# Patient Record
Sex: Female | Born: 1935 | Race: Black or African American | Hispanic: No | Smoking: Never smoker
Health system: Southern US, Community
[De-identification: ages and names within clinical notes are randomized; demographics above are authoritative.]

## PROBLEM LIST (undated history)

## (undated) DIAGNOSIS — C9590 Leukemia, unspecified not having achieved remission: Secondary | ICD-10-CM

## (undated) DIAGNOSIS — I10 Essential (primary) hypertension: Secondary | ICD-10-CM

## (undated) DIAGNOSIS — Z923 Personal history of irradiation: Secondary | ICD-10-CM

## (undated) DIAGNOSIS — Z9221 Personal history of antineoplastic chemotherapy: Secondary | ICD-10-CM

## (undated) DIAGNOSIS — IMO0002 Reserved for concepts with insufficient information to code with codable children: Secondary | ICD-10-CM

## (undated) DIAGNOSIS — F0390 Unspecified dementia without behavioral disturbance: Secondary | ICD-10-CM

## (undated) DIAGNOSIS — K219 Gastro-esophageal reflux disease without esophagitis: Secondary | ICD-10-CM

## (undated) DIAGNOSIS — F419 Anxiety disorder, unspecified: Secondary | ICD-10-CM

## (undated) DIAGNOSIS — M810 Age-related osteoporosis without current pathological fracture: Secondary | ICD-10-CM

## (undated) DIAGNOSIS — J309 Allergic rhinitis, unspecified: Secondary | ICD-10-CM

## (undated) DIAGNOSIS — C50919 Malignant neoplasm of unspecified site of unspecified female breast: Secondary | ICD-10-CM

## (undated) DIAGNOSIS — E785 Hyperlipidemia, unspecified: Secondary | ICD-10-CM

## (undated) DIAGNOSIS — IMO0001 Reserved for inherently not codable concepts without codable children: Secondary | ICD-10-CM

## (undated) DIAGNOSIS — E559 Vitamin D deficiency, unspecified: Secondary | ICD-10-CM

## (undated) HISTORY — DX: Essential (primary) hypertension: I10

## (undated) HISTORY — DX: Anxiety disorder, unspecified: F41.9

## (undated) HISTORY — DX: Gastro-esophageal reflux disease without esophagitis: K21.9

## (undated) HISTORY — DX: Unspecified dementia, unspecified severity, without behavioral disturbance, psychotic disturbance, mood disturbance, and anxiety: F03.90

## (undated) HISTORY — DX: Reserved for inherently not codable concepts without codable children: IMO0001

## (undated) HISTORY — DX: Hyperlipidemia, unspecified: E78.5

## (undated) HISTORY — DX: Leukemia, unspecified not having achieved remission: C95.90

## (undated) HISTORY — DX: Vitamin D deficiency, unspecified: E55.9

## (undated) HISTORY — DX: Reserved for concepts with insufficient information to code with codable children: IMO0002

## (undated) HISTORY — DX: Age-related osteoporosis without current pathological fracture: M81.0

## (undated) HISTORY — DX: Allergic rhinitis, unspecified: J30.9

---

## 1982-09-13 HISTORY — PX: BACK SURGERY: SHX140

## 1997-12-16 ENCOUNTER — Other Ambulatory Visit: Admission: RE | Admit: 1997-12-16 | Discharge: 1997-12-16 | Payer: Self-pay | Admitting: *Deleted

## 1998-02-27 ENCOUNTER — Emergency Department (HOSPITAL_COMMUNITY): Admission: EM | Admit: 1998-02-27 | Discharge: 1998-02-27 | Payer: Self-pay | Admitting: Emergency Medicine

## 1998-08-21 ENCOUNTER — Inpatient Hospital Stay: Admission: EM | Admit: 1998-08-21 | Discharge: 1998-08-25 | Payer: Self-pay | Admitting: Emergency Medicine

## 2001-02-02 ENCOUNTER — Encounter: Admission: RE | Admit: 2001-02-02 | Discharge: 2001-02-02 | Payer: Self-pay | Admitting: Internal Medicine

## 2001-02-02 ENCOUNTER — Encounter: Payer: Self-pay | Admitting: Internal Medicine

## 2001-02-08 ENCOUNTER — Other Ambulatory Visit: Admission: RE | Admit: 2001-02-08 | Discharge: 2001-02-08 | Payer: Self-pay | Admitting: Internal Medicine

## 2001-02-25 ENCOUNTER — Emergency Department (HOSPITAL_COMMUNITY): Admission: EM | Admit: 2001-02-25 | Discharge: 2001-02-26 | Payer: Self-pay | Admitting: Emergency Medicine

## 2001-03-23 ENCOUNTER — Encounter: Payer: Self-pay | Admitting: Internal Medicine

## 2001-03-23 ENCOUNTER — Encounter: Admission: RE | Admit: 2001-03-23 | Discharge: 2001-03-23 | Payer: Self-pay | Admitting: Internal Medicine

## 2001-07-12 ENCOUNTER — Other Ambulatory Visit: Admission: RE | Admit: 2001-07-12 | Discharge: 2001-07-12 | Payer: Self-pay

## 2001-11-28 ENCOUNTER — Ambulatory Visit (HOSPITAL_COMMUNITY): Admission: RE | Admit: 2001-11-28 | Discharge: 2001-11-28 | Payer: Self-pay | Admitting: Gastroenterology

## 2002-09-28 ENCOUNTER — Encounter: Payer: Self-pay | Admitting: Internal Medicine

## 2002-09-28 ENCOUNTER — Encounter: Admission: RE | Admit: 2002-09-28 | Discharge: 2002-09-28 | Payer: Self-pay | Admitting: Internal Medicine

## 2003-05-28 ENCOUNTER — Other Ambulatory Visit: Admission: RE | Admit: 2003-05-28 | Discharge: 2003-05-28 | Payer: Self-pay | Admitting: Internal Medicine

## 2004-02-28 ENCOUNTER — Encounter: Admission: RE | Admit: 2004-02-28 | Discharge: 2004-02-28 | Payer: Self-pay | Admitting: Internal Medicine

## 2005-03-24 ENCOUNTER — Encounter: Admission: RE | Admit: 2005-03-24 | Discharge: 2005-03-24 | Payer: Self-pay | Admitting: Internal Medicine

## 2005-04-09 ENCOUNTER — Encounter: Admission: RE | Admit: 2005-04-09 | Discharge: 2005-04-09 | Payer: Self-pay | Admitting: Internal Medicine

## 2006-04-11 ENCOUNTER — Encounter: Admission: RE | Admit: 2006-04-11 | Discharge: 2006-04-11 | Payer: Self-pay | Admitting: Internal Medicine

## 2007-01-16 ENCOUNTER — Ambulatory Visit (HOSPITAL_COMMUNITY): Admission: RE | Admit: 2007-01-16 | Discharge: 2007-01-16 | Payer: Self-pay | Admitting: Gastroenterology

## 2007-01-16 ENCOUNTER — Encounter (INDEPENDENT_AMBULATORY_CARE_PROVIDER_SITE_OTHER): Payer: Self-pay | Admitting: Specialist

## 2007-05-11 ENCOUNTER — Encounter: Admission: RE | Admit: 2007-05-11 | Discharge: 2007-05-11 | Payer: Self-pay | Admitting: Internal Medicine

## 2008-05-13 ENCOUNTER — Encounter: Admission: RE | Admit: 2008-05-13 | Discharge: 2008-05-13 | Payer: Self-pay | Admitting: Internal Medicine

## 2008-09-13 DIAGNOSIS — C9590 Leukemia, unspecified not having achieved remission: Secondary | ICD-10-CM

## 2008-09-13 HISTORY — DX: Leukemia, unspecified not having achieved remission: C95.90

## 2009-02-12 ENCOUNTER — Ambulatory Visit: Payer: Self-pay | Admitting: Internal Medicine

## 2009-02-17 LAB — MANUAL DIFFERENTIAL
ALC: 5.2 10*3/uL — ABNORMAL HIGH (ref 0.9–3.3)
Band Neutrophils: 15 % — ABNORMAL HIGH (ref 0–10)
LYMPH: 9 % — ABNORMAL LOW (ref 14–49)
MONO: 1 % (ref 0–14)
Myelocytes: 18 % — ABNORMAL HIGH (ref 0–0)
Other Cell: 0 % (ref 0–0)
SEG: 34 % — ABNORMAL LOW (ref 38–77)
Variant Lymph: 0 % (ref 0–0)

## 2009-02-17 LAB — CBC WITH DIFFERENTIAL/PLATELET
HCT: 35 % (ref 34.8–46.6)
HGB: 11.5 g/dL — ABNORMAL LOW (ref 11.6–15.9)
Platelets: 731 10*3/uL — ABNORMAL HIGH (ref 145–400)
WBC: 58.3 10*3/uL (ref 3.9–10.3)

## 2009-02-17 LAB — COMPREHENSIVE METABOLIC PANEL
Albumin: 4.2 g/dL (ref 3.5–5.2)
Alkaline Phosphatase: 110 U/L (ref 39–117)
Glucose, Bld: 91 mg/dL (ref 70–99)
Potassium: 4.4 mEq/L (ref 3.5–5.3)
Sodium: 139 mEq/L (ref 135–145)
Total Protein: 7.1 g/dL (ref 6.0–8.3)

## 2009-02-17 LAB — CHCC SMEAR

## 2009-02-19 LAB — LEUKOCYTE ALKALINE PHOS: Leukocyte Alkaline  Phos Stain: 58 (ref 33–149)

## 2009-02-25 LAB — MANUAL DIFFERENTIAL
ALC: 2.7 10*3/uL (ref 0.9–3.3)
Band Neutrophils: 15 % — ABNORMAL HIGH (ref 0–10)
Blasts: 1 % — ABNORMAL HIGH (ref 0–0)
Myelocytes: 10 % — ABNORMAL HIGH (ref 0–0)
PLT EST: INCREASED
SEG: 45 % (ref 38–77)

## 2009-02-25 LAB — CBC WITH DIFFERENTIAL/PLATELET
HGB: 12.2 g/dL (ref 11.6–15.9)
MCV: 87.4 fL (ref 79.5–101.0)
Platelets: 839 10*3/uL — ABNORMAL HIGH (ref 145–400)
RBC: 4.09 10*6/uL (ref 3.70–5.45)
RDW: 21.5 % — ABNORMAL HIGH (ref 11.2–14.5)
WBC: 53.6 10*3/uL (ref 3.9–10.3)

## 2009-02-25 LAB — COMPREHENSIVE METABOLIC PANEL
AST: 20 U/L (ref 0–37)
Alkaline Phosphatase: 145 U/L — ABNORMAL HIGH (ref 39–117)
BUN: 11 mg/dL (ref 6–23)
Creatinine, Ser: 0.97 mg/dL (ref 0.40–1.20)
Glucose, Bld: 90 mg/dL (ref 70–99)
Total Bilirubin: 0.4 mg/dL (ref 0.3–1.2)

## 2009-02-25 LAB — URIC ACID: Uric Acid, Serum: 6.4 mg/dL (ref 2.4–7.0)

## 2009-02-25 LAB — FERRITIN: Ferritin: 494 ng/mL — ABNORMAL HIGH (ref 10–291)

## 2009-02-26 LAB — BCR/ABL

## 2009-03-06 LAB — MANUAL DIFFERENTIAL
ANC (CHCC manual diff): 41.2 10*3/uL — ABNORMAL HIGH (ref 1.5–6.5)
Band Neutrophils: 12 % — ABNORMAL HIGH (ref 0–10)
Blasts: 4 % — ABNORMAL HIGH (ref 0–0)
EOS: 5 % (ref 0–7)
Other Cell: 0 % (ref 0–0)
PLT EST: INCREASED
PROMYELO: 3 % — ABNORMAL HIGH (ref 0–0)
SEG: 32 % — ABNORMAL LOW (ref 38–77)
nRBC: 4 % — ABNORMAL HIGH (ref 0–0)

## 2009-03-06 LAB — CBC WITH DIFFERENTIAL/PLATELET
HCT: 33.3 % — ABNORMAL LOW (ref 34.8–46.6)
HGB: 11.1 g/dL — ABNORMAL LOW (ref 11.6–15.9)
Platelets: 672 10*3/uL — ABNORMAL HIGH (ref 145–400)
RBC: 3.92 10*6/uL (ref 3.70–5.45)
WBC: 56.5 10*3/uL (ref 3.9–10.3)

## 2009-03-06 LAB — URIC ACID: Uric Acid, Serum: 5.7 mg/dL (ref 2.4–7.0)

## 2009-03-06 LAB — LACTATE DEHYDROGENASE: LDH: 377 U/L — ABNORMAL HIGH (ref 94–250)

## 2009-03-14 ENCOUNTER — Encounter: Payer: Self-pay | Admitting: Internal Medicine

## 2009-03-14 ENCOUNTER — Ambulatory Visit: Payer: Self-pay

## 2009-03-14 ENCOUNTER — Encounter: Payer: Self-pay | Admitting: Cardiology

## 2009-03-18 ENCOUNTER — Ambulatory Visit: Payer: Self-pay | Admitting: Internal Medicine

## 2009-03-20 LAB — MANUAL DIFFERENTIAL
ANC (CHCC manual diff): 63.8 10*3/uL — ABNORMAL HIGH (ref 1.5–6.5)
Band Neutrophils: 5 % (ref 0–10)
Basophil: 9 % — ABNORMAL HIGH (ref 0–2)
Blasts: 1 % — ABNORMAL HIGH (ref 0–0)
EOS: 1 % (ref 0–7)
Metamyelocytes: 14 % — ABNORMAL HIGH (ref 0–0)
Myelocytes: 8 % — ABNORMAL HIGH (ref 0–0)
PLT EST: INCREASED
PROMYELO: 0 % (ref 0–0)
nRBC: 0 % (ref 0–0)

## 2009-03-20 LAB — CBC WITH DIFFERENTIAL/PLATELET
MCH: 29.2 pg (ref 25.1–34.0)
MCHC: 33.2 g/dL (ref 31.5–36.0)
MCV: 88.1 fL (ref 79.5–101.0)
RBC: 4.01 10*6/uL (ref 3.70–5.45)
RDW: 20.2 % — ABNORMAL HIGH (ref 11.2–14.5)

## 2009-03-20 LAB — COMPREHENSIVE METABOLIC PANEL
ALT: 17 U/L (ref 0–35)
AST: 19 U/L (ref 0–37)
Albumin: 4.4 g/dL (ref 3.5–5.2)
Alkaline Phosphatase: 104 U/L (ref 39–117)
BUN: 10 mg/dL (ref 6–23)
Creatinine, Ser: 0.73 mg/dL (ref 0.40–1.20)
Potassium: 2.9 mEq/L — ABNORMAL LOW (ref 3.5–5.3)

## 2009-03-20 LAB — URIC ACID: Uric Acid, Serum: 7.4 mg/dL — ABNORMAL HIGH (ref 2.4–7.0)

## 2009-03-26 LAB — MANUAL DIFFERENTIAL
ANC (CHCC manual diff): 30.9 10*3/uL — ABNORMAL HIGH (ref 1.5–6.5)
Band Neutrophils: 23 % — ABNORMAL HIGH (ref 0–10)
Basophil: 6 % — ABNORMAL HIGH (ref 0–2)
Blasts: 0 % (ref 0–0)
LYMPH: 13 % — ABNORMAL LOW (ref 14–49)
MONO: 2 % (ref 0–14)
Variant Lymph: 0 % (ref 0–0)
nRBC: 4 % — ABNORMAL HIGH (ref 0–0)

## 2009-03-26 LAB — CBC WITH DIFFERENTIAL/PLATELET
HCT: 36.3 % (ref 34.8–46.6)
MCHC: 33.3 g/dL (ref 31.5–36.0)
MCV: 85.4 fL (ref 79.5–101.0)
Platelets: 448 10*3/uL — ABNORMAL HIGH (ref 145–400)

## 2009-03-26 LAB — BASIC METABOLIC PANEL
BUN: 20 mg/dL (ref 6–23)
CO2: 26 mEq/L (ref 19–32)
Chloride: 101 mEq/L (ref 96–112)
Creatinine, Ser: 1.15 mg/dL (ref 0.40–1.20)

## 2009-04-02 LAB — CBC WITH DIFFERENTIAL/PLATELET
BASO%: 6.9 % — ABNORMAL HIGH (ref 0.0–2.0)
EOS%: 3.9 % (ref 0.0–7.0)
HCT: 34.3 % — ABNORMAL LOW (ref 34.8–46.6)
LYMPH%: 19.6 % (ref 14.0–49.7)
MCH: 28.5 pg (ref 25.1–34.0)
MCHC: 33.5 g/dL (ref 31.5–36.0)
NEUT%: 67.5 % (ref 38.4–76.8)
Platelets: 413 10*3/uL — ABNORMAL HIGH (ref 145–400)
RBC: 4.03 10*6/uL (ref 3.70–5.45)

## 2009-04-02 LAB — COMPREHENSIVE METABOLIC PANEL
BUN: 22 mg/dL (ref 6–23)
CO2: 19 mEq/L (ref 19–32)
Creatinine, Ser: 1.24 mg/dL — ABNORMAL HIGH (ref 0.40–1.20)
Glucose, Bld: 100 mg/dL — ABNORMAL HIGH (ref 70–99)
Total Bilirubin: 0.3 mg/dL (ref 0.3–1.2)

## 2009-04-02 LAB — URIC ACID: Uric Acid, Serum: 5.8 mg/dL (ref 2.4–7.0)

## 2009-04-16 LAB — COMPREHENSIVE METABOLIC PANEL
ALT: 13 U/L (ref 0–35)
AST: 18 U/L (ref 0–37)
Albumin: 4.2 g/dL (ref 3.5–5.2)
Alkaline Phosphatase: 106 U/L (ref 39–117)
Chloride: 105 mEq/L (ref 96–112)
Potassium: 3.1 mEq/L — ABNORMAL LOW (ref 3.5–5.3)
Sodium: 139 mEq/L (ref 135–145)
Total Protein: 6.7 g/dL (ref 6.0–8.3)

## 2009-04-16 LAB — MANUAL DIFFERENTIAL
ANC (CHCC manual diff): 2.8 10*3/uL (ref 1.5–6.5)
Basophil: 5 % — ABNORMAL HIGH (ref 0–2)
Blasts: 0 % (ref 0–0)
EOS: 4 % (ref 0–7)
Myelocytes: 0 % (ref 0–0)
PROMYELO: 0 % (ref 0–0)
SEG: 74 % (ref 38–77)

## 2009-04-16 LAB — CBC WITH DIFFERENTIAL/PLATELET
HCT: 32.2 % — ABNORMAL LOW (ref 34.8–46.6)
HGB: 10.8 g/dL — ABNORMAL LOW (ref 11.6–15.9)
MCV: 90.4 fL (ref 79.5–101.0)
Platelets: 128 10*3/uL — ABNORMAL LOW (ref 145–400)
WBC: 3.7 10*3/uL — ABNORMAL LOW (ref 3.9–10.3)

## 2009-04-25 ENCOUNTER — Ambulatory Visit: Payer: Self-pay | Admitting: Internal Medicine

## 2009-04-29 LAB — CBC WITH DIFFERENTIAL/PLATELET
BASO%: 6.1 % — ABNORMAL HIGH (ref 0.0–2.0)
HCT: 33.8 % — ABNORMAL LOW (ref 34.8–46.6)
LYMPH%: 23.5 % (ref 14.0–49.7)
MCH: 29.3 pg (ref 25.1–34.0)
MCHC: 34 g/dL (ref 31.5–36.0)
MCV: 86.2 fL (ref 79.5–101.0)
MONO#: 0.1 10*3/uL (ref 0.1–0.9)
MONO%: 5.1 % (ref 0.0–14.0)
NEUT%: 64.8 % (ref 38.4–76.8)
Platelets: 136 10*3/uL — ABNORMAL LOW (ref 145–400)

## 2009-04-29 LAB — URIC ACID: Uric Acid, Serum: 4.4 mg/dL (ref 2.4–7.0)

## 2009-04-30 LAB — COMPREHENSIVE METABOLIC PANEL
ALT: 14 U/L (ref 0–35)
Alkaline Phosphatase: 127 U/L — ABNORMAL HIGH (ref 39–117)
Creatinine, Ser: 0.95 mg/dL (ref 0.40–1.20)
Glucose, Bld: 101 mg/dL — ABNORMAL HIGH (ref 70–99)
Sodium: 139 mEq/L (ref 135–145)
Total Bilirubin: 0.3 mg/dL (ref 0.3–1.2)
Total Protein: 7 g/dL (ref 6.0–8.3)

## 2009-05-06 LAB — COMPREHENSIVE METABOLIC PANEL
BUN: 6 mg/dL (ref 6–23)
CO2: 22 mEq/L (ref 19–32)
Calcium: 8.4 mg/dL (ref 8.4–10.5)
Creatinine, Ser: 0.88 mg/dL (ref 0.40–1.20)
Glucose, Bld: 88 mg/dL (ref 70–99)
Total Bilirubin: 0.3 mg/dL (ref 0.3–1.2)

## 2009-05-06 LAB — CBC WITH DIFFERENTIAL/PLATELET
Basophils Absolute: 0.1 10*3/uL (ref 0.0–0.1)
Eosinophils Absolute: 0 10*3/uL (ref 0.0–0.5)
HCT: 35.7 % (ref 34.8–46.6)
LYMPH%: 31.5 % (ref 14.0–49.7)
MCV: 87.1 fL (ref 79.5–101.0)
MONO%: 3.8 % (ref 0.0–14.0)
NEUT#: 1 10*3/uL — ABNORMAL LOW (ref 1.5–6.5)
NEUT%: 56.5 % (ref 38.4–76.8)
Platelets: 163 10*3/uL (ref 145–400)
RBC: 4.1 10*6/uL (ref 3.70–5.45)
nRBC: 0 % (ref 0–0)

## 2009-05-13 LAB — COMPREHENSIVE METABOLIC PANEL
ALT: 13 U/L (ref 0–35)
AST: 18 U/L (ref 0–37)
Albumin: 4 g/dL (ref 3.5–5.2)
Alkaline Phosphatase: 113 U/L (ref 39–117)
BUN: 15 mg/dL (ref 6–23)
Calcium: 8.7 mg/dL (ref 8.4–10.5)
Chloride: 105 mEq/L (ref 96–112)
Potassium: 3.7 mEq/L (ref 3.5–5.3)
Sodium: 140 mEq/L (ref 135–145)
Total Protein: 6.7 g/dL (ref 6.0–8.3)

## 2009-05-13 LAB — CBC WITH DIFFERENTIAL/PLATELET
Eosinophils Absolute: 0.1 10*3/uL (ref 0.0–0.5)
HCT: 36.2 % (ref 34.8–46.6)
HGB: 12.2 g/dL (ref 11.6–15.9)
LYMPH%: 23 % (ref 14.0–49.7)
MONO#: 0.1 10*3/uL (ref 0.1–0.9)
NEUT#: 1.6 10*3/uL (ref 1.5–6.5)
NEUT%: 67.8 % (ref 38.4–76.8)
Platelets: 144 10*3/uL — ABNORMAL LOW (ref 145–400)
WBC: 2.3 10*3/uL — ABNORMAL LOW (ref 3.9–10.3)
lymph#: 0.5 10*3/uL — ABNORMAL LOW (ref 0.9–3.3)

## 2009-05-14 ENCOUNTER — Encounter: Admission: RE | Admit: 2009-05-14 | Discharge: 2009-05-14 | Payer: Self-pay | Admitting: Internal Medicine

## 2009-05-22 LAB — CBC WITH DIFFERENTIAL/PLATELET
BASO%: 3.5 % — ABNORMAL HIGH (ref 0.0–2.0)
EOS%: 2.2 % (ref 0.0–7.0)
HCT: 34.3 % — ABNORMAL LOW (ref 34.8–46.6)
LYMPH%: 32 % (ref 14.0–49.7)
MCH: 29.2 pg (ref 25.1–34.0)
MCHC: 33.8 g/dL (ref 31.5–36.0)
MONO%: 6.6 % (ref 0.0–14.0)
NEUT%: 55.7 % (ref 38.4–76.8)
Platelets: 134 10*3/uL — ABNORMAL LOW (ref 145–400)
RBC: 3.97 10*6/uL (ref 3.70–5.45)

## 2009-05-22 LAB — COMPREHENSIVE METABOLIC PANEL
ALT: 14 U/L (ref 0–35)
AST: 19 U/L (ref 0–37)
Alkaline Phosphatase: 101 U/L (ref 39–117)
Creatinine, Ser: 0.96 mg/dL (ref 0.40–1.20)
Sodium: 136 mEq/L (ref 135–145)
Total Bilirubin: 0.3 mg/dL (ref 0.3–1.2)

## 2009-05-22 LAB — LACTATE DEHYDROGENASE: LDH: 200 U/L (ref 94–250)

## 2009-06-03 ENCOUNTER — Ambulatory Visit: Payer: Self-pay | Admitting: Internal Medicine

## 2009-06-05 LAB — CBC WITH DIFFERENTIAL/PLATELET
Basophils Absolute: 0.1 10*3/uL (ref 0.0–0.1)
EOS%: 4.7 % (ref 0.0–7.0)
HGB: 12.5 g/dL (ref 11.6–15.9)
MCH: 29.1 pg (ref 25.1–34.0)
MCHC: 33.9 g/dL (ref 31.5–36.0)
MCV: 86 fL (ref 79.5–101.0)
MONO%: 8.7 % (ref 0.0–14.0)
NEUT%: 31.8 % — ABNORMAL LOW (ref 38.4–76.8)
RDW: 17.4 % — ABNORMAL HIGH (ref 11.2–14.5)

## 2009-06-05 LAB — COMPREHENSIVE METABOLIC PANEL
AST: 18 U/L (ref 0–37)
Alkaline Phosphatase: 91 U/L (ref 39–117)
BUN: 10 mg/dL (ref 6–23)
Creatinine, Ser: 0.94 mg/dL (ref 0.40–1.20)

## 2009-06-23 LAB — CBC WITH DIFFERENTIAL/PLATELET
BASO%: 1.2 % (ref 0.0–2.0)
EOS%: 1.2 % (ref 0.0–7.0)
HCT: 35.9 % (ref 34.8–46.6)
LYMPH%: 34.7 % (ref 14.0–49.7)
MCH: 29.2 pg (ref 25.1–34.0)
MCHC: 33.7 g/dL (ref 31.5–36.0)
MONO#: 0.1 10*3/uL (ref 0.1–0.9)
NEUT%: 58.3 % (ref 38.4–76.8)
RBC: 4.15 10*6/uL (ref 3.70–5.45)
WBC: 2.6 10*3/uL — ABNORMAL LOW (ref 3.9–10.3)
lymph#: 0.9 10*3/uL (ref 0.9–3.3)

## 2009-06-23 LAB — COMPREHENSIVE METABOLIC PANEL
ALT: 13 U/L (ref 0–35)
AST: 19 U/L (ref 0–37)
CO2: 25 mEq/L (ref 19–32)
Chloride: 101 mEq/L (ref 96–112)
Creatinine, Ser: 0.89 mg/dL (ref 0.40–1.20)
Sodium: 139 mEq/L (ref 135–145)
Total Bilirubin: 0.3 mg/dL (ref 0.3–1.2)
Total Protein: 6.9 g/dL (ref 6.0–8.3)

## 2009-07-10 ENCOUNTER — Ambulatory Visit: Payer: Self-pay | Admitting: Internal Medicine

## 2009-07-14 LAB — CBC WITH DIFFERENTIAL/PLATELET
BASO%: 0.4 % (ref 0.0–2.0)
EOS%: 1.1 % (ref 0.0–7.0)
HCT: 35.6 % (ref 34.8–46.6)
LYMPH%: 32.6 % (ref 14.0–49.7)
MCH: 29.3 pg (ref 25.1–34.0)
MCHC: 33.7 g/dL (ref 31.5–36.0)
MONO#: 0.2 10*3/uL (ref 0.1–0.9)
NEUT%: 59.5 % (ref 38.4–76.8)
Platelets: 195 10*3/uL (ref 145–400)
RBC: 4.09 10*6/uL (ref 3.70–5.45)
WBC: 2.6 10*3/uL — ABNORMAL LOW (ref 3.9–10.3)

## 2009-07-14 LAB — COMPREHENSIVE METABOLIC PANEL
ALT: 19 U/L (ref 0–35)
AST: 25 U/L (ref 0–37)
Alkaline Phosphatase: 103 U/L (ref 39–117)
Creatinine, Ser: 1 mg/dL (ref 0.40–1.20)
Sodium: 138 mEq/L (ref 135–145)
Total Bilirubin: 0.6 mg/dL (ref 0.3–1.2)
Total Protein: 7 g/dL (ref 6.0–8.3)

## 2009-08-04 LAB — COMPREHENSIVE METABOLIC PANEL
AST: 19 U/L (ref 0–37)
BUN: 10 mg/dL (ref 6–23)
Calcium: 9.4 mg/dL (ref 8.4–10.5)
Chloride: 104 mEq/L (ref 96–112)
Creatinine, Ser: 0.92 mg/dL (ref 0.40–1.20)
Total Bilirubin: 0.4 mg/dL (ref 0.3–1.2)

## 2009-08-04 LAB — CBC WITH DIFFERENTIAL/PLATELET
BASO%: 0.2 % (ref 0.0–2.0)
Basophils Absolute: 0 10*3/uL (ref 0.0–0.1)
EOS%: 0.2 % (ref 0.0–7.0)
HCT: 36 % (ref 34.8–46.6)
HGB: 12.2 g/dL (ref 11.6–15.9)
LYMPH%: 25.1 % (ref 14.0–49.7)
MCH: 29.3 pg (ref 25.1–34.0)
MCHC: 33.9 g/dL (ref 31.5–36.0)
MCV: 86.3 fL (ref 79.5–101.0)
MONO%: 3.9 % (ref 0.0–14.0)
NEUT%: 70.6 % (ref 38.4–76.8)
Platelets: 217 10*3/uL (ref 145–400)
lymph#: 1 10*3/uL (ref 0.9–3.3)

## 2009-08-29 ENCOUNTER — Ambulatory Visit: Payer: Self-pay | Admitting: Internal Medicine

## 2009-09-02 LAB — CBC WITH DIFFERENTIAL/PLATELET
Basophils Absolute: 0 10*3/uL (ref 0.0–0.1)
Eosinophils Absolute: 0 10*3/uL (ref 0.0–0.5)
HCT: 36.2 % (ref 34.8–46.6)
HGB: 12.1 g/dL (ref 11.6–15.9)
LYMPH%: 43.1 % (ref 14.0–49.7)
MCV: 89.4 fL (ref 79.5–101.0)
MONO#: 0.2 10*3/uL (ref 0.1–0.9)
MONO%: 7.1 % (ref 0.0–14.0)
NEUT#: 1.4 10*3/uL — ABNORMAL LOW (ref 1.5–6.5)
NEUT%: 48.1 % (ref 38.4–76.8)
Platelets: 203 10*3/uL (ref 145–400)
RBC: 4.05 10*6/uL (ref 3.70–5.45)
WBC: 3 10*3/uL — ABNORMAL LOW (ref 3.9–10.3)

## 2009-09-02 LAB — LACTATE DEHYDROGENASE: LDH: 219 U/L (ref 94–250)

## 2009-09-02 LAB — COMPREHENSIVE METABOLIC PANEL
BUN: 14 mg/dL (ref 6–23)
CO2: 26 mEq/L (ref 19–32)
Glucose, Bld: 91 mg/dL (ref 70–99)
Sodium: 140 mEq/L (ref 135–145)
Total Bilirubin: 0.5 mg/dL (ref 0.3–1.2)
Total Protein: 7 g/dL (ref 6.0–8.3)

## 2009-09-26 ENCOUNTER — Ambulatory Visit: Payer: Self-pay | Admitting: Internal Medicine

## 2009-09-30 LAB — COMPREHENSIVE METABOLIC PANEL
ALT: 13 U/L (ref 0–35)
AST: 19 U/L (ref 0–37)
Albumin: 4.7 g/dL (ref 3.5–5.2)
CO2: 23 mEq/L (ref 19–32)
Glucose, Bld: 112 mg/dL — ABNORMAL HIGH (ref 70–99)
Potassium: 3.7 mEq/L (ref 3.5–5.3)

## 2009-09-30 LAB — CBC WITH DIFFERENTIAL/PLATELET
MCHC: 33.9 g/dL (ref 31.5–36.0)
MONO#: 0.2 10*3/uL (ref 0.1–0.9)
MONO%: 7.7 % (ref 0.0–14.0)
NEUT#: 1.6 10*3/uL (ref 1.5–6.5)
NEUT%: 50.2 % (ref 38.4–76.8)
Platelets: 210 10*3/uL (ref 145–400)
RDW: 15.1 % — ABNORMAL HIGH (ref 11.2–14.5)
WBC: 3.1 10*3/uL — ABNORMAL LOW (ref 3.9–10.3)

## 2009-09-30 LAB — LACTATE DEHYDROGENASE: LDH: 214 U/L (ref 94–250)

## 2009-10-27 ENCOUNTER — Other Ambulatory Visit: Admission: RE | Admit: 2009-10-27 | Discharge: 2009-10-27 | Payer: Self-pay | Admitting: Internal Medicine

## 2009-10-30 ENCOUNTER — Ambulatory Visit: Payer: Self-pay | Admitting: Internal Medicine

## 2009-11-03 LAB — CBC WITH DIFFERENTIAL/PLATELET
Basophils Absolute: 0 10*3/uL (ref 0.0–0.1)
LYMPH%: 45.7 % (ref 14.0–49.7)
MCHC: 32.9 g/dL (ref 31.5–36.0)
MCV: 93 fL (ref 79.5–101.0)
MONO%: 5.4 % (ref 0.0–14.0)
NEUT#: 1.3 10*3/uL — ABNORMAL LOW (ref 1.5–6.5)
NEUT%: 47.1 % (ref 38.4–76.8)
Platelets: 217 10*3/uL (ref 145–400)
RBC: 3.86 10*6/uL (ref 3.70–5.45)
WBC: 2.8 10*3/uL — ABNORMAL LOW (ref 3.9–10.3)
lymph#: 1.3 10*3/uL (ref 0.9–3.3)

## 2009-11-03 LAB — COMPREHENSIVE METABOLIC PANEL
AST: 15 U/L (ref 0–37)
Alkaline Phosphatase: 74 U/L (ref 39–117)
BUN: 12 mg/dL (ref 6–23)
Chloride: 104 mEq/L (ref 96–112)
Glucose, Bld: 102 mg/dL — ABNORMAL HIGH (ref 70–99)
Potassium: 3.8 mEq/L (ref 3.5–5.3)
Sodium: 140 mEq/L (ref 135–145)
Total Protein: 6.8 g/dL (ref 6.0–8.3)

## 2009-12-04 ENCOUNTER — Ambulatory Visit: Payer: Self-pay | Admitting: Internal Medicine

## 2009-12-08 LAB — CBC WITH DIFFERENTIAL/PLATELET
BASO%: 0.5 % (ref 0.0–2.0)
EOS%: 1.3 % (ref 0.0–7.0)
Eosinophils Absolute: 0 10*3/uL (ref 0.0–0.5)
HGB: 11.4 g/dL — ABNORMAL LOW (ref 11.6–15.9)
MCH: 32.6 pg (ref 25.1–34.0)
MCV: 96 fL (ref 79.5–101.0)
MONO#: 0.2 10*3/uL (ref 0.1–0.9)
NEUT#: 2.1 10*3/uL (ref 1.5–6.5)
Platelets: 260 10*3/uL (ref 145–400)
RBC: 3.51 10*6/uL — ABNORMAL LOW (ref 3.70–5.45)
RDW: 14.4 % (ref 11.2–14.5)

## 2009-12-08 LAB — COMPREHENSIVE METABOLIC PANEL
Alkaline Phosphatase: 87 U/L (ref 39–117)
CO2: 27 mEq/L (ref 19–32)
Chloride: 107 mEq/L (ref 96–112)
Creatinine, Ser: 0.85 mg/dL (ref 0.40–1.20)
Glucose, Bld: 105 mg/dL — ABNORMAL HIGH (ref 70–99)
Sodium: 143 mEq/L (ref 135–145)
Total Bilirubin: 0.3 mg/dL (ref 0.3–1.2)
Total Protein: 6.3 g/dL (ref 6.0–8.3)

## 2010-01-05 ENCOUNTER — Ambulatory Visit: Payer: Self-pay | Admitting: Internal Medicine

## 2010-01-05 LAB — COMPREHENSIVE METABOLIC PANEL
AST: 21 U/L (ref 0–37)
Albumin: 4.6 g/dL (ref 3.5–5.2)
Alkaline Phosphatase: 85 U/L (ref 39–117)
CO2: 19 mEq/L (ref 19–32)
Chloride: 105 mEq/L (ref 96–112)
Glucose, Bld: 135 mg/dL — ABNORMAL HIGH (ref 70–99)
Total Protein: 7 g/dL (ref 6.0–8.3)

## 2010-01-05 LAB — CBC WITH DIFFERENTIAL/PLATELET
Basophils Absolute: 0 10*3/uL (ref 0.0–0.1)
LYMPH%: 29.1 % (ref 14.0–49.7)
MCH: 30.9 pg (ref 25.1–34.0)
MCHC: 33.6 g/dL (ref 31.5–36.0)
MCV: 91.9 fL (ref 79.5–101.0)
MONO#: 0.2 10*3/uL (ref 0.1–0.9)
MONO%: 4.2 % (ref 0.0–14.0)
lymph#: 1 10*3/uL (ref 0.9–3.3)
nRBC: 0 % (ref 0–0)

## 2010-01-05 LAB — LACTATE DEHYDROGENASE: LDH: 207 U/L (ref 94–250)

## 2010-02-02 LAB — COMPREHENSIVE METABOLIC PANEL
ALT: 11 U/L (ref 0–35)
AST: 17 U/L (ref 0–37)
Alkaline Phosphatase: 71 U/L (ref 39–117)
BUN: 7 mg/dL (ref 6–23)
CO2: 25 mEq/L (ref 19–32)
Chloride: 102 mEq/L (ref 96–112)
Glucose, Bld: 148 mg/dL — ABNORMAL HIGH (ref 70–99)
Potassium: 3.5 mEq/L (ref 3.5–5.3)
Total Bilirubin: 0.4 mg/dL (ref 0.3–1.2)

## 2010-02-02 LAB — CBC WITH DIFFERENTIAL/PLATELET
BASO%: 0.2 % (ref 0.0–2.0)
Basophils Absolute: 0 10*3/uL (ref 0.0–0.1)
EOS%: 1 % (ref 0.0–7.0)
HCT: 34.2 % — ABNORMAL LOW (ref 34.8–46.6)
HGB: 11.6 g/dL (ref 11.6–15.9)
MCHC: 33.8 g/dL (ref 31.5–36.0)
NEUT#: 1.6 10*3/uL (ref 1.5–6.5)
RBC: 3.63 10*6/uL — ABNORMAL LOW (ref 3.70–5.45)
RDW: 14.7 % — ABNORMAL HIGH (ref 11.2–14.5)
WBC: 2.6 10*3/uL — ABNORMAL LOW (ref 3.9–10.3)

## 2010-02-02 LAB — LACTATE DEHYDROGENASE: LDH: 200 U/L (ref 94–250)

## 2010-02-17 ENCOUNTER — Ambulatory Visit: Payer: Self-pay | Admitting: Internal Medicine

## 2010-02-19 LAB — CBC WITH DIFFERENTIAL/PLATELET
Basophils Absolute: 0 10*3/uL (ref 0.0–0.1)
EOS%: 2.8 % (ref 0.0–7.0)
Eosinophils Absolute: 0.1 10*3/uL (ref 0.0–0.5)
HCT: 35 % (ref 34.8–46.6)
HGB: 11.8 g/dL (ref 11.6–15.9)
MCH: 31.7 pg (ref 25.1–34.0)
MCHC: 33.7 g/dL (ref 31.5–36.0)
MONO#: 0.2 10*3/uL (ref 0.1–0.9)
MONO%: 6.2 % (ref 0.0–14.0)
Platelets: 279 10*3/uL (ref 145–400)

## 2010-02-26 LAB — BCR/ABL

## 2010-03-05 LAB — COMPREHENSIVE METABOLIC PANEL
Alkaline Phosphatase: 81 U/L (ref 39–117)
Calcium: 9.2 mg/dL (ref 8.4–10.5)
Chloride: 105 mEq/L (ref 96–112)
Creatinine, Ser: 0.98 mg/dL (ref 0.40–1.20)
Potassium: 4 mEq/L (ref 3.5–5.3)
Sodium: 137 mEq/L (ref 135–145)

## 2010-03-05 LAB — CBC WITH DIFFERENTIAL/PLATELET
BASO%: 0.3 % (ref 0.0–2.0)
EOS%: 1.4 % (ref 0.0–7.0)
HCT: 33.5 % — ABNORMAL LOW (ref 34.8–46.6)
MCH: 32.1 pg (ref 25.1–34.0)
MCHC: 33.9 g/dL (ref 31.5–36.0)
MCV: 94.6 fL (ref 79.5–101.0)
MONO#: 0.2 10*3/uL (ref 0.1–0.9)
NEUT#: 1.1 10*3/uL — ABNORMAL LOW (ref 1.5–6.5)
Platelets: 227 10*3/uL (ref 145–400)
lymph#: 1 10*3/uL (ref 0.9–3.3)

## 2010-03-31 ENCOUNTER — Ambulatory Visit: Payer: Self-pay | Admitting: Internal Medicine

## 2010-04-02 LAB — COMPREHENSIVE METABOLIC PANEL
BUN: 15 mg/dL (ref 6–23)
Chloride: 103 mEq/L (ref 96–112)
Glucose, Bld: 101 mg/dL — ABNORMAL HIGH (ref 70–99)
Potassium: 4.7 mEq/L (ref 3.5–5.3)
Sodium: 139 mEq/L (ref 135–145)
Total Protein: 6.8 g/dL (ref 6.0–8.3)

## 2010-04-02 LAB — CBC WITH DIFFERENTIAL/PLATELET
Basophils Absolute: 0 10*3/uL (ref 0.0–0.1)
EOS%: 3.8 % (ref 0.0–7.0)
MCH: 32.3 pg (ref 25.1–34.0)
MCV: 94.7 fL (ref 79.5–101.0)
MONO%: 4.9 % (ref 0.0–14.0)
NEUT%: 46.5 % (ref 38.4–76.8)
Platelets: 235 10*3/uL (ref 145–400)
RDW: 14 % (ref 11.2–14.5)
WBC: 2.7 10*3/uL — ABNORMAL LOW (ref 3.9–10.3)

## 2010-04-30 ENCOUNTER — Ambulatory Visit: Payer: Self-pay | Admitting: Internal Medicine

## 2010-04-30 LAB — CBC WITH DIFFERENTIAL/PLATELET
Basophils Absolute: 0 10*3/uL (ref 0.0–0.1)
EOS%: 1.9 % (ref 0.0–7.0)
Eosinophils Absolute: 0.1 10*3/uL (ref 0.0–0.5)
HGB: 11.3 g/dL — ABNORMAL LOW (ref 11.6–15.9)
MCHC: 33.1 g/dL (ref 31.5–36.0)
MONO#: 0.2 10*3/uL (ref 0.1–0.9)
NEUT#: 1.1 10*3/uL — ABNORMAL LOW (ref 1.5–6.5)
RBC: 3.7 10*6/uL (ref 3.70–5.45)
RDW: 14 % (ref 11.2–14.5)

## 2010-04-30 LAB — COMPREHENSIVE METABOLIC PANEL
ALT: 14 U/L (ref 0–35)
AST: 20 U/L (ref 0–37)
Albumin: 4.4 g/dL (ref 3.5–5.2)
BUN: 7 mg/dL (ref 6–23)
Calcium: 9.1 mg/dL (ref 8.4–10.5)
Chloride: 106 mEq/L (ref 96–112)
Creatinine, Ser: 1.02 mg/dL (ref 0.40–1.20)
Glucose, Bld: 90 mg/dL (ref 70–99)
Sodium: 143 mEq/L (ref 135–145)

## 2010-04-30 LAB — LACTATE DEHYDROGENASE: LDH: 213 U/L (ref 94–250)

## 2010-05-15 ENCOUNTER — Encounter: Admission: RE | Admit: 2010-05-15 | Discharge: 2010-05-15 | Payer: Self-pay | Admitting: Internal Medicine

## 2010-05-28 LAB — COMPREHENSIVE METABOLIC PANEL
Albumin: 4.4 g/dL (ref 3.5–5.2)
BUN: 12 mg/dL (ref 6–23)
CO2: 26 mEq/L (ref 19–32)
Calcium: 9.9 mg/dL (ref 8.4–10.5)
Chloride: 105 mEq/L (ref 96–112)
Glucose, Bld: 90 mg/dL (ref 70–99)
Potassium: 3.6 mEq/L (ref 3.5–5.3)

## 2010-05-28 LAB — CBC WITH DIFFERENTIAL/PLATELET
Basophils Absolute: 0 10*3/uL (ref 0.0–0.1)
LYMPH%: 42.7 % (ref 14.0–49.7)
MCHC: 33.7 g/dL (ref 31.5–36.0)
NEUT#: 1.1 10*3/uL — ABNORMAL LOW (ref 1.5–6.5)
RBC: 3.63 10*6/uL — ABNORMAL LOW (ref 3.70–5.45)
RDW: 14.4 % (ref 11.2–14.5)

## 2010-07-07 ENCOUNTER — Ambulatory Visit: Payer: Self-pay | Admitting: Internal Medicine

## 2010-07-09 LAB — CBC WITH DIFFERENTIAL/PLATELET
BASO%: 0.9 % (ref 0.0–2.0)
HCT: 33.6 % — ABNORMAL LOW (ref 34.8–46.6)
MCH: 30.5 pg (ref 25.1–34.0)
MCHC: 33 g/dL (ref 31.5–36.0)
NEUT%: 52 % (ref 38.4–76.8)
RBC: 3.64 10*6/uL — ABNORMAL LOW (ref 3.70–5.45)
RDW: 13.7 % (ref 11.2–14.5)
WBC: 3.2 10*3/uL — ABNORMAL LOW (ref 3.9–10.3)

## 2010-07-09 LAB — COMPREHENSIVE METABOLIC PANEL
AST: 17 U/L (ref 0–37)
BUN: 14 mg/dL (ref 6–23)
Calcium: 9 mg/dL (ref 8.4–10.5)
Chloride: 108 mEq/L (ref 96–112)
Glucose, Bld: 103 mg/dL — ABNORMAL HIGH (ref 70–99)
Potassium: 3.7 mEq/L (ref 3.5–5.3)
Total Bilirubin: 0.4 mg/dL (ref 0.3–1.2)
Total Protein: 6.1 g/dL (ref 6.0–8.3)

## 2010-08-05 ENCOUNTER — Ambulatory Visit: Payer: Self-pay | Admitting: Internal Medicine

## 2010-08-10 LAB — CBC WITH DIFFERENTIAL/PLATELET
BASO%: 1.3 % (ref 0.0–2.0)
EOS%: 2.1 % (ref 0.0–7.0)
HCT: 35 % (ref 34.8–46.6)
LYMPH%: 37.4 % (ref 14.0–49.7)
MCH: 32.3 pg (ref 25.1–34.0)
MCHC: 34.4 g/dL (ref 31.5–36.0)
NEUT%: 51.7 % (ref 38.4–76.8)
Platelets: 231 10*3/uL (ref 145–400)

## 2010-08-10 LAB — COMPREHENSIVE METABOLIC PANEL
ALT: 14 U/L (ref 0–35)
AST: 16 U/L (ref 0–37)
CO2: 25 mEq/L (ref 19–32)
Creatinine, Ser: 0.91 mg/dL (ref 0.40–1.20)
Total Bilirubin: 0.4 mg/dL (ref 0.3–1.2)

## 2010-08-10 LAB — LACTATE DEHYDROGENASE: LDH: 194 U/L (ref 94–250)

## 2010-09-04 ENCOUNTER — Ambulatory Visit: Payer: Self-pay | Admitting: Internal Medicine

## 2010-09-09 LAB — CBC WITH DIFFERENTIAL/PLATELET
BASO%: 0.3 % (ref 0.0–2.0)
Basophils Absolute: 0 10*3/uL (ref 0.0–0.1)
Eosinophils Absolute: 0.1 10*3/uL (ref 0.0–0.5)
HCT: 35.5 % (ref 34.8–46.6)
HGB: 11.9 g/dL (ref 11.6–15.9)
LYMPH%: 46.5 % (ref 14.0–49.7)
MONO#: 0.2 10*3/uL (ref 0.1–0.9)
NEUT#: 1.6 10*3/uL (ref 1.5–6.5)
NEUT%: 45.7 % (ref 38.4–76.8)
Platelets: 211 10*3/uL (ref 145–400)
nRBC: 0 % (ref 0–0)

## 2010-09-09 LAB — COMPREHENSIVE METABOLIC PANEL
AST: 20 U/L (ref 0–37)
Alkaline Phosphatase: 84 U/L (ref 39–117)
CO2: 25 mEq/L (ref 19–32)
Calcium: 9.7 mg/dL (ref 8.4–10.5)
Chloride: 103 mEq/L (ref 96–112)
Creatinine, Ser: 1.11 mg/dL (ref 0.40–1.20)
Potassium: 4 mEq/L (ref 3.5–5.3)
Sodium: 138 mEq/L (ref 135–145)
Total Protein: 6.9 g/dL (ref 6.0–8.3)

## 2010-10-04 ENCOUNTER — Encounter: Payer: Self-pay | Admitting: Internal Medicine

## 2010-10-05 ENCOUNTER — Ambulatory Visit: Payer: Self-pay | Admitting: Internal Medicine

## 2010-10-07 LAB — CBC WITH DIFFERENTIAL/PLATELET
BASO%: 0.9 % (ref 0.0–2.0)
EOS%: 5.5 % (ref 0.0–7.0)
Eosinophils Absolute: 0.2 10*3/uL (ref 0.0–0.5)
HGB: 11.6 g/dL (ref 11.6–15.9)
LYMPH%: 42.8 % (ref 14.0–49.7)
MCH: 31.1 pg (ref 25.1–34.0)
MCHC: 33.7 g/dL (ref 31.5–36.0)
MCV: 92.3 fL (ref 79.5–101.0)
MONO#: 0.3 10*3/uL (ref 0.1–0.9)
Platelets: 235 10*3/uL (ref 145–400)
RBC: 3.73 10*6/uL (ref 3.70–5.45)
RDW: 14.8 % — ABNORMAL HIGH (ref 11.2–14.5)
WBC: 3.4 10*3/uL — ABNORMAL LOW (ref 3.9–10.3)
lymph#: 1.5 10*3/uL (ref 0.9–3.3)

## 2010-10-07 LAB — COMPREHENSIVE METABOLIC PANEL
AST: 21 U/L (ref 0–37)
Alkaline Phosphatase: 76 U/L (ref 39–117)
BUN: 9 mg/dL (ref 6–23)
Creatinine, Ser: 0.87 mg/dL (ref 0.40–1.20)
Total Bilirubin: 0.5 mg/dL (ref 0.3–1.2)
Total Protein: 6.3 g/dL (ref 6.0–8.3)

## 2010-10-07 LAB — LACTATE DEHYDROGENASE: LDH: 198 U/L (ref 94–250)

## 2010-11-09 ENCOUNTER — Other Ambulatory Visit: Payer: Self-pay | Admitting: Internal Medicine

## 2010-11-09 ENCOUNTER — Encounter (HOSPITAL_BASED_OUTPATIENT_CLINIC_OR_DEPARTMENT_OTHER): Payer: MEDICARE | Admitting: Internal Medicine

## 2010-11-09 DIAGNOSIS — C9210 Chronic myeloid leukemia, BCR/ABL-positive, not having achieved remission: Secondary | ICD-10-CM

## 2010-11-09 DIAGNOSIS — I10 Essential (primary) hypertension: Secondary | ICD-10-CM

## 2010-11-09 LAB — CBC WITH DIFFERENTIAL/PLATELET
Basophils Absolute: 0 10*3/uL (ref 0.0–0.1)
EOS%: 2.9 % (ref 0.0–7.0)
Eosinophils Absolute: 0.1 10*3/uL (ref 0.0–0.5)
HCT: 34.5 % — ABNORMAL LOW (ref 34.8–46.6)
HGB: 11.5 g/dL — ABNORMAL LOW (ref 11.6–15.9)
MCH: 30.8 pg (ref 25.1–34.0)
NEUT#: 1.9 10*3/uL (ref 1.5–6.5)
NEUT%: 63 % (ref 38.4–76.8)
lymph#: 0.8 10*3/uL — ABNORMAL LOW (ref 0.9–3.3)

## 2010-11-09 LAB — COMPREHENSIVE METABOLIC PANEL
Albumin: 4.2 g/dL (ref 3.5–5.2)
BUN: 12 mg/dL (ref 6–23)
CO2: 27 mEq/L (ref 19–32)
Calcium: 9.7 mg/dL (ref 8.4–10.5)
Chloride: 102 mEq/L (ref 96–112)
Creatinine, Ser: 0.99 mg/dL (ref 0.40–1.20)
Glucose, Bld: 91 mg/dL (ref 70–99)

## 2010-11-09 LAB — LACTATE DEHYDROGENASE: LDH: 180 U/L (ref 94–250)

## 2010-12-08 ENCOUNTER — Other Ambulatory Visit: Payer: Self-pay | Admitting: Internal Medicine

## 2010-12-08 ENCOUNTER — Encounter (HOSPITAL_BASED_OUTPATIENT_CLINIC_OR_DEPARTMENT_OTHER): Payer: MEDICARE | Admitting: Internal Medicine

## 2010-12-08 DIAGNOSIS — I10 Essential (primary) hypertension: Secondary | ICD-10-CM

## 2010-12-08 DIAGNOSIS — C9210 Chronic myeloid leukemia, BCR/ABL-positive, not having achieved remission: Secondary | ICD-10-CM

## 2010-12-08 LAB — CBC WITH DIFFERENTIAL/PLATELET
Basophils Absolute: 0 10*3/uL (ref 0.0–0.1)
Eosinophils Absolute: 0.1 10*3/uL (ref 0.0–0.5)
HGB: 11.1 g/dL — ABNORMAL LOW (ref 11.6–15.9)
NEUT#: 1.5 10*3/uL (ref 1.5–6.5)
RBC: 3.49 10*6/uL — ABNORMAL LOW (ref 3.70–5.45)
RDW: 15.4 % — ABNORMAL HIGH (ref 11.2–14.5)
WBC: 3 10*3/uL — ABNORMAL LOW (ref 3.9–10.3)
lymph#: 1.2 10*3/uL (ref 0.9–3.3)
nRBC: 0 % (ref 0–0)

## 2010-12-08 LAB — COMPREHENSIVE METABOLIC PANEL
ALT: 12 U/L (ref 0–35)
Albumin: 4.4 g/dL (ref 3.5–5.2)
Alkaline Phosphatase: 80 U/L (ref 39–117)
CO2: 24 mEq/L (ref 19–32)
Glucose, Bld: 82 mg/dL (ref 70–99)
Potassium: 3.8 mEq/L (ref 3.5–5.3)
Sodium: 141 mEq/L (ref 135–145)
Total Bilirubin: 0.5 mg/dL (ref 0.3–1.2)
Total Protein: 6.9 g/dL (ref 6.0–8.3)

## 2011-01-11 ENCOUNTER — Encounter (HOSPITAL_BASED_OUTPATIENT_CLINIC_OR_DEPARTMENT_OTHER): Payer: MEDICARE | Admitting: Internal Medicine

## 2011-01-11 ENCOUNTER — Other Ambulatory Visit: Payer: Self-pay | Admitting: Internal Medicine

## 2011-01-11 DIAGNOSIS — I10 Essential (primary) hypertension: Secondary | ICD-10-CM

## 2011-01-11 DIAGNOSIS — C9210 Chronic myeloid leukemia, BCR/ABL-positive, not having achieved remission: Secondary | ICD-10-CM

## 2011-01-11 LAB — CBC WITH DIFFERENTIAL/PLATELET
Eosinophils Absolute: 0 10*3/uL (ref 0.0–0.5)
HCT: 33.4 % — ABNORMAL LOW (ref 34.8–46.6)
LYMPH%: 47.9 % (ref 14.0–49.7)
MCHC: 33.8 g/dL (ref 31.5–36.0)
MCV: 94.2 fL (ref 79.5–101.0)
MONO%: 7 % (ref 0.0–14.0)
NEUT#: 1.1 10*3/uL — ABNORMAL LOW (ref 1.5–6.5)
NEUT%: 43.5 % (ref 38.4–76.8)
Platelets: 223 10*3/uL (ref 145–400)
RBC: 3.55 10*6/uL — ABNORMAL LOW (ref 3.70–5.45)

## 2011-01-11 LAB — COMPREHENSIVE METABOLIC PANEL
Alkaline Phosphatase: 75 U/L (ref 39–117)
Creatinine, Ser: 1.01 mg/dL (ref 0.40–1.20)
Glucose, Bld: 111 mg/dL — ABNORMAL HIGH (ref 70–99)
Sodium: 137 mEq/L (ref 135–145)
Total Bilirubin: 0.4 mg/dL (ref 0.3–1.2)
Total Protein: 6.7 g/dL (ref 6.0–8.3)

## 2011-01-11 LAB — LACTATE DEHYDROGENASE: LDH: 205 U/L (ref 94–250)

## 2011-01-29 NOTE — Op Note (Signed)
Pamela House, [place]               ACCOUNT NO.:  [id number]   MEDICAL RECORD NO.:  [id number]          PATIENT TYPE:  AMB   LOCATION:  ENDO                         FACILITY:  [facility]   PHYSICIAN:  Nelwyn Salisbury, M.D.  DATE OF BIRTH:  Mar 25, 1930   DATE OF PROCEDURE:  01/16/2007  DATE OF DISCHARGE:                               OPERATIVE REPORT   PROCEDURE PERFORMED:  Colonoscopy with snare polypectomy x 1.   ENDOSCOPIST:  Nelwyn Salisbury, M.D.   INSTRUMENT USED:  Pentax video colonoscope.   INDICATIONS FOR PROCEDURE:  77 year old [place] American female with a  history of constipation undergoing screening colonoscopy.  The patient  had a poor prep on January 11, 2007 and she was sent home, as she did not  have liquid stools after consuming a gallon of NuLYTELY and was asked to  stay on a liquid diet for two days prior to this procedure and was given  samples of NuLYTELY from the office as well.  The patient consumed a  bottle of magnesium citrate and a gallon of NuLYTELY the night prior to  the procedure.  The risks and benefits of the procedure, including a 10%  missed rate of cancer and polyps, were discussed with the patient as  well.   PREPROCEDURE PHYSICAL:  VITAL SIGNS:  Stable.  NECK:  Supple.  CHEST:  Clear to auscultation.  S1 and S2 regular.  ABDOMEN:  Soft with normal bowel sounds.   DESCRIPTION OF PROCEDURE:  The patient was placed in the left lateral  decubitus position and sedated with 100 mcg of fentanyl and 10 mg of  Versed given intravenously in slow incremental doses.  Once the patient  was adequately sedated and maintained on low-flow oxygen and continuous  cardiac monitoring, the Pentax video colonoscope was advanced from the  rectum to the cecum with difficulty.  There was a significant amount of  residual stool in the colon.  Multiple washings were done.  A few  scattered diverticula were seen with inspissated stool in some of the  diverticula.  A small  polyp was snared from the rectosigmoid colon. The  terminal ileum was briefly visualized and appeared normal.  The  appendiceal orifice and ileocecal valve were clearly visualized and  photographed.  No other masses or polyps were seen.  Retroflexion in the  rectum revealed no evidence of hemorrhoids.  Mild melanosis coli was  noted throughout the colonic mucosa.   IMPRESSION:  1. Mild melanosis coli.  2. Small sessile polyp snared from the rectosigmoid colon (hot snare      x1).  3. A few scattered diverticula.  4. Significant amount of residual stool in the colon.  Multiple      washings done.  Small lesions could be missed.  5. Normal terminal ileum.   RECOMMENDATIONS:  1. Await pathology results.  2. Avoid nonsteroidals including aspirin for the next 2 weeks.  3. Repeat colonoscopy depending on pathology results.  4. Avoid laxatives like cascara sagrada, senna, etc.  5. Brochures on high fiber diet and diverticulosis have been given to  the patient for education.  6. Outpatient followup as need arises in the future.      Nelwyn Salisbury, M.D.  Electronically Signed     JNM/MEDQ  D:  01/16/2007  T:  01/16/2007  Job:  [id number]   cc:   Wenda Low, MD

## 2011-01-29 NOTE — Procedures (Signed)
[place]  Patient:    Pamela House, Pamela House Visit Number: [id number] MRN: [id number]          Service Type: END Location: ENDO Attending Physician:  [name] Dictated by:   Nelwyn Salisbury, M.D. Proc. Date: 11/28/01 Admit Date:  11/28/2001   CC:         Benita Stabile, M.D.   Procedure Report  DATE OF BIRTH:  December 25, 1928  REFERRING PHYSICIAN:  Benita Stabile, M.D.  PROCEDURE PERFORMED:  Colonoscopy.  ENDOSCOPIST:  Nelwyn Salisbury, M.D.  INSTRUMENT USED:  Olympus video colonoscope.  INDICATIONS FOR PROCEDURE:  Personal history of adenomatous polyps in a 77 year old African-American female.  Rule out recurrent polyps.  PREPROCEDURE PREPARATION:  Informed consent was procured from the patient. The patient was fasted for eight hours prior to the procedure and prepped with a bottle of magnesium citrate and a gallon of NuLytely the night prior to the procedure.  PREPROCEDURE PHYSICAL:  The patient had stable vital signs.  Neck supple. Chest clear to auscultation.  S1, S2 regular.  Abdomen soft with normal bowel sounds.  DESCRIPTION OF PROCEDURE:  The patient was placed in the left lateral decubitus position and sedated with 30 mg of Demerol and 3 mg of Versed intravenously. Once the patient was adequately sedated and maintained on low-flow oxygen and continuous cardiac monitoring, the Olympus video colonoscope was advanced from the rectum to the cecum with extreme difficulty. The patient had a very tortuous colon with evidence of melanosis coli throughout the colonic mucosa.  There were a few scattered diverticula seen. The patient was complete up to the cecum.  The appendiceal orifice would not be visualized.  There was a large amount of solid stool at the cecal base. There was some solid stool seen at the hepatic flexure and therefore visualization was not complete.  The rest of the colonic mucosa appeared healthy and without  lesions except for a few scattered diverticula as mentioned above.  The patient tolerated the procedure well without complication.  IMPRESSION: 1. No evidence of masses or polyps. 2. Some residual stool in the colon.  Small lesions could have been missed. 3. Cecal base not visualized. 4. Evidence of melanosis coli throughout the colonic mucosa.  RECOMMENDATIONS: 1. The patient has been advised to undergo repeat colorectal cancer    screening in the next five years unless she were to develop any abnormal    symptoms in the interim. 2. A high fiber diet has been recommended for her with liberal fluid intake. 3. Stool softeners are to be used as needed. 4. Laxatives are to be avoided.Dictated by:   Nelwyn Salisbury, M.D.  Attending Physician:  [name] DD:  11/28/01 TD:  11/28/01 Job: [id number]

## 2011-02-09 ENCOUNTER — Other Ambulatory Visit: Payer: Self-pay | Admitting: Internal Medicine

## 2011-02-09 ENCOUNTER — Encounter (HOSPITAL_BASED_OUTPATIENT_CLINIC_OR_DEPARTMENT_OTHER): Payer: Medicare Other | Admitting: Internal Medicine

## 2011-02-09 DIAGNOSIS — D72829 Elevated white blood cell count, unspecified: Secondary | ICD-10-CM

## 2011-02-09 DIAGNOSIS — C9210 Chronic myeloid leukemia, BCR/ABL-positive, not having achieved remission: Secondary | ICD-10-CM

## 2011-02-09 DIAGNOSIS — I10 Essential (primary) hypertension: Secondary | ICD-10-CM

## 2011-02-09 LAB — CBC WITH DIFFERENTIAL/PLATELET
Basophils Absolute: 0 10*3/uL (ref 0.0–0.1)
Eosinophils Absolute: 0 10*3/uL (ref 0.0–0.5)
HCT: 34.2 % — ABNORMAL LOW (ref 34.8–46.6)
HGB: 11.5 g/dL — ABNORMAL LOW (ref 11.6–15.9)
LYMPH%: 41.4 % (ref 14.0–49.7)
MCHC: 33.7 g/dL (ref 31.5–36.0)
MONO#: 0.2 10*3/uL (ref 0.1–0.9)
NEUT#: 0.8 10*3/uL — ABNORMAL LOW (ref 1.5–6.5)
NEUT%: 46.4 % (ref 38.4–76.8)
Platelets: 261 10*3/uL (ref 145–400)
WBC: 1.8 10*3/uL — ABNORMAL LOW (ref 3.9–10.3)
lymph#: 0.8 10*3/uL — ABNORMAL LOW (ref 0.9–3.3)

## 2011-02-09 LAB — COMPREHENSIVE METABOLIC PANEL
CO2: 25 mEq/L (ref 19–32)
Calcium: 10.3 mg/dL (ref 8.4–10.5)
Chloride: 105 mEq/L (ref 96–112)
Creatinine, Ser: 0.95 mg/dL (ref 0.40–1.20)
Glucose, Bld: 123 mg/dL — ABNORMAL HIGH (ref 70–99)
Total Bilirubin: 0.3 mg/dL (ref 0.3–1.2)

## 2011-02-09 LAB — LACTATE DEHYDROGENASE: LDH: 204 U/L (ref 94–250)

## 2011-03-10 ENCOUNTER — Other Ambulatory Visit: Payer: Self-pay | Admitting: Internal Medicine

## 2011-03-10 ENCOUNTER — Encounter (HOSPITAL_BASED_OUTPATIENT_CLINIC_OR_DEPARTMENT_OTHER): Payer: Medicare Other | Admitting: Internal Medicine

## 2011-03-10 DIAGNOSIS — I10 Essential (primary) hypertension: Secondary | ICD-10-CM

## 2011-03-10 DIAGNOSIS — C9210 Chronic myeloid leukemia, BCR/ABL-positive, not having achieved remission: Secondary | ICD-10-CM

## 2011-03-10 LAB — CBC WITH DIFFERENTIAL/PLATELET
BASO%: 0.4 % (ref 0.0–2.0)
EOS%: 0.4 % (ref 0.0–7.0)
HCT: 33.2 % — ABNORMAL LOW (ref 34.8–46.6)
MCHC: 33.4 g/dL (ref 31.5–36.0)
MONO#: 0.2 10*3/uL (ref 0.1–0.9)
NEUT%: 44.9 % (ref 38.4–76.8)
RDW: 13.5 % (ref 11.2–14.5)
WBC: 2.7 10*3/uL — ABNORMAL LOW (ref 3.9–10.3)
lymph#: 1.3 10*3/uL (ref 0.9–3.3)

## 2011-03-10 LAB — COMPREHENSIVE METABOLIC PANEL
ALT: 15 U/L (ref 0–35)
AST: 22 U/L (ref 0–37)
Albumin: 4.2 g/dL (ref 3.5–5.2)
CO2: 25 mEq/L (ref 19–32)
Calcium: 9.3 mg/dL (ref 8.4–10.5)
Chloride: 105 mEq/L (ref 96–112)
Creatinine, Ser: 0.9 mg/dL (ref 0.50–1.10)
Potassium: 3.5 mEq/L (ref 3.5–5.3)
Sodium: 141 mEq/L (ref 135–145)
Total Protein: 6.5 g/dL (ref 6.0–8.3)

## 2011-03-10 LAB — LACTATE DEHYDROGENASE: LDH: 210 U/L (ref 94–250)

## 2011-04-27 ENCOUNTER — Other Ambulatory Visit: Payer: Self-pay | Admitting: Internal Medicine

## 2011-04-27 ENCOUNTER — Encounter (HOSPITAL_BASED_OUTPATIENT_CLINIC_OR_DEPARTMENT_OTHER): Payer: Medicare Other | Admitting: Internal Medicine

## 2011-04-27 DIAGNOSIS — I10 Essential (primary) hypertension: Secondary | ICD-10-CM

## 2011-04-27 DIAGNOSIS — C9210 Chronic myeloid leukemia, BCR/ABL-positive, not having achieved remission: Secondary | ICD-10-CM

## 2011-04-27 LAB — CBC WITH DIFFERENTIAL/PLATELET
BASO%: 0.3 % (ref 0.0–2.0)
Basophils Absolute: 0 10*3/uL (ref 0.0–0.1)
EOS%: 0.9 % (ref 0.0–7.0)
MCH: 30.3 pg (ref 25.1–34.0)
MCHC: 32.9 g/dL (ref 31.5–36.0)
MCV: 91.9 fL (ref 79.5–101.0)
MONO%: 4.8 % (ref 0.0–14.0)
RBC: 3.57 10*6/uL — ABNORMAL LOW (ref 3.70–5.45)
RDW: 14 % (ref 11.2–14.5)
lymph#: 1.5 10*3/uL (ref 0.9–3.3)

## 2011-04-27 LAB — COMPREHENSIVE METABOLIC PANEL
AST: 21 U/L (ref 0–37)
Alkaline Phosphatase: 87 U/L (ref 39–117)
BUN: 11 mg/dL (ref 6–23)
Creatinine, Ser: 0.93 mg/dL (ref 0.50–1.10)
Glucose, Bld: 91 mg/dL (ref 70–99)
Potassium: 3.6 mEq/L (ref 3.5–5.3)
Total Bilirubin: 0.3 mg/dL (ref 0.3–1.2)

## 2011-04-28 ENCOUNTER — Other Ambulatory Visit: Payer: Self-pay | Admitting: Internal Medicine

## 2011-04-28 DIAGNOSIS — Z1231 Encounter for screening mammogram for malignant neoplasm of breast: Secondary | ICD-10-CM

## 2011-05-27 ENCOUNTER — Ambulatory Visit
Admission: RE | Admit: 2011-05-27 | Discharge: 2011-05-27 | Disposition: A | Discharge status: Home / Self Care | Payer: Medicare Other | Source: Ambulatory Visit | Attending: Internal Medicine | Admitting: Internal Medicine

## 2011-05-27 DIAGNOSIS — Z1231 Encounter for screening mammogram for malignant neoplasm of breast: Secondary | ICD-10-CM

## 2011-06-02 ENCOUNTER — Other Ambulatory Visit: Payer: Self-pay | Admitting: Internal Medicine

## 2011-06-02 ENCOUNTER — Encounter (HOSPITAL_BASED_OUTPATIENT_CLINIC_OR_DEPARTMENT_OTHER): Payer: Medicare Other | Admitting: Internal Medicine

## 2011-06-02 DIAGNOSIS — C9210 Chronic myeloid leukemia, BCR/ABL-positive, not having achieved remission: Secondary | ICD-10-CM

## 2011-06-02 DIAGNOSIS — I10 Essential (primary) hypertension: Secondary | ICD-10-CM

## 2011-06-02 LAB — COMPREHENSIVE METABOLIC PANEL
CO2: 30 mEq/L (ref 19–32)
Calcium: 9.5 mg/dL (ref 8.4–10.5)
Chloride: 102 mEq/L (ref 96–112)
Creatinine, Ser: 0.77 mg/dL (ref 0.50–1.10)
Glucose, Bld: 95 mg/dL (ref 70–99)
Sodium: 141 mEq/L (ref 135–145)
Total Bilirubin: 0.1 mg/dL — ABNORMAL LOW (ref 0.3–1.2)
Total Protein: 6.8 g/dL (ref 6.0–8.3)

## 2011-06-02 LAB — CBC WITH DIFFERENTIAL/PLATELET
BASO%: 0.6 % (ref 0.0–2.0)
EOS%: 1.9 % (ref 0.0–7.0)
MCH: 30.3 pg (ref 25.1–34.0)
MCHC: 33.5 g/dL (ref 31.5–36.0)
NEUT%: 32.1 % — ABNORMAL LOW (ref 38.4–76.8)
RDW: 14.2 % (ref 11.2–14.5)
lymph#: 1.9 10*3/uL (ref 0.9–3.3)

## 2011-06-02 LAB — LACTATE DEHYDROGENASE: LDH: 241 U/L (ref 94–250)

## 2011-06-09 LAB — BCR/ABL

## 2011-06-14 ENCOUNTER — Encounter (HOSPITAL_BASED_OUTPATIENT_CLINIC_OR_DEPARTMENT_OTHER): Payer: Medicare Other | Admitting: Internal Medicine

## 2011-06-14 DIAGNOSIS — I10 Essential (primary) hypertension: Secondary | ICD-10-CM

## 2011-06-14 DIAGNOSIS — C9210 Chronic myeloid leukemia, BCR/ABL-positive, not having achieved remission: Secondary | ICD-10-CM

## 2011-07-12 ENCOUNTER — Encounter (HOSPITAL_BASED_OUTPATIENT_CLINIC_OR_DEPARTMENT_OTHER): Payer: Medicare Other | Admitting: Internal Medicine

## 2011-07-12 ENCOUNTER — Other Ambulatory Visit: Payer: Self-pay | Admitting: Internal Medicine

## 2011-07-12 DIAGNOSIS — C9210 Chronic myeloid leukemia, BCR/ABL-positive, not having achieved remission: Secondary | ICD-10-CM

## 2011-07-12 DIAGNOSIS — I10 Essential (primary) hypertension: Secondary | ICD-10-CM

## 2011-07-12 LAB — CBC WITH DIFFERENTIAL/PLATELET
BASO%: 0.4 % (ref 0.0–2.0)
Basophils Absolute: 0 10*3/uL (ref 0.0–0.1)
EOS%: 1 % (ref 0.0–7.0)
HGB: 11.6 g/dL (ref 11.6–15.9)
MCH: 31.6 pg (ref 25.1–34.0)
MCV: 94.4 fL (ref 79.5–101.0)
MONO%: 6.1 % (ref 0.0–14.0)
RBC: 3.68 10*6/uL — ABNORMAL LOW (ref 3.70–5.45)
RDW: 14.1 % (ref 11.2–14.5)
lymph#: 1.3 10*3/uL (ref 0.9–3.3)

## 2011-07-12 LAB — LACTATE DEHYDROGENASE: LDH: 254 U/L — ABNORMAL HIGH (ref 94–250)

## 2011-08-04 ENCOUNTER — Other Ambulatory Visit: Payer: Medicare Other | Admitting: Lab

## 2011-08-11 ENCOUNTER — Other Ambulatory Visit: Payer: Medicare Other | Admitting: Lab

## 2011-08-26 ENCOUNTER — Other Ambulatory Visit: Payer: Self-pay | Admitting: Internal Medicine

## 2011-08-26 ENCOUNTER — Other Ambulatory Visit (HOSPITAL_BASED_OUTPATIENT_CLINIC_OR_DEPARTMENT_OTHER): Payer: Medicare Other | Admitting: Lab

## 2011-08-26 DIAGNOSIS — I10 Essential (primary) hypertension: Secondary | ICD-10-CM

## 2011-08-26 DIAGNOSIS — C9210 Chronic myeloid leukemia, BCR/ABL-positive, not having achieved remission: Secondary | ICD-10-CM

## 2011-08-26 DIAGNOSIS — D72829 Elevated white blood cell count, unspecified: Secondary | ICD-10-CM

## 2011-08-26 LAB — CBC WITH DIFFERENTIAL/PLATELET
BASO%: 0.6 % (ref 0.0–2.0)
Eosinophils Absolute: 0.1 10*3/uL (ref 0.0–0.5)
LYMPH%: 49 % (ref 14.0–49.7)
MCHC: 33 g/dL (ref 31.5–36.0)
MONO#: 0.2 10*3/uL (ref 0.1–0.9)
NEUT#: 1.2 10*3/uL — ABNORMAL LOW (ref 1.5–6.5)
Platelets: 209 10*3/uL (ref 145–400)
RBC: 3.77 10*6/uL (ref 3.70–5.45)
RDW: 14.5 % (ref 11.2–14.5)
WBC: 2.9 10*3/uL — ABNORMAL LOW (ref 3.9–10.3)
lymph#: 1.4 10*3/uL (ref 0.9–3.3)

## 2011-08-26 LAB — LACTATE DEHYDROGENASE: LDH: 250 U/L (ref 94–250)

## 2011-09-15 ENCOUNTER — Encounter: Payer: Self-pay | Admitting: *Deleted

## 2011-09-15 ENCOUNTER — Other Ambulatory Visit: Payer: Self-pay | Admitting: *Deleted

## 2011-09-15 MED ORDER — IMATINIB MESYLATE 400 MG PO TABS: 400.0000 mg | ORAL_TABLET | Freq: Every day | ORAL | Status: AC

## 2011-09-15 NOTE — Progress Notes (Signed)
[facility] faxed refill request for gleevec.  Request to MD for review.

## 2011-09-29 ENCOUNTER — Ambulatory Visit: Payer: Medicare Other | Admitting: Internal Medicine

## 2011-10-06 ENCOUNTER — Other Ambulatory Visit: Payer: Medicare Other | Admitting: Lab

## 2011-10-06 ENCOUNTER — Ambulatory Visit: Payer: Medicare Other | Admitting: Internal Medicine

## 2011-10-18 ENCOUNTER — Telehealth: Payer: Self-pay | Admitting: Internal Medicine

## 2011-10-18 NOTE — Telephone Encounter (Signed)
pt called to r.s her 1/23 appt to 2/18    aom

## 2011-10-29 ENCOUNTER — Other Ambulatory Visit: Payer: Self-pay | Admitting: *Deleted

## 2011-10-29 DIAGNOSIS — C9210 Chronic myeloid leukemia, BCR/ABL-positive, not having achieved remission: Secondary | ICD-10-CM

## 2011-11-01 ENCOUNTER — Ambulatory Visit (HOSPITAL_BASED_OUTPATIENT_CLINIC_OR_DEPARTMENT_OTHER): Payer: Medicare Other | Admitting: Internal Medicine

## 2011-11-01 ENCOUNTER — Other Ambulatory Visit (HOSPITAL_BASED_OUTPATIENT_CLINIC_OR_DEPARTMENT_OTHER): Payer: Medicare Other | Admitting: Lab

## 2011-11-01 ENCOUNTER — Telehealth: Payer: Self-pay | Admitting: Internal Medicine

## 2011-11-01 VITALS — BP 147/58 | HR 68 | Temp 97.8°F | Ht 63.0 in | Wt 154.7 lb

## 2011-11-01 DIAGNOSIS — C9210 Chronic myeloid leukemia, BCR/ABL-positive, not having achieved remission: Secondary | ICD-10-CM

## 2011-11-01 LAB — CBC WITH DIFFERENTIAL/PLATELET
BASO%: 0.1 % (ref 0.0–2.0)
EOS%: 2.5 % (ref 0.0–7.0)
LYMPH%: 55.4 % — ABNORMAL HIGH (ref 14.0–49.7)
MCH: 31.2 pg (ref 25.1–34.0)
MCHC: 33.5 g/dL (ref 31.5–36.0)
MCV: 93.3 fL (ref 79.5–101.0)
MONO%: 6.6 % (ref 0.0–14.0)
NEUT#: 1.1 10*3/uL — ABNORMAL LOW (ref 1.5–6.5)
Platelets: 237 10*3/uL (ref 145–400)
RBC: 3.5 10*6/uL — ABNORMAL LOW (ref 3.70–5.45)
RDW: 15.1 % — ABNORMAL HIGH (ref 11.2–14.5)

## 2011-11-01 LAB — COMPREHENSIVE METABOLIC PANEL WITH GFR
ALT: 10 U/L (ref 0–35)
AST: 18 U/L (ref 0–37)
Albumin: 4.1 g/dL (ref 3.5–5.2)
Alkaline Phosphatase: 88 U/L (ref 39–117)
BUN: 17 mg/dL (ref 6–23)
CO2: 25 meq/L (ref 19–32)
Calcium: 9.4 mg/dL (ref 8.4–10.5)
Chloride: 103 meq/L (ref 96–112)
Creatinine, Ser: 0.97 mg/dL (ref 0.50–1.10)
Glucose, Bld: 100 mg/dL — ABNORMAL HIGH (ref 70–99)
Potassium: 3.9 meq/L (ref 3.5–5.3)
Sodium: 138 meq/L (ref 135–145)
Total Bilirubin: 0.3 mg/dL (ref 0.3–1.2)
Total Protein: 6.6 g/dL (ref 6.0–8.3)

## 2011-11-01 NOTE — Progress Notes (Signed)
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  CURRENT THERAPY:  Gleevec 400 mg p.o. daily, except Monday and Thursday secondary to significant neutropenia with the daily dose.  INTERVAL HISTORY: Pamela House 78 y.o. female returns to the clinic today for three-month followup visit accompanied her daughter. The patient has no complaints today except for a chest congestion a residual of flu symptoms in the last few weeks. She was seen by her primary care physician Dr. Deforest Hoyles. The patient denied having any significant weight loss or night sweats. She has no chest pain or shortness of breath. No bleeding issues. She has repeat CBC performed earlier today and she is here for evaluation and discussion of her lab results.  ALLERGIES:   has no known allergies.  MEDICATIONS:  Current Outpatient Prescriptions  Medication Sig Dispense Refill  . acetaminophen (TYLENOL) 325 MG tablet Take 650 mg by mouth every 6 (six) hours as needed.      . ALPRAZolam (XANAX) 0.5 MG tablet Take 0.5 mg by mouth 2 (two) times daily as needed.      Marland Kitchen aspirin 81 MG tablet Take 81 mg by mouth daily. 2-3 times weekly      . bifidobacterium infantis (ALIGN) capsule Take 1 capsule by mouth daily.      Marland Kitchen dextromethorphan (DELSYM) 30 MG/5ML liquid Take 60 mg by mouth as needed.      . diltiazem (CARDIZEM CD) 240 MG 24 hr capsule Take 240 mg by mouth daily.      Marland Kitchen donepezil (ARICEPT) 5 MG tablet Take 5 mg by mouth at bedtime.      . ergocalciferol (VITAMIN D2) 50000 UNITS capsule Take 50,000 Units by mouth every 30 (thirty) days.      . furosemide (LASIX) 20 MG tablet Take 20 mg by mouth daily.      Marland Kitchen HYDROcodone-homatropine (HYCODAN) 5-1.5 MG/5ML syrup Take by mouth at bedtime as needed.      . imatinib (GLEEVEC) 400 MG  tablet Take 400 mg by mouth daily. Take with meals and large glass of water.Caution:Chemotherapy.      Marland Kitchen omeprazole (PRILOSEC) 20 MG capsule Take 20 mg by mouth 2 (two) times daily.      . potassium chloride (K-DUR) 10 MEQ tablet Take 10 mEq by mouth daily.      . promethazine (PHENERGAN) 25 MG tablet Take 25 mg by mouth every 6 (six) hours as needed. q 4-6h prn      . psyllium (METAMUCIL) 58.6 % powder Take 1 packet by mouth 3 (three) times daily.      . ramipril (ALTACE) 5 MG capsule Take 5 mg by mouth daily.        REVIEW OF SYSTEMS:  A comprehensive review of systems was negative except for: Constitutional: positive for fatigue Respiratory: positive for chronic bronchitis and cough   PHYSICAL EXAMINATION: General appearance: alert, cooperative, fatigued and no distress Neck: no adenopathy Lymph nodes: Cervical, supraclavicular, and axillary nodes normal. Resp: clear to auscultation bilaterally Cardio: regular rate and rhythm, S1, S2 normal, no murmur, click, rub or gallop GI: soft, non-tender; bowel sounds normal; no masses,  no organomegaly Extremities: extremities normal, atraumatic, no cyanosis or edema  ECOG PERFORMANCE STATUS: 1 - Symptomatic but completely ambulatory  Blood pressure 147/58, pulse 68, temperature 97.8 F (  36.6 C), temperature source Oral, height _0  (1.6 m), weight 154 lb 11.2 oz (70.171 kg).  LABORATORY DATA: Lab Results  Component Value Date   WBC 3.0* 11/01/2011   HGB 10.9* 11/01/2011   HCT 32.6* 11/01/2011   MCV 93.3 11/01/2011   PLT 237 11/01/2011      Chemistry      Component Value Date/Time   NA 138 11/01/2011 1038   K 3.9 11/01/2011 1038   CL 103 11/01/2011 1038   CO2 25 11/01/2011 1038   BUN 17 11/01/2011 1038   CREATININE 0.97 11/01/2011 1038      Component Value Date/Time   CALCIUM 9.4 11/01/2011 1038   ALKPHOS 88 11/01/2011 1038   AST 18 11/01/2011 1038   ALT 10 11/01/2011 1038   BILITOT 0.3 11/01/2011 1038       RADIOGRAPHIC STUDIES: No  results found.  ASSESSMENT: This is a very pleasant 78 years old [place] American female with a history of chronic myeloid leukemia currently on treatment with Gleevec. The patient is doing fine and tolerating her treatment well. She has no evidence for morphologic disease progression. I discussed the lab result with the patient and her daughter.  PLAN: I recommended for her to continue on the same treatment with the same dose. I would see her back for followup visit in 3 months with repeat CBC, comprehensive metabolic panel and LDH. She was advised to call me immediately if she has any concerning symptoms in the interval.  All questions were answered. The patient knows to call the clinic with any problems, questions or concerns. We can certainly see the patient much sooner if necessary.

## 2011-11-01 NOTE — Telephone Encounter (Signed)
appt made and printed for 01/31/12 aom

## 2011-11-16 ENCOUNTER — Other Ambulatory Visit: Payer: Self-pay | Admitting: Internal Medicine

## 2011-11-16 DIAGNOSIS — C9210 Chronic myeloid leukemia, BCR/ABL-positive, not having achieved remission: Secondary | ICD-10-CM

## 2011-11-16 MED ORDER — IMATINIB MESYLATE 400 MG PO TABS: 400.0000 mg | ORAL_TABLET | Freq: Every day | ORAL | Status: DC

## 2011-11-16 NOTE — Telephone Encounter (Signed)
Refill request sent to dr [facility]

## 2011-12-17 ENCOUNTER — Other Ambulatory Visit: Payer: Self-pay | Admitting: *Deleted

## 2011-12-17 DIAGNOSIS — C9210 Chronic myeloid leukemia, BCR/ABL-positive, not having achieved remission: Secondary | ICD-10-CM

## 2011-12-17 MED ORDER — IMATINIB MESYLATE 400 MG PO TABS: 400.0000 mg | ORAL_TABLET | Freq: Every day | ORAL | Status: DC

## 2012-01-28 ENCOUNTER — Other Ambulatory Visit: Payer: Self-pay | Admitting: Internal Medicine

## 2012-01-28 DIAGNOSIS — Z1231 Encounter for screening mammogram for malignant neoplasm of breast: Secondary | ICD-10-CM

## 2012-01-31 ENCOUNTER — Other Ambulatory Visit: Payer: Self-pay | Admitting: Medical Oncology

## 2012-01-31 ENCOUNTER — Telehealth: Payer: Self-pay | Admitting: Internal Medicine

## 2012-01-31 ENCOUNTER — Ambulatory Visit: Payer: Medicare Other | Admitting: Internal Medicine

## 2012-01-31 ENCOUNTER — Other Ambulatory Visit: Payer: Medicare Other | Admitting: Lab

## 2012-01-31 NOTE — Telephone Encounter (Signed)
Move appts from 5/20 to 5/30 per db and pt aware per db   aom

## 2012-02-10 ENCOUNTER — Ambulatory Visit (HOSPITAL_BASED_OUTPATIENT_CLINIC_OR_DEPARTMENT_OTHER): Payer: Medicare Other | Admitting: Internal Medicine

## 2012-02-10 ENCOUNTER — Other Ambulatory Visit (HOSPITAL_BASED_OUTPATIENT_CLINIC_OR_DEPARTMENT_OTHER): Payer: Medicare Other | Admitting: Lab

## 2012-02-10 ENCOUNTER — Telehealth: Payer: Self-pay | Admitting: Internal Medicine

## 2012-02-10 VITALS — BP 172/79 | HR 67 | Temp 97.4°F | Ht 63.0 in | Wt 153.3 lb

## 2012-02-10 DIAGNOSIS — C9210 Chronic myeloid leukemia, BCR/ABL-positive, not having achieved remission: Secondary | ICD-10-CM

## 2012-02-10 LAB — CBC WITH DIFFERENTIAL/PLATELET
Basophils Absolute: 0 10*3/uL (ref 0.0–0.1)
Eosinophils Absolute: 0 10*3/uL (ref 0.0–0.5)
HGB: 11.1 g/dL — ABNORMAL LOW (ref 11.6–15.9)
MCV: 93 fL (ref 79.5–101.0)
MONO#: 0.2 10*3/uL (ref 0.1–0.9)
NEUT#: 1.7 10*3/uL (ref 1.5–6.5)
RBC: 3.65 10*6/uL — ABNORMAL LOW (ref 3.70–5.45)
RDW: 13.8 % (ref 11.2–14.5)
WBC: 3.3 10*3/uL — ABNORMAL LOW (ref 3.9–10.3)
lymph#: 1.3 10*3/uL (ref 0.9–3.3)
nRBC: 0 % (ref 0–0)

## 2012-02-10 LAB — COMPREHENSIVE METABOLIC PANEL
ALT: 12 U/L (ref 0–35)
Albumin: 4.4 g/dL (ref 3.5–5.2)
CO2: 29 mEq/L (ref 19–32)
Chloride: 103 mEq/L (ref 96–112)
Glucose, Bld: 95 mg/dL (ref 70–99)
Potassium: 3.5 mEq/L (ref 3.5–5.3)
Sodium: 139 mEq/L (ref 135–145)
Total Protein: 6.4 g/dL (ref 6.0–8.3)

## 2012-02-10 LAB — LACTATE DEHYDROGENASE: LDH: 196 U/L (ref 94–250)

## 2012-02-10 NOTE — Telephone Encounter (Signed)
Gave pt appt calendar for September 2013 lab before MD

## 2012-02-10 NOTE — Progress Notes (Signed)
Pamela House Telephone:([phone]   Fax:(336) [place], MD, MD [address], [place], [address] [place] [address]  PRINCIPAL DIAGNOSIS: Chronic myeloid leukemia diagnosed in June 2010.   CURRENT THERAPY: Gleevec 400 mg p.o. daily, except Monday and Thursday secondary to significant neutropenia with the daily dose.  INTERVAL HISTORY: Pamela House 78 y.o. female returns to the clinic today for three-month followup visit. The patient has no specific complaints today except for arthralgia. She denied having any significant weight loss or night sweats. She has no chest pain or shortness of breath. No bleeding issues.  She has repeat CBC and comprehensive metabolic panel performed today and she is here for evaluation and discussion of lab results.  ALLERGIES:   has no known allergies.  MEDICATIONS:  Current Outpatient Prescriptions  Medication Sig Dispense Refill  . acetaminophen (TYLENOL) 325 MG tablet Take 650 mg by mouth every 6 (six) hours as needed.      . ALPRAZolam (XANAX) 0.5 MG tablet Take 0.5 mg by mouth 2 (two) times daily as needed.      Marland Kitchen aspirin 81 MG tablet Take 81 mg by mouth daily. 2-3 times weekly      . bifidobacterium infantis (ALIGN) capsule Take 1 capsule by mouth daily.      Marland Kitchen dextromethorphan (DELSYM) 30 MG/5ML liquid Take 60 mg by mouth as needed.      . diltiazem (CARDIZEM CD) 240 MG 24 hr capsule Take 240 mg by mouth daily.      Marland Kitchen donepezil (ARICEPT) 5 MG tablet Take 5 mg by mouth at bedtime.      . ergocalciferol (VITAMIN D2) 50000 UNITS capsule Take 50,000 Units by mouth every 30 (thirty) days.      . furosemide (LASIX) 20 MG tablet Take 20 mg by mouth daily.      Marland Kitchen HYDROcodone-homatropine (HYCODAN) 5-1.5 MG/5ML syrup Take by mouth at bedtime as needed.      . imatinib (GLEEVEC) 400 MG tablet Take 1 tablet (400 mg total) by mouth daily. Take with meals and large glass of water.Caution:Chemotherapy.  30  tablet  1  . omeprazole (PRILOSEC) 20 MG capsule Take 20 mg by mouth 2 (two) times daily.      . potassium chloride (K-DUR) 10 MEQ tablet Take 10 mEq by mouth daily.      . promethazine (PHENERGAN) 25 MG tablet Take 25 mg by mouth every 6 (six) hours as needed. q 4-6h prn      . psyllium (METAMUCIL) 58.6 % powder Take 1 packet by mouth 3 (three) times daily.      . ramipril (ALTACE) 5 MG capsule Take 10 mg by mouth daily.         REVIEW OF SYSTEMS:  A comprehensive review of systems was negative except for: Constitutional: positive for fatigue Musculoskeletal: positive for arthralgias   PHYSICAL EXAMINATION: General appearance: alert, cooperative, fatigued and no distress Neck: no adenopathy Lymph nodes: Cervical, supraclavicular, and axillary nodes normal. Resp: clear to auscultation bilaterally Cardio: regular rate and rhythm, S1, S2 normal, no murmur, click, rub or gallop GI: soft, non-tender; bowel sounds normal; no masses,  no organomegaly Extremities: extremities normal, atraumatic, no cyanosis or edema  ECOG PERFORMANCE STATUS: 1 - Symptomatic but completely ambulatory  Blood pressure 172/79, pulse 67, temperature 97.4 F (36.3 C), temperature source Oral, height 5' 3" (1.6 m), weight 153 lb 4.8 oz (69.536 kg).  LABORATORY DATA: Lab  Results  Component Value Date   WBC 3.3* 02/10/2012   HGB 11.1* 02/10/2012   HCT 33.9* 02/10/2012   MCV 93.0 02/10/2012   PLT 242 02/10/2012      Chemistry      Component Value Date/Time   NA 138 11/01/2011 1038   K 3.9 11/01/2011 1038   CL 103 11/01/2011 1038   CO2 25 11/01/2011 1038   BUN 17 11/01/2011 1038   CREATININE 0.97 11/01/2011 1038      Component Value Date/Time   CALCIUM 9.4 11/01/2011 1038   ALKPHOS 88 11/01/2011 1038   AST 18 11/01/2011 1038   ALT 10 11/01/2011 1038   BILITOT 0.3 11/01/2011 1038       RADIOGRAPHIC STUDIES: No results found.  ASSESSMENT: This is a very pleasant 78 years old [place] American female with chronic  myeloid leukemia currently on treatment with Gleevec and tolerating it fairly well.   PLAN: I discussed the lab result with the patient today. I recommended for her to continue on the same dose of Gleevec. I would see her back for followup visit in 3 months with repeat CBC, comprehensive metabolic panel, LDH and molecular studies for BCR/ABL. She was advised to call me immediately if she has any concerning symptoms in the interval.  All questions were answered. The patient knows to call the clinic with any problems, questions or concerns. We can certainly see the patient much sooner if necessary.

## 2012-02-21 ENCOUNTER — Other Ambulatory Visit: Payer: Self-pay | Admitting: *Deleted

## 2012-02-21 DIAGNOSIS — C9210 Chronic myeloid leukemia, BCR/ABL-positive, not having achieved remission: Secondary | ICD-10-CM

## 2012-02-21 MED ORDER — IMATINIB MESYLATE 400 MG PO TABS: 400.0000 mg | ORAL_TABLET | Freq: Every day | ORAL | Status: DC

## 2012-05-12 ENCOUNTER — Encounter: Payer: Self-pay | Admitting: Internal Medicine

## 2012-05-12 ENCOUNTER — Encounter: Payer: Self-pay | Admitting: *Deleted

## 2012-05-12 NOTE — Progress Notes (Signed)
Called [employer] @ [phone] for gleevec 450m prior auth; per rep I need to call [phone] the auth.  Per rep @ [phone] do not do pa's and it is [employer]'s job.  Called both companies four times and neither will do a prior authorization.

## 2012-05-12 NOTE — Progress Notes (Signed)
RECEIVED A FAX FROM [facility] CONCERNING A PRIOR AUTHORIZATION FOR GLEEVEC. THIS REQUEST WAS PLACED IN THE MANAGED CARE BIN.

## 2012-05-16 ENCOUNTER — Other Ambulatory Visit (HOSPITAL_BASED_OUTPATIENT_CLINIC_OR_DEPARTMENT_OTHER): Payer: Medicare Other

## 2012-05-16 DIAGNOSIS — C9210 Chronic myeloid leukemia, BCR/ABL-positive, not having achieved remission: Secondary | ICD-10-CM

## 2012-05-16 LAB — COMPREHENSIVE METABOLIC PANEL (CC13)
ALT: 14 U/L (ref 0–55)
AST: 17 U/L (ref 5–34)
BUN: 11 mg/dL (ref 7.0–26.0)
Calcium: 10 mg/dL (ref 8.4–10.4)
Chloride: 107 mEq/L (ref 98–107)
Creatinine: 0.9 mg/dL (ref 0.6–1.1)
Total Bilirubin: 0.3 mg/dL (ref 0.20–1.20)

## 2012-05-16 LAB — CBC WITH DIFFERENTIAL/PLATELET
BASO%: 0.8 % (ref 0.0–2.0)
EOS%: 1.7 % (ref 0.0–7.0)
HCT: 32.8 % — ABNORMAL LOW (ref 34.8–46.6)
LYMPH%: 43.3 % (ref 14.0–49.7)
MCH: 31.6 pg (ref 25.1–34.0)
MCHC: 33.7 g/dL (ref 31.5–36.0)
MCV: 93.7 fL (ref 79.5–101.0)
MONO#: 0.3 10*3/uL (ref 0.1–0.9)
MONO%: 7.4 % (ref 0.0–14.0)
NEUT%: 46.8 % (ref 38.4–76.8)
Platelets: 262 10*3/uL (ref 145–400)
RBC: 3.5 10*6/uL — ABNORMAL LOW (ref 3.70–5.45)
WBC: 3.6 10*3/uL — ABNORMAL LOW (ref 3.9–10.3)

## 2012-05-23 ENCOUNTER — Other Ambulatory Visit: Payer: Self-pay | Admitting: Medical Oncology

## 2012-05-23 ENCOUNTER — Ambulatory Visit (HOSPITAL_BASED_OUTPATIENT_CLINIC_OR_DEPARTMENT_OTHER): Payer: Medicare Other | Admitting: Internal Medicine

## 2012-05-23 VITALS — BP 145/69 | HR 61 | Temp 97.9°F | Resp 18 | Ht 63.0 in | Wt 148.5 lb

## 2012-05-23 DIAGNOSIS — C9210 Chronic myeloid leukemia, BCR/ABL-positive, not having achieved remission: Secondary | ICD-10-CM | POA: Insufficient documentation

## 2012-05-23 NOTE — Telephone Encounter (Signed)
Pts daughter said she can get her gleevec today at [facility]. I left message for [facility] to call me  Back to confirm that she can get Gleevc and not have a lapse in therapy

## 2012-05-23 NOTE — Patient Instructions (Signed)
Your blood count is acceptable today. The molecular studies are pending. Continue on Gleevec with the current dose. Followup in 3 month.

## 2012-05-23 NOTE — Telephone Encounter (Signed)
gleevec needs prior auth - pharmacist will check periodically

## 2012-05-23 NOTE — Progress Notes (Signed)
[place] Telephone:([phone]   Fax:(336) [phone]  OFFICE PROGRESS NOTE  Pamela House, [place], [place], [address] [place] [address]  PRINCIPAL DIAGNOSIS: Chronic myeloid leukemia diagnosed in June 2010.   CURRENT THERAPY: Gleevec 400 mg p.o. daily, except Monday and Thursday secondary to significant neutropenia with the daily dose.   INTERVAL HISTORY: Pamela House 78 y.o. female returns to the clinic today for followup visit accompanied by her daughter. The patient is feeling fine today with no specific complaints except for occasional night sweats. She denied having any significant weight loss. She has no chest pain, shortness breath, cough or hemoptysis. She has been off her treatment with Gleevec in the last week off August of 2013 because of copayment issues. She is currently doing recertification with her insurance and expected to receive the medication today. The patient has repeat CBC, comprehensive metabolic panel, LDH and a molecular studies for BCR/ABL performed recently and she is here for evaluation and discussion of her lab results  MEDICAL HISTORY:No past medical history on file.  ALLERGIES:   has no known allergies.  MEDICATIONS:  Current Outpatient Prescriptions  Medication Sig Dispense Refill  . acetaminophen (TYLENOL) 325 MG tablet Take 650 mg by mouth every 6 (six) hours as needed.      . ALPRAZolam (XANAX) 0.5 MG tablet Take 0.5 mg by mouth 2 (two) times daily as needed.      Marland Kitchen aspirin 81 MG tablet Take 81 mg by mouth daily. 2-3 times weekly      . bifidobacterium infantis (ALIGN) capsule Take 1 capsule by mouth daily.      Marland Kitchen dextromethorphan (DELSYM) 30 MG/5ML liquid Take 60 mg by mouth as needed.      . diltiazem (CARDIZEM CD) 240 MG 24 hr capsule Take 240 mg by mouth daily.      Marland Kitchen donepezil (ARICEPT) 5 MG tablet Take 5 mg by mouth at bedtime.      . ergocalciferol (VITAMIN D2) 50000 UNITS capsule Take 50,000 Units by mouth  every 30 (thirty) days.      . furosemide (LASIX) 20 MG tablet Take 20 mg by mouth daily.      Marland Kitchen HYDROcodone-homatropine (HYCODAN) 5-1.5 MG/5ML syrup Take by mouth at bedtime as needed.      . imatinib (GLEEVEC) 400 MG tablet Take 1 tablet (400 mg total) by mouth daily. Take with meals and large glass of water.Caution:Chemotherapy.  30 tablet  2  . omeprazole (PRILOSEC) 20 MG capsule Take 20 mg by mouth 2 (two) times daily.      . potassium chloride (K-DUR) 10 MEQ tablet Take 10 mEq by mouth daily.      . promethazine (PHENERGAN) 25 MG tablet Take 25 mg by mouth every 6 (six) hours as needed. q 4-6h prn      . psyllium (METAMUCIL) 58.6 % powder Take 1 packet by mouth 3 (three) times daily.      . ramipril (ALTACE) 5 MG capsule Take 10 mg by mouth daily.         REVIEW OF SYSTEMS:  A comprehensive review of systems was negative except for: Constitutional: positive for night sweats   PHYSICAL EXAMINATION: General appearance: alert, cooperative and no distress Neck: no adenopathy Lymph nodes: Cervical, supraclavicular, and axillary nodes normal. Resp: clear to auscultation bilaterally Cardio: regular rate and rhythm, S1, S2 normal, no murmur, click, rub or gallop GI: soft, non-tender; bowel sounds normal; no masses,  no  organomegaly Extremities: extremities normal, atraumatic, no cyanosis or edema  ECOG PERFORMANCE STATUS: 1 - Symptomatic but completely ambulatory  Blood pressure 145/69, pulse 61, temperature 97.9 F (36.6 C), temperature source Oral, resp. rate 18, height _0  (1.6 m), weight 148 lb 8 oz (67.359 kg).  LABORATORY DATA: Lab Results  Component Value Date   WBC 3.6* 05/16/2012   HGB 11.1* 05/16/2012   HCT 32.8* 05/16/2012   MCV 93.7 05/16/2012   PLT 262 05/16/2012      Chemistry      Component Value Date/Time   NA 145 05/16/2012 1318   NA 139 02/10/2012 1208   K 3.5 05/16/2012 1318   K 3.5 02/10/2012 1208   CL 107 05/16/2012 1318   CL 103 02/10/2012 1208   CO2 26 05/16/2012 1318     CO2 29 02/10/2012 1208   BUN 11.0 05/16/2012 1318   BUN 10 02/10/2012 1208   CREATININE 0.9 05/16/2012 1318   CREATININE 0.95 02/10/2012 1208      Component Value Date/Time   CALCIUM 10.0 05/16/2012 1318   CALCIUM 9.3 02/10/2012 1208   ALKPHOS 93 05/16/2012 1318   ALKPHOS 90 02/10/2012 1208   AST 17 05/16/2012 1318   AST 17 02/10/2012 1208   ALT 14 05/16/2012 1318   ALT 12 02/10/2012 1208   BILITOT 0.30 05/16/2012 1318   BILITOT 0.4 02/10/2012 1208       RADIOGRAPHIC STUDIES: No results found.  ASSESSMENT: This is a very pleasant 78 years old African American female with history of chronic myeloid leukemia currently on treatment with Gleevec 400 mg by mouth daily except Monday and Thursday and the patient has been doing well with this current dose. She has no evidence for morphological progression of her disease but the molecular studies still pending.  PLAN: I recommended for the patient to continue her treatment with the current dose. She would come back for followup visit in 3 months with repeat CBC, comprehensive metabolic panel and LDH. She was advised to call me immediately if she has any concerning symptoms in the interval.  All questions were answered. The patient knows to call the clinic with any problems, questions or concerns. We can certainly see the patient much sooner if necessary.

## 2012-05-23 NOTE — Telephone Encounter (Signed)
appts made and printed for  Pt Pamela House

## 2012-05-25 LAB — BCR/ABL

## 2012-05-29 ENCOUNTER — Ambulatory Visit: Payer: Medicare Other

## 2012-05-31 ENCOUNTER — Ambulatory Visit
Admission: RE | Admit: 2012-05-31 | Discharge: 2012-05-31 | Disposition: A | Discharge status: Home / Self Care | Payer: Medicare Other | Source: Ambulatory Visit | Attending: Internal Medicine | Admitting: Internal Medicine

## 2012-05-31 ENCOUNTER — Ambulatory Visit: Payer: Medicare Other

## 2012-05-31 DIAGNOSIS — Z1231 Encounter for screening mammogram for malignant neoplasm of breast: Secondary | ICD-10-CM

## 2012-06-26 ENCOUNTER — Other Ambulatory Visit: Payer: Self-pay | Admitting: *Deleted

## 2012-06-26 DIAGNOSIS — C9210 Chronic myeloid leukemia, BCR/ABL-positive, not having achieved remission: Secondary | ICD-10-CM

## 2012-06-26 NOTE — Telephone Encounter (Signed)
THIS REFILL REQUEST FOR GLEEVEC WAS GIVEN TO DR.MOHAMED'S NURSE, STEPHANIE JOHNSON,RN.

## 2012-06-27 MED ORDER — IMATINIB MESYLATE 400 MG PO TABS: 400.0000 mg | ORAL_TABLET | Freq: Every day | ORAL | Status: DC

## 2012-06-27 NOTE — Addendum Note (Signed)
Addended by: Wyonia Hough on: 06/27/2012 12:32 PM   Modules accepted: Orders

## 2012-08-22 ENCOUNTER — Other Ambulatory Visit (HOSPITAL_BASED_OUTPATIENT_CLINIC_OR_DEPARTMENT_OTHER): Payer: Medicare Other | Admitting: Lab

## 2012-08-22 ENCOUNTER — Ambulatory Visit (HOSPITAL_BASED_OUTPATIENT_CLINIC_OR_DEPARTMENT_OTHER): Payer: Medicare Other | Admitting: Internal Medicine

## 2012-08-22 ENCOUNTER — Telehealth: Payer: Self-pay | Admitting: Internal Medicine

## 2012-08-22 VITALS — BP 151/72 | HR 63 | Temp 98.6°F | Resp 18 | Ht 63.0 in | Wt 147.6 lb

## 2012-08-22 DIAGNOSIS — C9210 Chronic myeloid leukemia, BCR/ABL-positive, not having achieved remission: Secondary | ICD-10-CM

## 2012-08-22 DIAGNOSIS — J42 Unspecified chronic bronchitis: Secondary | ICD-10-CM

## 2012-08-22 LAB — COMPREHENSIVE METABOLIC PANEL (CC13)
ALT: 16 U/L (ref 0–55)
Albumin: 4 g/dL (ref 3.5–5.0)
CO2: 29 mEq/L (ref 22–29)
Calcium: 9.6 mg/dL (ref 8.4–10.4)
Chloride: 105 mEq/L (ref 98–107)
Glucose: 99 mg/dl (ref 70–99)
Sodium: 142 mEq/L (ref 136–145)
Total Protein: 6.8 g/dL (ref 6.4–8.3)

## 2012-08-22 LAB — CBC WITH DIFFERENTIAL/PLATELET
BASO%: 0.9 % (ref 0.0–2.0)
Eosinophils Absolute: 0 10*3/uL (ref 0.0–0.5)
HCT: 34.1 % — ABNORMAL LOW (ref 34.8–46.6)
LYMPH%: 41.9 % (ref 14.0–49.7)
MCHC: 33.6 g/dL (ref 31.5–36.0)
MONO#: 0.1 10*3/uL (ref 0.1–0.9)
NEUT#: 1.9 10*3/uL (ref 1.5–6.5)
Platelets: 235 10*3/uL (ref 145–400)
RBC: 3.67 10*6/uL — ABNORMAL LOW (ref 3.70–5.45)
WBC: 3.6 10*3/uL — ABNORMAL LOW (ref 3.9–10.3)
lymph#: 1.5 10*3/uL (ref 0.9–3.3)

## 2012-08-22 MED ORDER — AZITHROMYCIN 250 MG PO TABS: ORAL_TABLET | ORAL | Status: DC

## 2012-08-22 NOTE — Telephone Encounter (Signed)
appts made and printed for pt Pamela House

## 2012-08-22 NOTE — Patient Instructions (Addendum)
Your lab work is stable today. Continue treatment with Gleevec.

## 2012-08-22 NOTE — Progress Notes (Signed)
[place] [phone]   [phone]  OFFICE PROGRESS NOTE  Pamela House, Pamela House, Pamela House, [address] [place] [address]  PRINCIPAL DIAGNOSIS: Chronic myeloid leukemia diagnosed in June 2010.   CURRENT THERAPY: Gleevec 400 mg p.o. daily, except Monday and Thursday secondary to significant neutropenia with the daily dose.  INTERVAL HISTORY: Pamela House 78 y.o. female returns to the clinic today for routine three-month followup visit. The patient is feeling fine today with no specific complaints except for recent bronchitis with cough productive of yellowish sputum. She she had low-grade fever recently. The patient denied having any significant weight loss or night sweats. She denied having any chest pain, shortness of breath or hemoptysis. She had repeat CBC earlier today and she is here for evaluation and discussion of her lab results.  ALLERGIES:   has no known allergies.  MEDICATIONS:  Current Outpatient Prescriptions  Medication Sig Dispense Refill  . acetaminophen (TYLENOL) 325 MG tablet Take 650 mg by mouth every 6 (six) hours as needed.      . ALPRAZolam (XANAX) 0.5 MG tablet Take 0.5 mg by mouth 2 (two) times daily as needed.      Marland Kitchen aspirin 81 MG tablet Take 81 mg by mouth daily. 2-3 times weekly      . bifidobacterium infantis (ALIGN) capsule Take 1 capsule by mouth daily.      Marland Kitchen dextromethorphan (DELSYM) 30 MG/5ML liquid Take 60 mg by mouth as needed.      . diltiazem (CARDIZEM CD) 240 MG 24 hr capsule Take 240 mg by mouth daily.      Marland Kitchen donepezil (ARICEPT) 5 MG tablet Take 5 mg by mouth at bedtime.      . ergocalciferol (VITAMIN D2) 50000 UNITS capsule Take 50,000 Units by mouth every 30 (thirty) days.      . furosemide (LASIX) 20 MG tablet Take 20 mg by mouth daily.      Marland Kitchen HYDROcodone-homatropine (HYCODAN) 5-1.5 MG/5ML syrup Take by mouth at bedtime as needed.      . imatinib (GLEEVEC) 400 MG tablet Take 1 tablet (400 mg  total) by mouth daily. Take with meals and large glass of water.Caution:Chemotherapy.  30 tablet  1  . omeprazole (PRILOSEC) 20 MG capsule Take 20 mg by mouth 2 (two) times daily.      . potassium chloride (K-DUR) 10 MEQ tablet Take 10 mEq by mouth daily.      . promethazine (PHENERGAN) 25 MG tablet Take 25 mg by mouth every 6 (six) hours as needed. q 4-6h prn      . psyllium (METAMUCIL) 58.6 % powder Take 1 packet by mouth 3 (three) times daily.      . ramipril (ALTACE) 5 MG capsule Take 10 mg by mouth daily.         REVIEW OF SYSTEMS:  A comprehensive review of systems was negative except for: Constitutional: positive for fatigue Respiratory: positive for cough and sputum   PHYSICAL EXAMINATION: General appearance: alert, cooperative and no distress Head: Normocephalic, without obvious abnormality, atraumatic Neck: no adenopathy Lymph nodes: Cervical, supraclavicular, and axillary nodes normal. Resp: wheezes bilaterally Cardio: regular rate and rhythm, S1, S2 normal, no murmur, click, rub or gallop GI: soft, non-tender; bowel sounds normal; no masses,  no organomegaly Extremities: extremities normal, atraumatic, no cyanosis or edema  ECOG PERFORMANCE STATUS: 1 - Symptomatic but completely ambulatory  Blood pressure 151/72, pulse 63, temperature 98.6 F (37 C), temperature source  Oral, resp. rate 18, height _0  (1.6 m), weight 147 lb 9.6 oz (66.951 kg).  LABORATORY DATA: Lab Results  Component Value Date   WBC 3.6* 08/22/2012   HGB 11.5* 08/22/2012   HCT 34.1* 08/22/2012   MCV 92.9 08/22/2012   PLT 235 08/22/2012      Chemistry      Component Value Date/Time   NA 142 08/22/2012 1427   NA 139 02/10/2012 1208   K 3.3* 08/22/2012 1427   K 3.5 02/10/2012 1208   CL 105 08/22/2012 1427   CL 103 02/10/2012 1208   CO2 29 08/22/2012 1427   CO2 29 02/10/2012 1208   BUN 9.0 08/22/2012 1427   BUN 10 02/10/2012 1208   CREATININE 0.9 08/22/2012 1427   CREATININE 0.95 02/10/2012 1208        Component Value Date/Time   CALCIUM 9.6 08/22/2012 1427   CALCIUM 9.3 02/10/2012 1208   ALKPHOS 94 08/22/2012 1427   ALKPHOS 90 02/10/2012 1208   AST 21 08/22/2012 1427   AST 17 02/10/2012 1208   ALT 16 08/22/2012 1427   ALT 12 02/10/2012 1208   BILITOT 0.31 08/22/2012 1427   BILITOT 0.4 02/10/2012 1208       RADIOGRAPHIC STUDIES: No results found.  ASSESSMENT: This is a very pleasant 78 years old Pamela House American female with chronic myelogenous leukemia currently on treatment with Gleevec. The patient is tolerating her treatment fairly well with no significant adverse effects. Her CBC today showed no significant change to indicate morphologic disease progression. The patient is currently suffering from acute bronchitis.  PLAN: I discussed the lab result with the patient today. I recommended for her to continue on Pamela House with the same dose and schedule. She would come back for followup visit in 3 months with repeat CBC, comprehensive metabolic panel and LDH. For the acute bronchitis I will start her on Z-Pak. If she has no significant improvement she would need to contact her primary care physician Dr. Milinda House.  All questions were answered. The patient knows to call the clinic with any problems, questions or concerns. We can certainly see the patient much sooner if necessary.  I spent 15 minutes counseling the patient face to face. The total time spent in the appointment was 25 minutes.

## 2012-09-11 ENCOUNTER — Other Ambulatory Visit: Payer: Self-pay | Admitting: *Deleted

## 2012-09-11 DIAGNOSIS — C9210 Chronic myeloid leukemia, BCR/ABL-positive, not having achieved remission: Secondary | ICD-10-CM

## 2012-09-11 MED ORDER — IMATINIB MESYLATE 400 MG PO TABS: 400.0000 mg | ORAL_TABLET | Freq: Every day | ORAL | Status: DC

## 2012-11-21 ENCOUNTER — Encounter: Payer: Self-pay | Admitting: Physician Assistant

## 2012-11-21 ENCOUNTER — Ambulatory Visit: Payer: Medicare Other | Admitting: Internal Medicine

## 2012-11-21 ENCOUNTER — Ambulatory Visit (HOSPITAL_BASED_OUTPATIENT_CLINIC_OR_DEPARTMENT_OTHER): Payer: Medicare Other | Admitting: Physician Assistant

## 2012-11-21 ENCOUNTER — Telehealth: Payer: Self-pay | Admitting: Physician Assistant

## 2012-11-21 ENCOUNTER — Other Ambulatory Visit: Payer: Medicare Other | Admitting: Lab

## 2012-11-21 ENCOUNTER — Other Ambulatory Visit (HOSPITAL_BASED_OUTPATIENT_CLINIC_OR_DEPARTMENT_OTHER): Payer: Medicare Other

## 2012-11-21 VITALS — BP 145/69 | HR 67 | Temp 98.0°F | Resp 18 | Ht 63.0 in | Wt 145.3 lb

## 2012-11-21 DIAGNOSIS — C9210 Chronic myeloid leukemia, BCR/ABL-positive, not having achieved remission: Secondary | ICD-10-CM

## 2012-11-21 DIAGNOSIS — R197 Diarrhea, unspecified: Secondary | ICD-10-CM

## 2012-11-21 LAB — CBC WITH DIFFERENTIAL/PLATELET
Basophils Absolute: 0 10*3/uL (ref 0.0–0.1)
Eosinophils Absolute: 0 10*3/uL (ref 0.0–0.5)
HCT: 33 % — ABNORMAL LOW (ref 34.8–46.6)
LYMPH%: 42.2 % (ref 14.0–49.7)
MCV: 91.9 fL (ref 79.5–101.0)
MONO#: 0.2 10*3/uL (ref 0.1–0.9)
MONO%: 5.1 % (ref 0.0–14.0)
NEUT#: 1.6 10*3/uL (ref 1.5–6.5)
NEUT%: 50.6 % (ref 38.4–76.8)
Platelets: 248 10*3/uL (ref 145–400)
WBC: 3.1 10*3/uL — ABNORMAL LOW (ref 3.9–10.3)

## 2012-11-21 LAB — COMPREHENSIVE METABOLIC PANEL (CC13)
Alkaline Phosphatase: 90 U/L (ref 40–150)
BUN: 10.3 mg/dL (ref 7.0–26.0)
CO2: 25 mEq/L (ref 22–29)
Creatinine: 0.9 mg/dL (ref 0.6–1.1)
Glucose: 90 mg/dl (ref 70–99)
Total Bilirubin: 0.3 mg/dL (ref 0.20–1.20)
Total Protein: 7.1 g/dL (ref 6.4–8.3)

## 2012-11-21 LAB — LACTATE DEHYDROGENASE (CC13): LDH: 235 U/L (ref 125–245)

## 2012-11-21 NOTE — Telephone Encounter (Signed)
Marland Kitchen

## 2012-11-21 NOTE — Patient Instructions (Addendum)
Continue taking her Gleevec 400 mg by mouth except none on Mondays and Thursdays Discontinue the Metamucil as this may be aggravating your diarrhea Followup with Dr. Deforest Hoyles as scheduled regarding her blood pressure or sooner as needed Followup in 3 months

## 2012-11-26 NOTE — Progress Notes (Signed)
Pamela [phone]   [phone]  OFFICE PROGRESS NOTE  Pamela House, [place], [place], [address] [place] [address]  PRINCIPAL DIAGNOSIS: Chronic myeloid leukemia diagnosed in June 2010.   CURRENT THERAPY: Gleevec 400 mg p.o. daily, except Monday and Thursday secondary to significant neutropenia with the daily dose.  INTERVAL HISTORY: Pamela House 79 y.o. female returns to the clinic today for routine three-month followup visit. The patient is feeling fine today with no specific complaints except for frequent episodes of diarrhea that have been going on for a while. She's had some allergy symptoms with associated cough and sneezing. She reports 1 episode of a MAXIMUM TEMPERATURE of 100 but no further fever. She's been using an over-the-counter diphenhydramine tablet with her relief of her symptoms. She still has some nausea but this is well-controlled with her promethazine. She remains fairly active and usually walks for 2-3 hours 2 or 3 times a week however the cold weather has impeded this recently. She's been taking Metamucil once or twice daily. Overall she's tolerating her Gleevec therapy relatively well.The patient denied having any significant weight loss or night sweats. She denied having any chest pain, shortness of breath or hemoptysis. She had repeat CBC earlier today and she is here for evaluation and discussion of her lab results.  ALLERGIES:  has No Known Allergies.  MEDICATIONS:  Current Outpatient Prescriptions  Medication Sig Dispense Refill  . ramipril (ALTACE) 10 MG capsule Take 10 mg by mouth daily.      Marland Kitchen acetaminophen (TYLENOL) 325 MG tablet Take 650 mg by mouth every 6 (six) hours as needed.      . ALPRAZolam (XANAX) 0.5 MG tablet Take 0.5 mg by mouth 2 (two) times daily as needed.      Marland Kitchen aspirin 81 MG tablet Take 81 mg by mouth daily. 2-3 times weekly      . azithromycin (ZITHROMAX) 250 MG tablet Use as  instructed  6 each  0  . bifidobacterium infantis (ALIGN) capsule Take 1 capsule by mouth daily.      Marland Kitchen dextromethorphan (DELSYM) 30 MG/5ML liquid Take 60 mg by mouth as needed.      . diltiazem (CARDIZEM CD) 240 MG 24 hr capsule Take 240 mg by mouth daily.      Marland Kitchen donepezil (ARICEPT) 5 MG tablet Take 5 mg by mouth at bedtime.      . ergocalciferol (VITAMIN D2) 50000 UNITS capsule Take 50,000 Units by mouth every 30 (thirty) days.      . furosemide (LASIX) 20 MG tablet Take 20 mg by mouth daily.      Marland Kitchen HYDROcodone-homatropine (HYCODAN) 5-1.5 MG/5ML syrup Take by mouth at bedtime as needed.      . imatinib (GLEEVEC) 400 MG tablet Take 1 tablet (400 mg total) by mouth daily. Take with meals and large glass of water.Caution:Chemotherapy.  30 tablet  3  . omeprazole (PRILOSEC) 20 MG capsule Take 20 mg by mouth 2 (two) times daily.      . potassium chloride (K-DUR) 10 MEQ tablet Take 10 mEq by mouth daily.      . promethazine (PHENERGAN) 25 MG tablet Take 25 mg by mouth every 6 (six) hours as needed. q 4-6h prn      . psyllium (METAMUCIL) 58.6 % powder Take 1 packet by mouth 3 (three) times daily.       No current facility-administered medications for this visit.    REVIEW OF  SYSTEMS:  A comprehensive review of systems was negative except for: Constitutional: positive for fatigue Gastrointestinal: positive for diarrhea and nausea   PHYSICAL EXAMINATION: General appearance: alert, cooperative and no distress Head: Normocephalic, without obvious abnormality, atraumatic Neck: no adenopathy Lymph nodes: Cervical, supraclavicular, and axillary nodes normal. Resp: wheezes bilaterally Cardio: regular rate and rhythm, S1, S2 normal, no murmur, click, rub or gallop GI: soft, non-tender; bowel sounds normal; no masses,  no organomegaly Extremities: extremities normal, atraumatic, no cyanosis or edema  ECOG PERFORMANCE STATUS: 1 - Symptomatic but completely ambulatory  Blood pressure 145/69, pulse 67,  temperature 98 F (36.7 C), temperature source Oral, resp. rate 18, height 5' 3" (1.6 m), weight 145 lb 4.8 oz (65.908 kg). Weight is down 3 pounds  LABORATORY DATA: Lab Results  Component Value Date   WBC 3.1* 11/21/2012   HGB 11.1* 11/21/2012   HCT 33.0* 11/21/2012   MCV 91.9 11/21/2012   PLT 248 11/21/2012      Chemistry      Component Value Date/Time   NA 140 11/21/2012 1427   NA 139 02/10/2012 1208   K 3.4* 11/21/2012 1427   K 3.5 02/10/2012 1208   CL 107 11/21/2012 1427   CL 103 02/10/2012 1208   CO2 25 11/21/2012 1427   CO2 29 02/10/2012 1208   BUN 10.3 11/21/2012 1427   BUN 10 02/10/2012 1208   CREATININE 0.9 11/21/2012 1427   CREATININE 0.95 02/10/2012 1208      Component Value Date/Time   CALCIUM 9.5 11/21/2012 1427   CALCIUM 9.3 02/10/2012 1208   ALKPHOS 90 11/21/2012 1427   ALKPHOS 90 02/10/2012 1208   AST 22 11/21/2012 1427   AST 17 02/10/2012 1208   ALT 17 11/21/2012 1427   ALT 12 02/10/2012 1208   BILITOT 0.30 11/21/2012 1427   BILITOT 0.4 02/10/2012 1208       RADIOGRAPHIC STUDIES: No results found.  ASSESSMENT/PLAN: This is a very pleasant 79 years old [place] American female with chronic myelogenous leukemia currently on treatment with Gleevec. The patient is tolerating her treatment fairly well with no significant adverse effects. Patient was discussed with Dr. Julien Nordmann Her CBC today showed no significant change to indicate morphologic disease progression.  Patient is advised to discontinue the Metamucil as this may be after graduating her diarrhea. She will continue taking her Gleevec at the current dose of 400 mg daily except none on Mondays and Thursdays. She will followup for another symptom management visit in 3 months. She will followup with her primary care physician, Dr. Deforest Hoyles, as scheduled regarding her blood pressure or sooner as needed.   Pamela House, Pamela Meir E, PA-C   All questions were answered. The patient knows to call the clinic with any problems, questions or  concerns. We can certainly see the patient much sooner if necessary.  I spent 20 minutes counseling the patient face to face. The total time spent in the appointment was 30 minutes.

## 2013-01-30 ENCOUNTER — Other Ambulatory Visit: Payer: Self-pay

## 2013-01-30 DIAGNOSIS — Z1231 Encounter for screening mammogram for malignant neoplasm of breast: Secondary | ICD-10-CM

## 2013-02-21 ENCOUNTER — Encounter: Payer: Self-pay | Admitting: Physician Assistant

## 2013-02-21 ENCOUNTER — Encounter: Payer: Self-pay | Admitting: Internal Medicine

## 2013-02-21 ENCOUNTER — Ambulatory Visit (HOSPITAL_BASED_OUTPATIENT_CLINIC_OR_DEPARTMENT_OTHER): Payer: Medicare Other | Admitting: Physician Assistant

## 2013-02-21 ENCOUNTER — Other Ambulatory Visit (HOSPITAL_BASED_OUTPATIENT_CLINIC_OR_DEPARTMENT_OTHER): Payer: Medicare Other | Admitting: Lab

## 2013-02-21 ENCOUNTER — Telehealth: Payer: Self-pay | Admitting: Internal Medicine

## 2013-02-21 DIAGNOSIS — C9210 Chronic myeloid leukemia, BCR/ABL-positive, not having achieved remission: Secondary | ICD-10-CM

## 2013-02-21 DIAGNOSIS — D709 Neutropenia, unspecified: Secondary | ICD-10-CM

## 2013-02-21 DIAGNOSIS — R5381 Other malaise: Secondary | ICD-10-CM

## 2013-02-21 DIAGNOSIS — R05 Cough: Secondary | ICD-10-CM

## 2013-02-21 DIAGNOSIS — E876 Hypokalemia: Secondary | ICD-10-CM

## 2013-02-21 DIAGNOSIS — I10 Essential (primary) hypertension: Secondary | ICD-10-CM

## 2013-02-21 DIAGNOSIS — R252 Cramp and spasm: Secondary | ICD-10-CM

## 2013-02-21 LAB — CBC WITH DIFFERENTIAL/PLATELET
Basophils Absolute: 0 10*3/uL (ref 0.0–0.1)
EOS%: 1.4 % (ref 0.0–7.0)
Eosinophils Absolute: 0 10*3/uL (ref 0.0–0.5)
LYMPH%: 47.7 % (ref 14.0–49.7)
MCH: 31.7 pg (ref 25.1–34.0)
MCV: 92.5 fL (ref 79.5–101.0)
MONO%: 9.3 % (ref 0.0–14.0)
NEUT#: 1 10*3/uL — ABNORMAL LOW (ref 1.5–6.5)
Platelets: 223 10*3/uL (ref 145–400)
RBC: 3.37 10*6/uL — ABNORMAL LOW (ref 3.70–5.45)
RDW: 14.2 % (ref 11.2–14.5)

## 2013-02-21 LAB — COMPREHENSIVE METABOLIC PANEL (CC13)
Alkaline Phosphatase: 89 U/L (ref 40–150)
CO2: 24 mEq/L (ref 22–29)
Creatinine: 1.2 mg/dL — ABNORMAL HIGH (ref 0.6–1.1)
Glucose: 92 mg/dl (ref 70–99)
Total Bilirubin: 0.33 mg/dL (ref 0.20–1.20)

## 2013-02-21 LAB — LACTATE DEHYDROGENASE (CC13): LDH: 244 U/L (ref 125–245)

## 2013-02-21 MED ORDER — ENSURE PO LIQD: 1.0000 | Freq: Two times a day (BID) | ORAL | Status: DC

## 2013-02-21 NOTE — Telephone Encounter (Signed)
gv and printed appt sched and avs for pt....sched pt to see Dr. Lysle Rubens on 6.16.14 _0 :00pm..Marland KitchenMarland Kitchen

## 2013-02-21 NOTE — Progress Notes (Signed)
Patient said all mail should be going to po box, but we had physical address. I changed and gave her billing phone to call and get new bill.

## 2013-02-21 NOTE — Progress Notes (Addendum)
[facility] [phone]   [phone]) [phone]  SHARED VISIT PROGRESS NOTE  Pamela Craver, MD [address], [place], [address] [place] [address]  PRINCIPAL DIAGNOSIS: Chronic myeloid leukemia diagnosed in June 2010.   CURRENT THERAPY: Gleevec 400 mg p.o. daily, except Monday and Thursday secondary to significant neutropenia with the daily dose.  INTERVAL HISTORY: Pamela House 79 y.o. female returns to the clinic today for routine three-month followup visit. She reports some continued episodes of diarrhea but states it is significantly less frequently than when she was here 3 months ago She continues to have significant head congestion and sinus problems by her account. She denies fever chills She's been taking some diphenhydramine for her allergy symptoms as well as some Claritin with some relief. She complains of some generalized pain she states "from my head all the way down to my legs including the shoulders and arms". She complains of her legs cramping and feeling knots in her legs. She also reports some balance issues, specifically feeling off balance but denies any falls. She's currently using a cane to help with her ambulation. She reports decreased appetite and has had some stomach discomfort. She's currently on omeprazole. She changed her diet and started eating some baby food for a few days and then progressed to solid foods with resolution of her stomach discomfort. She's also drinking boost or Ensure one or 2 cans a day and feels that this gives her strength. She reports an episode that occurred last week where her lips were "screwed to the side" and she had weakness of both upper extremities with some difficulty talking. Her symptoms subsided and she has not had recurrence since then. The patient states that at the time she was concerned that she was having a stroke. She still has some nausea but this was well-controlled with her promethazine. She is having  difficulty with the cost of this medication and states that her primary care physician is working to prescribe something less costly for her. She has been a bit less active over the past few weeks because she has not felt well with many of the symptoms described above. Overall she's tolerating her Gleevec therapy relatively well.The patient denied having any significant weight loss or night sweats. She denied having any chest pain, shortness of breath or hemoptysis. She had repeat CBC earlier today and she is here for evaluation and discussion of her lab results.  ALLERGIES:  has No Known Allergies.  MEDICATIONS:  Current Outpatient Prescriptions  Medication Sig Dispense Refill  . diphenhydrAMINE (SOMINEX) 25 MG tablet Take 25 mg by mouth 2 (two) times daily as needed for allergies.      Marland Kitchen acetaminophen (TYLENOL) 325 MG tablet Take 650 mg by mouth every 6 (six) hours as needed.      . ALPRAZolam (XANAX) 0.5 MG tablet Take 0.5 mg by mouth 2 (two) times daily as needed.      Marland Kitchen aspirin 81 MG tablet Take 81 mg by mouth daily. 2-3 times weekly      . diltiazem (CARDIZEM CD) 240 MG 24 hr capsule Take 240 mg by mouth daily.      Marland Kitchen donepezil (ARICEPT) 5 MG tablet Take 5 mg by mouth at bedtime.      . ENSURE (ENSURE) Take 1 Can by mouth 2 (two) times daily between meals.  5688 mL  3  . ergocalciferol (VITAMIN D2) 50000 UNITS capsule Take 50,000 Units by mouth every 30 (thirty)  days.      . furosemide (LASIX) 20 MG tablet Take 20 mg by mouth daily.      Marland Kitchen imatinib (GLEEVEC) 400 MG tablet Take 1 tablet (400 mg total) by mouth daily. Take with meals and large glass of water.Caution:Chemotherapy.  30 tablet  3  . omeprazole (PRILOSEC) 20 MG capsule Take 20 mg by mouth 2 (two) times daily.      . potassium chloride (K-DUR) 10 MEQ tablet Take 10 mEq by mouth daily.      . promethazine (PHENERGAN) 25 MG tablet Take 25 mg by mouth every 6 (six) hours as needed. q 4-6h prn      . psyllium (METAMUCIL) 58.6 % powder  Take 1 packet by mouth 3 (three) times daily.      . ramipril (ALTACE) 10 MG capsule Take 10 mg by mouth daily.       No current facility-administered medications for this visit.    REVIEW OF SYSTEMS:  A comprehensive review of systems was negative except for: Constitutional: positive for fatigue Ears, nose, mouth, throat, and face: positive for sinus congestion Gastrointestinal: positive for nausea and Occasional episodes of diarrhea Musculoskeletal: positive for arthralgias, muscle weakness and myalgias Neurological: positive for Recent problems with balance as well as an episode that is suspicious for possible TIA   PHYSICAL EXAMINATION: General appearance: alert, cooperative and no distress Head: Normocephalic, without obvious abnormality, atraumatic Neck: no adenopathy Lymph nodes: Cervical, supraclavicular, and axillary nodes normal. Resp: wheezes bilaterally Cardio: regular rate and rhythm, S1, S2 normal, no murmur, click, rub or gallop GI: soft, non-tender; bowel sounds normal; no masses,  no organomegaly Extremities: extremities normal, atraumatic, no cyanosis or edema, no palpable cords or areas of point tenderness or erythema  ECOG PERFORMANCE STATUS: 1 - Symptomatic but completely ambulatory  There were no vitals taken for this visit. Weight is down 2.3 pounds  LABORATORY DATA: Lab Results  Component Value Date   WBC 2.4* 02/21/2013   HGB 10.7* 02/21/2013   HCT 31.2* 02/21/2013   MCV 92.5 02/21/2013   PLT 223 02/21/2013      Chemistry      Component Value Date/Time   NA 136 02/21/2013 1355   NA 139 02/10/2012 1208   K 3.8 02/21/2013 1355   K 3.5 02/10/2012 1208   CL 106 02/21/2013 1355   CL 103 02/10/2012 1208   CO2 24 02/21/2013 1355   CO2 29 02/10/2012 1208   BUN 12.3 02/21/2013 1355   BUN 10 02/10/2012 1208   CREATININE 1.2* 02/21/2013 1355   CREATININE 0.95 02/10/2012 1208      Component Value Date/Time   CALCIUM 9.5 02/21/2013 1355   CALCIUM 9.3 02/10/2012 1208    ALKPHOS 89 02/21/2013 1355   ALKPHOS 90 02/10/2012 1208   AST 19 02/21/2013 1355   AST 17 02/10/2012 1208   ALT 14 02/21/2013 1355   ALT 12 02/10/2012 1208   BILITOT 0.33 02/21/2013 1355   BILITOT 0.4 02/10/2012 1208       RADIOGRAPHIC STUDIES: No results found.  ASSESSMENT/PLAN: This is a very pleasant 79 years old [place] American female with chronic myelogenous leukemia currently on treatment with Gleevec. The patient is tolerating her treatment fairly well with no significant adverse effects. Patient was discussed with and seen by Dr. Julien Nordmann. Her CBC today showed an ANC of 1.0 but no significant change to indicate morphologic disease progression. The patient was advised to decrease her current Gleevec dose to 400 mg by mouth  on Tuesdays, Thursdays, Saturdays and Sundays and none on Mondays Wednesdays and Fridays. She voiced understanding. She is given a prescription for Ensure, one can by mouth twice daily, a case with 3 refills to supplement her by mouth intake.  She will followup for another symptom management visit in 3 months. We will refer her back to her primary care physician, Dr. Deforest Hoyles, for reevaluation of her blood pressure medications as the Ramipril may be causing her chronic cough.In addition, this transient episode that she described last week with upper extremity weakness and torsion of the lips, with difficulty talking is concerning for possible TIA.     All questions were answered. The patient knows to call the clinic with any problems, questions or concerns. We can certainly see the patient much sooner if necessary.  I spent 25 minutes counseling the patient face to face. The total time spent in the appointment was 35 minutes.  Hematology/oncology attending: Ms. House is seen and examined in the clinic today and I finalized the care plan was Pamela House. The patient has a history of chronic myeloid leukemia and has been on treatment with Gleevec for the last few years and  tolerating it fairly well. She is currently on 400 mg daily except Monday and Thursday secondary to significant neutropenia. Her total red blood count as well as neutrophil count is lower today. I recommended for the patient to take her Gleevec 400 mg daily except Monday, Wednesday and Friday. Other option is to decrease the dose of Gleevec but the patient may have some financial issues with the copayment for the lower dose as her next dose available is only 100 mg and the patient may have to 3 tablets on daily basis for a total of 300 mg daily. She also complained of muscle cramps, likely secondary to fatigue and hypokalemia. Will recheck her potassium level today. The patient also has dry cough which could be secondary to her blood pressure medication with Ramipril. She was advised to consult with Dr. Deforest Hoyles her primary care physician regarding changing the medication. The patient would come back for followup visit in 3 months for reevaluation and repeat blood work.

## 2013-02-21 NOTE — Patient Instructions (Addendum)
Take Gleevec 431m by mouth on Tuesdays, Thursdays, Saturdays and Sundays only. DO NOT TAKE Gleevec on Mondays, Wednesdays or Fridays We are referring you back to Dr. HDeforest Hoylesto re-evaluate your complaints of chronic cough that may be related to one of your blood pressure medications. Also to evaluate you for possible TIA's Follow up with Dr. MJulien Nordmannin 3 months

## 2013-03-06 ENCOUNTER — Other Ambulatory Visit: Payer: Self-pay | Admitting: *Deleted

## 2013-03-06 DIAGNOSIS — C9210 Chronic myeloid leukemia, BCR/ABL-positive, not having achieved remission: Secondary | ICD-10-CM

## 2013-03-06 MED ORDER — IMATINIB MESYLATE 400 MG PO TABS: 400.0000 mg | ORAL_TABLET | Freq: Every day | ORAL | Status: DC

## 2013-05-21 ENCOUNTER — Ambulatory Visit
Admission: RE | Admit: 2013-05-21 | Discharge: 2013-05-21 | Disposition: A | Discharge status: Home / Self Care | Payer: Medicare Other | Source: Ambulatory Visit | Attending: Internal Medicine | Admitting: Internal Medicine

## 2013-05-21 ENCOUNTER — Other Ambulatory Visit: Payer: Self-pay | Admitting: Internal Medicine

## 2013-05-21 DIAGNOSIS — R9389 Abnormal findings on diagnostic imaging of other specified body structures: Secondary | ICD-10-CM

## 2013-05-22 ENCOUNTER — Telehealth: Payer: Self-pay | Admitting: Medical Oncology

## 2013-05-22 ENCOUNTER — Other Ambulatory Visit (HOSPITAL_BASED_OUTPATIENT_CLINIC_OR_DEPARTMENT_OTHER): Payer: Medicare Other | Admitting: Lab

## 2013-05-22 ENCOUNTER — Telehealth: Payer: Self-pay | Admitting: Internal Medicine

## 2013-05-22 ENCOUNTER — Encounter: Payer: Self-pay | Admitting: Internal Medicine

## 2013-05-22 ENCOUNTER — Ambulatory Visit (HOSPITAL_BASED_OUTPATIENT_CLINIC_OR_DEPARTMENT_OTHER): Payer: Medicare Other | Admitting: Internal Medicine

## 2013-05-22 VITALS — BP 190/78 | HR 58 | Temp 98.9°F | Resp 18 | Ht 63.0 in | Wt 141.5 lb

## 2013-05-22 DIAGNOSIS — C9210 Chronic myeloid leukemia, BCR/ABL-positive, not having achieved remission: Secondary | ICD-10-CM

## 2013-05-22 DIAGNOSIS — R05 Cough: Secondary | ICD-10-CM

## 2013-05-22 LAB — CBC WITH DIFFERENTIAL/PLATELET
Basophils Absolute: 0.1 10*3/uL (ref 0.0–0.1)
EOS%: 1.6 % (ref 0.0–7.0)
HCT: 33.6 % — ABNORMAL LOW (ref 34.8–46.6)
HGB: 11.3 g/dL — ABNORMAL LOW (ref 11.6–15.9)
MCH: 31.1 pg (ref 25.1–34.0)
MONO#: 0.2 10*3/uL (ref 0.1–0.9)
NEUT%: 47.9 % (ref 38.4–76.8)
lymph#: 1.6 10*3/uL (ref 0.9–3.3)

## 2013-05-22 LAB — COMPREHENSIVE METABOLIC PANEL (CC13)
BUN: 9.8 mg/dL (ref 7.0–26.0)
CO2: 26 mEq/L (ref 22–29)
Calcium: 9.4 mg/dL (ref 8.4–10.4)
Chloride: 106 mEq/L (ref 98–109)
Creatinine: 0.9 mg/dL (ref 0.6–1.1)
Glucose: 109 mg/dl (ref 70–140)

## 2013-05-22 MED ORDER — METHYLPREDNISOLONE (PAK) 4 MG PO TABS: ORAL_TABLET | ORAL | Status: DC

## 2013-05-22 NOTE — Telephone Encounter (Signed)
Per Dr Julien Nordmann I called pt and told her to stop gleevec for 1 week and to call us back and let us know how she is feeling. Pt voiced back instructions correctly.

## 2013-05-22 NOTE — Progress Notes (Signed)
[facility] [phone]   [phone]  OFFICE PROGRESS NOTE  Pamela House, [place], [place], [address] [place] [address]  PRINCIPAL DIAGNOSIS: Chronic myeloid leukemia diagnosed in June 2010.   CURRENT THERAPY: Gleevec 400 mg p.o. daily, except Monday and Thursday secondary to significant neutropenia with the daily dose.  INTERVAL HISTORY: Pamela House 79 y.o. female returns to the clinic today for followup visit accompanied by her daughter. The patient continues to complain of cough and shortness of breath with exertion. She was seen recently by her primary care physician Dr. Deforest Hoyles. He tolerated her blood pressure medication from ACE inhibitor to Diovan with no significant improvement in her cough. He ordered CT scan of the chest that was performed yesterday and showed no acute findings but there was subpleural pattern of pulmonary fibrosis suggestive of nonspecific interstitial pneumonitis. The patient has an appointment with Dr. Lamonte Sakai on 06/19/2013 for evaluation of her condition. She is feeling fine otherwise with no specific complaints. She denied having any significant weight loss or night sweats. She denied having any fever or chills. She is tolerating her treatment with Gleevec fairly well.  ALLERGIES:  has No Known Allergies.  MEDICATIONS:  Current Outpatient Prescriptions  Medication Sig Dispense Refill  . acetaminophen (TYLENOL) 325 MG tablet Take 650 mg by mouth every 6 (six) hours as needed.      . ALPRAZolam (XANAX) 0.5 MG tablet Take 0.5 mg by mouth 2 (two) times daily as needed.      Marland Kitchen aspirin 81 MG tablet Take 81 mg by mouth daily. 2-3 times weekly      . diltiazem (CARDIZEM CD) 240 MG 24 hr capsule Take 240 mg by mouth daily.      . diphenhydrAMINE (SOMINEX) 25 MG tablet Take 25 mg by mouth 2 (two) times daily as needed for allergies.      Marland Kitchen donepezil (ARICEPT) 5 MG tablet Take 5 mg by mouth at bedtime.      . ENSURE  (ENSURE) Take 1 Can by mouth 2 (two) times daily between meals.  5688 mL  3  . ergocalciferol (VITAMIN D2) 50000 UNITS capsule Take 50,000 Units by mouth every 30 (thirty) days.      . furosemide (LASIX) 20 MG tablet Take 20 mg by mouth daily.      Marland Kitchen imatinib (GLEEVEC) 400 MG tablet Take 1 tablet (400 mg total) by mouth daily. Take with meals and large glass of water.Caution:Chemotherapy.  30 tablet  3  . omeprazole (PRILOSEC) 20 MG capsule Take 20 mg by mouth 2 (two) times daily.      . potassium chloride (K-DUR) 10 MEQ tablet Take 10 mEq by mouth daily.      . promethazine (PHENERGAN) 25 MG tablet Take 25 mg by mouth every 6 (six) hours as needed. q 4-6h prn      . psyllium (METAMUCIL) 58.6 % powder Take 1 packet by mouth 3 (three) times daily.      . ramipril (ALTACE) 10 MG capsule Take 10 mg by mouth daily.       No current facility-administered medications for this visit.    REVIEW OF SYSTEMS:  A comprehensive review of systems was negative except for: Respiratory: positive for cough and dyspnea on exertion   PHYSICAL EXAMINATION: General appearance: alert, cooperative and no distress Head: Normocephalic, without obvious abnormality, atraumatic Neck: no adenopathy Lymph nodes: Cervical, supraclavicular, and axillary nodes normal. Resp: clear to auscultation bilaterally  Cardio: regular rate and rhythm, S1, S2 normal, no murmur, click, rub or gallop GI: soft, non-tender; bowel sounds normal; no masses,  no organomegaly Extremities: extremities normal, atraumatic, no cyanosis or edema Neurologic: Alert and oriented X 3, normal strength and tone. Normal symmetric reflexes. Normal coordination and gait  ECOG PERFORMANCE STATUS: 1 - Symptomatic but completely ambulatory  Blood pressure 190/78, pulse 58, temperature 98.9 F (37.2 C), temperature source Oral, resp. rate 18, height _0  (1.6 m), weight 141 lb 8 oz (64.184 kg).  LABORATORY DATA: Lab Results  Component Value Date   WBC  3.8* 05/22/2013   HGB 11.3* 05/22/2013   HCT 33.6* 05/22/2013   MCV 92.8 05/22/2013   PLT 291 05/22/2013      Chemistry      Component Value Date/Time   NA 141 05/22/2013 1223   NA 139 02/10/2012 1208   K 3.5 05/22/2013 1223   K 3.5 02/10/2012 1208   CL 106 02/21/2013 1355   CL 103 02/10/2012 1208   CO2 26 05/22/2013 1223   CO2 29 02/10/2012 1208   BUN 9.8 05/22/2013 1223   BUN 10 02/10/2012 1208   CREATININE 0.9 05/22/2013 1223   CREATININE 0.95 02/10/2012 1208      Component Value Date/Time   CALCIUM 9.4 05/22/2013 1223   CALCIUM 9.3 02/10/2012 1208   ALKPHOS 99 05/22/2013 1223   ALKPHOS 90 02/10/2012 1208   AST 16 05/22/2013 1223   AST 17 02/10/2012 1208   ALT 11 05/22/2013 1223   ALT 12 02/10/2012 1208   BILITOT 0.21 05/22/2013 1223   BILITOT 0.4 02/10/2012 1208       RADIOGRAPHIC STUDIES: Ct Chest Wo Contrast  05/21/2013   *RADIOLOGY REPORT*  Clinical Data: Cough and chest pain.  Sinus drainage.  History of leukemia.  CT CHEST WITHOUT CONTRAST  Technique:  Multidetector CT imaging of the chest was performed following the standard protocol without IV contrast.  Comparison: None.  Findings: No pathologically enlarged mediastinal or axillary lymph nodes.  Hilar regions are difficult to definitively evaluate without IV contrast.  Coronary artery calcification.  Heart size normal.  No pericardial effusion.  Biapical pleural parenchymal scarring.  A subpleural pattern of subpleural reticulation and traction bronchiolectasis is seen without zonal predominance.  No honeycombing.  Scarring along the left major fissure.  No pleural fluid.  Airway is unremarkable. This is slightly dilated throughout its course.  Incidental imaging of the upper abdomen shows no acute findings. No worrisome lytic or sclerotic lesions.  Degenerative changes are seen in the spine. Prominent Schmorl's node in the superior endplate of a lower thoracic vertebral body, with possible slight compression.  IMPRESSION:  1.  No acute findings. 2.   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   Original Report Authenticated By: Lorin Picket, M.D.    ASSESSMENT AND PLAN:  1) chronic myeloid leukemia: Currently on treatment with Gleevec and tolerating it fairly well. We will continue treatment with the same dose. The patient would come back for followup visit in 3 months with repeat CBC, comprehensive metabolic panel, LDH and molecular study for BCR/ABL 2) chronic cough and shortness of breath: Recent CT scan of the chest showed pulmonary fibrosis suggestive of nonspecific interstitial pneumonitis. Mrs. nonspecific finding but could be related to her current treatment with [place] I would consider starting the patient on Medrol Dosepak to see if that will help with her symptoms. She is scheduled to see Dr. Lamonte Sakai next  month. If no improvement in her symptoms I would consider holding her treatment with Gleevec for a short period to see if her symptoms will improve. The patient was advised to call immediately if she has any concerning symptoms in the interval.  The patient voices understanding of current disease status and treatment options and is in agreement with the current care plan.  All questions were answered. The patient knows to call the clinic with any problems, questions or concerns. We can certainly see the patient much sooner if necessary.  I spent 15 minutes counseling the patient face to face. The total time spent in the appointment was 25 minutes.

## 2013-05-22 NOTE — Telephone Encounter (Signed)
Gave pt appt for lab and Md on December per pt rqst

## 2013-05-22 NOTE — Patient Instructions (Signed)
Hold Gleevec for 1 week. We'll start Medrol Dosepak Followup visit in 3 months

## 2013-06-01 ENCOUNTER — Ambulatory Visit
Admission: RE | Admit: 2013-06-01 | Discharge: 2013-06-01 | Disposition: A | Discharge status: Home / Self Care | Payer: Medicare Other | Source: Ambulatory Visit

## 2013-06-01 DIAGNOSIS — Z1231 Encounter for screening mammogram for malignant neoplasm of breast: Secondary | ICD-10-CM

## 2013-06-18 ENCOUNTER — Encounter: Payer: Self-pay | Admitting: *Deleted

## 2013-06-19 ENCOUNTER — Encounter: Payer: Self-pay | Admitting: Emergency Medicine

## 2013-06-19 ENCOUNTER — Ambulatory Visit (INDEPENDENT_AMBULATORY_CARE_PROVIDER_SITE_OTHER): Payer: Medicare Other | Admitting: Emergency Medicine

## 2013-06-19 VITALS — BP 128/70 | HR 55 | Temp 98.7°F | Ht 63.0 in | Wt 141.0 lb

## 2013-06-19 DIAGNOSIS — R053 Chronic cough: Secondary | ICD-10-CM | POA: Insufficient documentation

## 2013-06-19 DIAGNOSIS — Z23 Encounter for immunization: Secondary | ICD-10-CM

## 2013-06-19 DIAGNOSIS — J8410 Pulmonary fibrosis, unspecified: Secondary | ICD-10-CM

## 2013-06-19 DIAGNOSIS — R05 Cough: Secondary | ICD-10-CM

## 2013-06-19 DIAGNOSIS — J849 Interstitial pulmonary disease, unspecified: Secondary | ICD-10-CM | POA: Insufficient documentation

## 2013-06-19 MED ORDER — FLUTICASONE PROPIONATE 50 MCG/ACT NA SUSP: 2.0000 | Freq: Two times a day (BID) | NASAL | Status: DC

## 2013-06-19 MED ORDER — PANTOPRAZOLE SODIUM 40 MG PO TBEC: 40.0000 mg | DELAYED_RELEASE_TABLET | Freq: Two times a day (BID) | ORAL | Status: DC

## 2013-06-19 MED ORDER — HYDROCODONE-HOMATROPINE 5-1.5 MG/5ML PO SYRP: 5.0000 mL | ORAL_SOLUTION | Freq: Four times a day (QID) | ORAL | Status: DC | PRN

## 2013-06-19 MED ORDER — LORATADINE 10 MG PO TABS: 10.0000 mg | ORAL_TABLET | Freq: Every day | ORAL | Status: DC

## 2013-06-19 NOTE — Assessment & Plan Note (Signed)
Will attempt to treat her GERD and allergic rhinitis more aggressive;ly  - protonix bid until next visit - start fluticasone + loratadine - hycodan prn

## 2013-06-19 NOTE — Assessment & Plan Note (Signed)
Etiology unclear. She will need an auto-immune eval although not clear how this will affect our management. Also not clear how these test results will be affected by being on Gleevac.  - auto-immune panel in the future.

## 2013-06-19 NOTE — Patient Instructions (Addendum)
We will change omeprazole to pantoprazole 57m twice a day Start taking loratadine 160mdaily Start taking fluticasone nasal spray, 2 sprays each side twice a day Try using hycodan 1 tbsp up to every 6 hours for your cough.  Flu Shot today Follow with Dr ByLamonte Sakain 1 month

## 2013-06-19 NOTE — Progress Notes (Signed)
Subjective:    Patient ID: Pamela House, female    DOB: 1930-03-03, 79 y.o.   MRN: [id number]  HPI 79 yo woman, never smoker, hx of GERD, CML treated by Dr Julien Nordmann with Gleevac, CAD, Hiatal hernia, dementia. She has been having cough for about a year. Seems to be associated with nasal drainage and GERD. She had a CT chest 05/21/13 that shows some mild subpleural scar without clear etiology for the cough. She had ACE-I changed to ARB last month. She has tried cough suppression. She is on omeprazole 32m bid, seems to control GERD fairly well.    Review of Systems  Constitutional: Negative for fever and unexpected weight change.  HENT: Positive for congestion and postnasal drip. Negative for ear pain, nosebleeds, sore throat, rhinorrhea, sneezing, trouble swallowing, dental problem and sinus pressure.   Eyes: Negative for redness and itching.  Respiratory: Positive for cough, chest tightness and shortness of breath. Negative for wheezing.   Cardiovascular: Negative for palpitations and leg swelling.  Gastrointestinal: Negative for nausea and vomiting.  Genitourinary: Negative for dysuria.  Musculoskeletal: Negative for joint swelling.  Skin: Negative for rash.  Neurological: Negative for headaches.  Hematological: Does not bruise/bleed easily.  Psychiatric/Behavioral: Negative for dysphoric mood. The patient is not nervous/anxious.     Past Medical History  Diagnosis Date  . Dyslipidemia   . GERD (gastroesophageal reflux disease)   . Dementia   . Anxiety   . Allergic rhinitis   . Hypertension   . DDD (degenerative disc disease)   . Osteoporosis   . Vitamin D deficiency   . Leukemia      No family history on file.   History   Social History  . Marital Status: Widowed    Spouse Name: N/A    Number of Children: N/A  . Years of Education: N/A   Occupational History  . Not on file.   Social History Main Topics  . Smoking status: Never Smoker   . Smokeless tobacco: Not on  file  . Alcohol Use: No  . Drug Use: No  . Sexual Activity: Not on file   Other Topics Concern  . Not on file   Social History Narrative  . No narrative on file     Allergies  Allergen Reactions  . Ace Inhibitors   . Hyzaar [Losartan Potassium-Hctz]   . Lipitor [Atorvastatin]   . Losartan   . Tiazac [Diltiazem Hcl Er Beads]      Outpatient Prescriptions Prior to Visit  Medication Sig Dispense Refill  . ALPRAZolam (XANAX) 0.5 MG tablet Take 0.5 mg by mouth 2 (two) times daily as needed.      .Marland Kitchenaspirin 81 MG tablet Take 81 mg by mouth daily. 2-3 times weekly      . diltiazem (CARDIZEM CD) 240 MG 24 hr capsule Take 240 mg by mouth daily.      .Marland Kitchendonepezil (ARICEPT) 5 MG tablet Take 5 mg by mouth at bedtime.      . ENSURE (ENSURE) Take 1 Can by mouth 2 (two) times daily between meals.  5688 mL  3  . ergocalciferol (VITAMIN D2) 50000 UNITS capsule Take 50,000 Units by mouth every 30 (thirty) days.      . furosemide (LASIX) 20 MG tablet Take 20 mg by mouth daily.      .Marland Kitchenimatinib (GLEEVEC) 400 MG tablet Take 1 tablet (400 mg total) by mouth daily. Take with meals and large glass of water.Caution:Chemotherapy.  30 tablet  3  . omeprazole (PRILOSEC) 20 MG capsule Take 20 mg by mouth 2 (two) times daily.      . potassium chloride (K-DUR) 10 MEQ tablet Take 10 mEq by mouth daily.      . promethazine (PHENERGAN) 25 MG tablet Take 25 mg by mouth every 6 (six) hours as needed. q 4-6h prn      . psyllium (METAMUCIL) 58.6 % powder Take 1 packet by mouth 3 (three) times daily.      . valsartan (DIOVAN) 160 MG tablet 160 mg daily.      . sertraline (ZOLOFT) 25 MG tablet 25 mg daily.      . methylPREDNIsolone (MEDROL DOSPACK) 4 MG tablet follow package directions  21 tablet  0   No facility-administered medications prior to visit.         Objective:   Physical Exam Filed Vitals:   06/19/13 1613  BP: 128/70  Pulse: 55  Temp: 98.7 F (37.1 C)  TempSrc: Oral  Height: _0  (1.6 m)   Weight: 141 lb (63.957 kg)  SpO2: 98%   Gen: Pleasant, elderly woman, in no distress,  normal affect  ENT: No lesions,  mouth clear,  oropharynx clear, no postnasal drip  Neck: No JVD, no TMG, no carotid bruits  Lungs: No use of accessory muscles, distant, clear without rales or rhonchi  Cardiovascular: RRR, heart sounds normal, no murmur or gallops, no peripheral edema  Musculoskeletal: No deformities, no cyanosis or clubbing  Neuro: alert, non focal, some poor memory but oriented  Skin: Warm, no lesions or rashes      Assessment & Plan:  ILD (interstitial lung disease) Etiology unclear. She will need an auto-immune eval although not clear how this will affect our management. Also not clear how these test results will be affected by being on Gleevac.  - auto-immune panel in the future.   Chronic cough Will attempt to treat her GERD and allergic rhinitis more aggressive;ly  - protonix bid until next visit - start fluticasone + loratadine - hycodan prn

## 2013-07-11 ENCOUNTER — Telehealth: Payer: Self-pay | Admitting: Emergency Medicine

## 2013-07-11 MED ORDER — HYDROCODONE-HOMATROPINE 5-1.5 MG/5ML PO SYRP: 5.0000 mL | ORAL_SOLUTION | Freq: Four times a day (QID) | ORAL | Status: DC | PRN

## 2013-07-11 NOTE — Telephone Encounter (Signed)
I spoke with daughter. Will pick up in the AM

## 2013-07-11 NOTE — Telephone Encounter (Signed)
Pt needs refill on her hycodan cough syrup. This was refilled 06/19/13 #240 ml x 0 refills. Per daughter she still have coughing attacks. Some days she does not have to take this medication. Pt has pending appt 07/30/13. Please advise RB as now RX will have to be picked up. thanks

## 2013-07-11 NOTE — Telephone Encounter (Signed)
Ok to refill x 1  

## 2013-07-30 ENCOUNTER — Ambulatory Visit (INDEPENDENT_AMBULATORY_CARE_PROVIDER_SITE_OTHER): Payer: Medicare Other | Admitting: Emergency Medicine

## 2013-07-30 ENCOUNTER — Encounter: Payer: Self-pay | Admitting: Emergency Medicine

## 2013-07-30 ENCOUNTER — Telehealth: Payer: Self-pay | Admitting: Emergency Medicine

## 2013-07-30 VITALS — BP 140/82 | HR 86 | Temp 98.0°F | Ht 63.0 in | Wt 143.8 lb

## 2013-07-30 DIAGNOSIS — R05 Cough: Secondary | ICD-10-CM

## 2013-07-30 DIAGNOSIS — J849 Interstitial pulmonary disease, unspecified: Secondary | ICD-10-CM

## 2013-07-30 DIAGNOSIS — J8410 Pulmonary fibrosis, unspecified: Secondary | ICD-10-CM

## 2013-07-30 MED ORDER — HYDROCOD POLST-CHLORPHEN POLST 10-8 MG/5ML PO LQCR: 5.0000 mL | Freq: Two times a day (BID) | ORAL | Status: DC | PRN

## 2013-07-30 NOTE — Telephone Encounter (Signed)
lmomtcb x1 

## 2013-07-30 NOTE — Telephone Encounter (Signed)
[phone] calling back please call the home number

## 2013-07-30 NOTE — Patient Instructions (Signed)
Please continue fluticasone nasal spray, 2 sprays each nostril twice a day  Continue  loratadine once a day Call Dr Lysle Rubens about changing your blood pressure medications Continue your pantoprazole 34m twice a day Stop Hycodan and start tussionex 1 tbsp up to every 12 hours if needed for cough  Follow with Dr BLamonte Sakaiin 3 months or sooner if you have any problems.

## 2013-07-30 NOTE — Progress Notes (Signed)
  Subjective:    Patient ID: Pamela House, female    DOB: 1930/06/16, 79 y.o.   MRN: [id number]  Cough Associated symptoms include postnasal drip and shortness of breath. Pertinent negatives include no ear pain, eye redness, fever, headaches, rash, rhinorrhea, sore throat or wheezing.   79 yo woman, never smoker, hx of GERD, CML treated by Dr Julien Nordmann with Gleevac, CAD, Hiatal hernia, dementia. She has been having cough for about a year. Seems to be associated with nasal drainage and GERD. She had a CT chest 05/21/13 that shows some mild subpleural scar without clear etiology for the cough. She had ACE-I changed to ARB last month. She has tried cough suppression. She is on omeprazole 44m bid, seems to control GERD fairly well.   ROV 07/30/13 -- hx chronic cough, GERD, PND, ILD. Hx of chemo for CML. Last time started fluticasone, loratadine, increased protonix to bid. She has continued to cough, more over the last 2-3 weeks. She got relief initially from hycodan, but this lost effect. Very difficult to know what she is taking - she has started and stopped pantoprazole due to ? Lightheadedness. She is taking the fluticasone bid.    Review of Systems  Constitutional: Negative for fever and unexpected weight change.  HENT: Positive for congestion and postnasal drip. Negative for dental problem, ear pain, nosebleeds, rhinorrhea, sinus pressure, sneezing, sore throat and trouble swallowing.   Eyes: Negative for redness and itching.  Respiratory: Positive for cough, chest tightness and shortness of breath. Negative for wheezing.   Cardiovascular: Negative for palpitations and leg swelling.  Gastrointestinal: Negative for nausea and vomiting.  Genitourinary: Negative for dysuria.  Musculoskeletal: Negative for joint swelling.  Skin: Negative for rash.  Neurological: Negative for headaches.  Hematological: Does not bruise/bleed easily.  Psychiatric/Behavioral: Negative for dysphoric mood. The patient  is not nervous/anxious.        Objective:   Physical Exam Filed Vitals:   07/30/13 1337  BP: 140/82  Pulse: 86  Temp: 98 F (36.7 C)  TempSrc: Oral  Height: 5' 3" (1.6 m)  Weight: 143 lb 12.8 oz (65.227 kg)  SpO2: 97%   Gen: Pleasant, elderly woman, in no distress,  normal affect  ENT: No lesions,  mouth clear,  oropharynx clear, no postnasal drip  Neck: No JVD, no TMG, no carotid bruits  Lungs: No use of accessory muscles, distant, clear without rales or rhonchi  Cardiovascular: RRR, heart sounds normal, no murmur or gallops, no peripheral edema  Musculoskeletal: No deformities, no cyanosis or clubbing  Neuro: alert, non focal, some poor memory but oriented  Skin: Warm, no lesions or rashes      Assessment & Plan:  Chronic cough Please continue the fluticasone, loratadine continue protonix bid given her hiatal hernia Try changing hycodan to tussionex.  rov 3  ILD (interstitial lung disease) No intervention at this time.

## 2013-07-30 NOTE — Assessment & Plan Note (Signed)
Please continue the fluticasone, loratadine continue protonix bid given her hiatal hernia Try changing hycodan to tussionex.  rov 3

## 2013-07-30 NOTE — Telephone Encounter (Signed)
Per RB- patient can use the Hycodan cough syrup she already has on hand(brought with her to visit today). Do not get Tussionex filled.   [name] spoke with patients daughter- they are aware.

## 2013-07-30 NOTE — Assessment & Plan Note (Signed)
No intervention at this time.

## 2013-07-30 NOTE — Telephone Encounter (Signed)
Pt was given Tussionex cough syrup rx and unable to afford; needs something cheaper. Please advise.

## 2013-08-22 ENCOUNTER — Other Ambulatory Visit: Payer: Medicare Other

## 2013-08-22 ENCOUNTER — Ambulatory Visit: Payer: Medicare Other | Admitting: Physician Assistant

## 2013-08-22 ENCOUNTER — Telehealth: Payer: Self-pay | Admitting: Internal Medicine

## 2013-08-22 NOTE — Telephone Encounter (Signed)
s.w. pt and r/s appt done

## 2013-08-23 ENCOUNTER — Telehealth: Payer: Self-pay | Admitting: Internal Medicine

## 2013-08-23 NOTE — Telephone Encounter (Signed)
s.w. pt and r/s appt per pt request...pt ok and aware

## 2013-08-24 ENCOUNTER — Other Ambulatory Visit: Payer: Medicare Other

## 2013-08-24 ENCOUNTER — Ambulatory Visit: Payer: Medicare Other | Admitting: Physician Assistant

## 2013-08-30 ENCOUNTER — Other Ambulatory Visit: Payer: Self-pay | Admitting: Physician Assistant

## 2013-08-30 DIAGNOSIS — C9210 Chronic myeloid leukemia, BCR/ABL-positive, not having achieved remission: Secondary | ICD-10-CM

## 2013-08-31 ENCOUNTER — Other Ambulatory Visit: Payer: Medicare Other

## 2013-08-31 ENCOUNTER — Ambulatory Visit: Payer: Medicare Other | Admitting: Physician Assistant

## 2013-09-26 ENCOUNTER — Other Ambulatory Visit: Payer: Self-pay | Admitting: *Deleted

## 2013-09-26 ENCOUNTER — Telehealth: Payer: Self-pay | Admitting: Internal Medicine

## 2013-09-26 DIAGNOSIS — C9210 Chronic myeloid leukemia, BCR/ABL-positive, not having achieved remission: Secondary | ICD-10-CM

## 2013-09-26 MED ORDER — IMATINIB MESYLATE 400 MG PO TABS: 400.0000 mg | ORAL_TABLET | Freq: Every day | ORAL | Status: DC

## 2013-09-26 NOTE — Telephone Encounter (Signed)
Gave pt appt for lab and MD for 1/19

## 2013-10-01 ENCOUNTER — Telehealth: Payer: Self-pay | Admitting: Internal Medicine

## 2013-10-01 ENCOUNTER — Ambulatory Visit (HOSPITAL_BASED_OUTPATIENT_CLINIC_OR_DEPARTMENT_OTHER): Payer: Medicare Other | Admitting: Physician Assistant

## 2013-10-01 ENCOUNTER — Encounter (INDEPENDENT_AMBULATORY_CARE_PROVIDER_SITE_OTHER): Payer: Self-pay

## 2013-10-01 ENCOUNTER — Encounter: Payer: Self-pay | Admitting: Physician Assistant

## 2013-10-01 ENCOUNTER — Other Ambulatory Visit (HOSPITAL_BASED_OUTPATIENT_CLINIC_OR_DEPARTMENT_OTHER): Payer: Medicare Other

## 2013-10-01 VITALS — BP 143/52 | HR 73 | Temp 98.8°F | Resp 19 | Ht 63.0 in | Wt 138.1 lb

## 2013-10-01 DIAGNOSIS — C9210 Chronic myeloid leukemia, BCR/ABL-positive, not having achieved remission: Secondary | ICD-10-CM

## 2013-10-01 DIAGNOSIS — G4700 Insomnia, unspecified: Secondary | ICD-10-CM

## 2013-10-01 DIAGNOSIS — R0602 Shortness of breath: Secondary | ICD-10-CM

## 2013-10-01 DIAGNOSIS — R059 Cough, unspecified: Secondary | ICD-10-CM

## 2013-10-01 DIAGNOSIS — R05 Cough: Secondary | ICD-10-CM

## 2013-10-01 DIAGNOSIS — D709 Neutropenia, unspecified: Secondary | ICD-10-CM

## 2013-10-01 LAB — COMPREHENSIVE METABOLIC PANEL (CC13)
ALK PHOS: 98 U/L (ref 40–150)
ALT: 14 U/L (ref 0–55)
AST: 21 U/L (ref 5–34)
Albumin: 3.9 g/dL (ref 3.5–5.0)
Anion Gap: 10 mEq/L (ref 3–11)
BUN: 14.1 mg/dL (ref 7.0–26.0)
CO2: 24 mEq/L (ref 22–29)
Calcium: 9.8 mg/dL (ref 8.4–10.4)
Chloride: 104 mEq/L (ref 98–109)
Creatinine: 0.9 mg/dL (ref 0.6–1.1)
Glucose: 124 mg/dl (ref 70–140)
Potassium: 3.9 mEq/L (ref 3.5–5.1)
SODIUM: 138 meq/L (ref 136–145)
TOTAL PROTEIN: 6.9 g/dL (ref 6.4–8.3)
Total Bilirubin: 0.27 mg/dL (ref 0.20–1.20)

## 2013-10-01 LAB — CBC WITH DIFFERENTIAL/PLATELET
BASO%: 1 % (ref 0.0–2.0)
Basophils Absolute: 0 10*3/uL (ref 0.0–0.1)
EOS ABS: 0 10*3/uL (ref 0.0–0.5)
EOS%: 0.8 % (ref 0.0–7.0)
HCT: 35.4 % (ref 34.8–46.6)
HGB: 11.7 g/dL (ref 11.6–15.9)
LYMPH#: 1.6 10*3/uL (ref 0.9–3.3)
LYMPH%: 42.5 % (ref 14.0–49.7)
MCH: 30.3 pg (ref 25.1–34.0)
MCHC: 33 g/dL (ref 31.5–36.0)
MCV: 91.6 fL (ref 79.5–101.0)
MONO#: 0.2 10*3/uL (ref 0.1–0.9)
MONO%: 4.8 % (ref 0.0–14.0)
NEUT%: 50.9 % (ref 38.4–76.8)
NEUTROS ABS: 2 10*3/uL (ref 1.5–6.5)
PLATELETS: 241 10*3/uL (ref 145–400)
RBC: 3.86 10*6/uL (ref 3.70–5.45)
RDW: 14.8 % — ABNORMAL HIGH (ref 11.2–14.5)
WBC: 3.9 10*3/uL (ref 3.9–10.3)

## 2013-10-01 MED ORDER — ENSURE PO LIQD: 1.0000 | Freq: Two times a day (BID) | ORAL | Status: DC

## 2013-10-01 MED ORDER — ZOLPIDEM TARTRATE 5 MG PO TABS
5.0000 mg | ORAL_TABLET | Freq: Every evening | ORAL | Status: DC | PRN
Start: 2013-10-01 — End: 2014-02-18

## 2013-10-01 NOTE — Patient Instructions (Signed)
Continue taking Gleevec 400 mg by mouth on Tuesdays, Thursdays, Saturdays and Sundays Follow up with Dr. Julien Nordmann in 3 months

## 2013-10-01 NOTE — Progress Notes (Addendum)
[place] [phone]   Fax:(336) [place] [employer], [address] [address]  PRINCIPAL DIAGNOSIS: Chronic myeloid leukemia diagnosed in June 2010.   CURRENT THERAPY: Gleevec 400 mg p.o. daily, except Monday and Wednesday secondary to significant neutropenia with the daily dose.  INTERVAL HISTORY: Pamela House 81 y.o. female returns to the clinic today for followup visit unaccompanied. She reports an upper respiratory infection that has been treated by Dr. Deforest Hoyles. She had persistent cough but was unable to avoid the Tussionex prescription. Her symptoms have gradually dissipated. She requests a new prescription for Ensure dietary supplements. She was able to find an establishment that will fill the prescription. She is to follow up with Dr. Lamonte Sakai in February 2015. She is feeling fine otherwise with no specific complaints. She denied having any significant weight loss or night sweats. She denied having any fever or chills. She is tolerating her treatment with Gleevec fairly well.  ALLERGIES:  is allergic to ace inhibitors; hyzaar; lipitor; losartan; and tiazac.  MEDICATIONS:  Current Outpatient Prescriptions  Medication Sig Dispense Refill  . ALPRAZolam (XANAX) 0.5 MG tablet Take 0.5 mg by mouth 2 (two) times daily as needed.      Marland Kitchen aspirin 81 MG tablet Take 81 mg by mouth daily. 2-3 times weekly      . diltiazem (CARDIZEM CD) 240 MG 24 hr capsule Take 240 mg by mouth daily.      Marland Kitchen donepezil (ARICEPT) 5 MG tablet Take 5 mg by mouth at bedtime.      . ENSURE (ENSURE) Take 1 Can by mouth 2 (two) times daily between meals.  5688 mL  3  . ergocalciferol (VITAMIN D2) 50000 UNITS capsule Take 50,000 Units by mouth every 30 (thirty) days.      . fluticasone (FLONASE) 50 MCG/ACT nasal spray Place 2 sprays into the nose 2 (two) times daily.  16 g  2  . furosemide (LASIX) 20 MG tablet Take 20 mg by  mouth daily.      Marland Kitchen imatinib (GLEEVEC) 400 MG tablet Take 1 tablet (400 mg total) by mouth daily. Take with meals and large glass of water.Caution:Chemotherapy.  30 tablet  0  . loratadine (CLARITIN) 10 MG tablet Take 1 tablet (10 mg total) by mouth daily.  30 tablet  3  . omeprazole (PRILOSEC) 20 MG capsule 1 capsule daily.      . potassium chloride (K-DUR) 10 MEQ tablet Take 10 mEq by mouth daily.      . psyllium (METAMUCIL) 58.6 % powder Take 1 packet by mouth 3 (three) times daily.      . sertraline (ZOLOFT) 25 MG tablet 25 mg daily.      . valsartan (DIOVAN) 160 MG tablet 160 mg daily.      . chlorpheniramine-HYDROcodone (TUSSIONEX PENNKINETIC ER) 10-8 MG/5ML LQCR Take 5 mLs by mouth every 12 (twelve) hours as needed for cough.  480 mL  0  . escitalopram (LEXAPRO) 5 MG tablet Take 5 mg by mouth daily.      Marland Kitchen HYDROcodone-homatropine (HYCODAN) 5-1.5 MG/5ML syrup Take 5 mLs by mouth every 6 (six) hours as needed for cough.  240 mL  0  . pantoprazole (PROTONIX) 40 MG tablet Take 1 tablet (40 mg total) by mouth 2 (two) times daily.  30 tablet  3  . promethazine (PHENERGAN) 25 MG tablet Take 25 mg by mouth every 6 (six) hours  as needed. q 4-6h prn      . zolpidem (AMBIEN) 5 MG tablet Take 1 tablet (5 mg total) by mouth at bedtime as needed for sleep.  30 tablet  0   No current facility-administered medications for this visit.    REVIEW OF SYSTEMS:  A comprehensive review of systems was negative except for: Respiratory: positive for cough and dyspnea on exertion   PHYSICAL EXAMINATION: General appearance: alert, cooperative and no distress Head: Normocephalic, without obvious abnormality, atraumatic Neck: no adenopathy Lymph nodes: Cervical, supraclavicular, and axillary nodes normal. Resp: clear to auscultation bilaterally Cardio: regular rate and rhythm, S1, S2 normal, no murmur, click, rub or gallop GI: soft, non-tender; bowel sounds normal; no masses,  no organomegaly Extremities:  extremities normal, atraumatic, no cyanosis or edema Neurologic: Alert and oriented X 3, normal strength and tone. Normal symmetric reflexes. Normal coordination and gait  ECOG PERFORMANCE STATUS: 1 - Symptomatic but completely ambulatory  Blood pressure 143/52, pulse 73, temperature 98.8 F (37.1 C), temperature source Oral, resp. rate 19, height _0  (1.6 m), weight 138 lb 1.6 oz (62.642 kg).  LABORATORY DATA: Lab Results  Component Value Date   WBC 3.9 10/01/2013   HGB 11.7 10/01/2013   HCT 35.4 10/01/2013   MCV 91.6 10/01/2013   PLT 241 10/01/2013      Chemistry      Component Value Date/Time   NA 138 10/01/2013 1410   NA 139 02/10/2012 1208   K 3.9 10/01/2013 1410   K 3.5 02/10/2012 1208   CL 106 02/21/2013 1355   CL 103 02/10/2012 1208   CO2 24 10/01/2013 1410   CO2 29 02/10/2012 1208   BUN 14.1 10/01/2013 1410   BUN 10 02/10/2012 1208   CREATININE 0.9 10/01/2013 1410   CREATININE 0.95 02/10/2012 1208      Component Value Date/Time   CALCIUM 9.8 10/01/2013 1410   CALCIUM 9.3 02/10/2012 1208   ALKPHOS 98 10/01/2013 1410   ALKPHOS 90 02/10/2012 1208   AST 21 10/01/2013 1410   AST 17 02/10/2012 1208   ALT 14 10/01/2013 1410   ALT 12 02/10/2012 1208   BILITOT 0.27 10/01/2013 1410   BILITOT 0.4 02/10/2012 1208       RADIOGRAPHIC STUDIES: Ct Chest Wo Contrast  05/21/2013   *RADIOLOGY REPORT*  Clinical Data: Cough and chest pain.  Sinus drainage.  History of leukemia.  CT CHEST WITHOUT CONTRAST  Technique:  Multidetector CT imaging of the chest was performed following the standard protocol without IV contrast.  Comparison: None.  Findings: No pathologically enlarged mediastinal or axillary lymph nodes.  Hilar regions are difficult to definitively evaluate without IV contrast.  Coronary artery calcification.  Heart size normal.  No pericardial effusion.  Biapical pleural parenchymal scarring.  A subpleural pattern of subpleural reticulation and traction bronchiolectasis is seen without zonal  predominance.  No honeycombing.  Scarring along the left major fissure.  No pleural fluid.  Airway is unremarkable. This is slightly dilated throughout its course.  Incidental imaging of the upper abdomen shows no acute findings. No worrisome lytic or sclerotic lesions.  Degenerative changes are seen in the spine. Prominent Schmorl's node in the superior endplate of a lower thoracic vertebral body, with possible slight compression.  IMPRESSION:  1.  No acute findings. 2.  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   Original Report Authenticated By: Lorin Picket, M.D.    ASSESSMENT AND PLAN:   Patient was discussed  with and also seen by Dr. Julien Nordmann.  1) chronic myeloid leukemia: Currently on treatment with Gleevec and tolerating it fairly well. We will continue treatment with the same dose. A BCR/ABL is pending from today. The patient will followup in 3 months with repeat CBC, comprehensive metabolic panel, LDH. 2) chronic cough and shortness of breath:  She is scheduled to see Dr. Lamonte Sakai next month.  She was given refill prescriptions for her Ensure dietary supplements. He patient complained of some problems with sleeping and she was given a prescription for Ambien 5 mg by mouth at bedtime as needed for insomnia, total 30 with no refill. She will likely need another refill of her Gleevec at before she returns in 3 months we will address this will get the requests from her pharmacy.  The patient was advised to call immediately if she has any concerning symptoms in the interval.  The patient voices understanding of current disease status and treatment options and is in agreement with the current care plan.  All questions were answered. The patient knows to call the clinic with any problems, questions or concerns. We can certainly see the patient much sooner if necessary.  Carlton Adam PA-C  ADDENDUM: Hematology/Oncology  Attending: I had a face to face encounter with the patient. I recommended her care plan. She is a very pleasant 80 years old [place] American female with history of chronic myeloid leukemia currently on treatment with Gleevec 400 mg by mouth daily except Monday and Wednesday secondary to neutropenia. The patient is tolerating her treatment fairly well with no significant adverse effects. She was advised to continue his current treatment with Gleevec at the same dose. I would see her back for followup visit in 3 months after repeating molecular study for BCR/ABL. For insomnia, the patient was given prescription for Ambien 5 mg by mouth daily. She was advised to call immediately if she has any concerning symptoms in the interval.  Disclaimer: This note was dictated with voice recognition software. Similar sounding words can inadvertently be transcribed and may not be corrected upon review. Eilleen Kempf., MD 10/02/2013

## 2013-10-01 NOTE — Telephone Encounter (Signed)
Gave pt appt for lab and MD for APril [id number], pt will come back tomorrow for todays lab due ton send out. Pt will come back tomorrow

## 2013-10-02 ENCOUNTER — Other Ambulatory Visit (HOSPITAL_BASED_OUTPATIENT_CLINIC_OR_DEPARTMENT_OTHER): Payer: Medicare Other

## 2013-10-02 DIAGNOSIS — C9210 Chronic myeloid leukemia, BCR/ABL-positive, not having achieved remission: Secondary | ICD-10-CM

## 2013-10-12 LAB — BCR/ABL

## 2013-11-26 ENCOUNTER — Other Ambulatory Visit: Payer: Self-pay | Admitting: *Deleted

## 2013-11-26 NOTE — Telephone Encounter (Signed)
THIS REFILL REQUEST FOR GLEEVEC WAS GIVEN TO DR.MOHAMED'S NURSE, STEPHANIE JOHNSON,RN.

## 2013-11-27 ENCOUNTER — Other Ambulatory Visit: Payer: Self-pay | Admitting: *Deleted

## 2013-11-27 DIAGNOSIS — C9210 Chronic myeloid leukemia, BCR/ABL-positive, not having achieved remission: Secondary | ICD-10-CM

## 2013-11-27 MED ORDER — IMATINIB MESYLATE 400 MG PO TABS: 400.0000 mg | ORAL_TABLET | Freq: Every day | ORAL | Status: DC

## 2013-12-31 ENCOUNTER — Ambulatory Visit: Payer: Medicare Other | Admitting: Internal Medicine

## 2013-12-31 ENCOUNTER — Other Ambulatory Visit: Payer: Medicare Other

## 2014-01-01 ENCOUNTER — Telehealth: Payer: Self-pay | Admitting: Internal Medicine

## 2014-01-01 NOTE — Telephone Encounter (Signed)
pt called to r/s missed appt...done....pt aware of new d.t

## 2014-01-21 ENCOUNTER — Telehealth: Payer: Self-pay | Admitting: Internal Medicine

## 2014-01-21 ENCOUNTER — Ambulatory Visit: Payer: Medicare Other | Admitting: Internal Medicine

## 2014-01-21 ENCOUNTER — Other Ambulatory Visit: Payer: Medicare Other

## 2014-01-21 NOTE — Telephone Encounter (Signed)
PT CALLED AND R/S APPTS TODAY TO 6/8. PT CALLED PAST TIME OF APPT. PT HAS NEW D/T FOR 6/8 LB/MM @ 2:45PM.

## 2014-02-18 ENCOUNTER — Other Ambulatory Visit (HOSPITAL_BASED_OUTPATIENT_CLINIC_OR_DEPARTMENT_OTHER): Payer: Medicare Other

## 2014-02-18 ENCOUNTER — Telehealth: Payer: Self-pay | Admitting: Internal Medicine

## 2014-02-18 ENCOUNTER — Ambulatory Visit (HOSPITAL_BASED_OUTPATIENT_CLINIC_OR_DEPARTMENT_OTHER): Payer: Medicare Other | Admitting: Internal Medicine

## 2014-02-18 ENCOUNTER — Encounter: Payer: Self-pay | Admitting: Internal Medicine

## 2014-02-18 VITALS — BP 135/45 | HR 60 | Temp 98.0°F | Resp 17 | Ht 63.0 in | Wt 137.4 lb

## 2014-02-18 DIAGNOSIS — R350 Frequency of micturition: Secondary | ICD-10-CM

## 2014-02-18 DIAGNOSIS — C9210 Chronic myeloid leukemia, BCR/ABL-positive, not having achieved remission: Secondary | ICD-10-CM

## 2014-02-18 NOTE — Telephone Encounter (Signed)
Gave pt appt for lab and MD for june and September, will call urology tomorrow for referral, gave referral to HIM

## 2014-02-18 NOTE — Progress Notes (Signed)
[place] Telephone:([phone]   Fax:(336) [place] [employer], [address] [address]  PRINCIPAL DIAGNOSIS: Chronic myeloid leukemia diagnosed in 2009-03-19.   CURRENT THERAPY: Gleevec 400 mg p.o. for days a week on Saturday, Sunday, Wednesday and Friday secondary to significant neutropenia with the daily dose.  INTERVAL HISTORY: Pamela House 80 y.o. female returns to the clinic today for followup visit. She is feeling fine today with no specific complaints except for aching pain in her shoulders in addition to dry cough and she is currently on Robitussin. She also has increased frequency of urination.  She is feeling fine otherwise with no specific complaints. She denied having any significant weight loss or night sweats. She denied having any fever or chills. She is tolerating her treatment with Gleevec fairly well. She had molecular studies for BCR/ABL performed in 03/19/2014 that showed no detectable fusion protein. She missed her lab appointment today because she came late.  ALLERGIES:  is allergic to ace inhibitors; hyzaar; lipitor; losartan; and tiazac.  MEDICATIONS:  Current Outpatient Prescriptions  Medication Sig Dispense Refill  . ALPRAZolam (XANAX) 0.5 MG tablet Take 0.5 mg by mouth 2 (two) times daily as needed.      Marland Kitchen aspirin 81 MG tablet Take 81 mg by mouth daily. 2-3 times weekly      . chlorpheniramine-HYDROcodone (TUSSIONEX PENNKINETIC ER) 10-8 MG/5ML LQCR Take 5 mLs by mouth every 12 (twelve) hours as needed for cough.  480 mL  0  . diltiazem (CARDIZEM CD) 240 MG 24 hr capsule Take 240 mg by mouth daily.      Marland Kitchen donepezil (ARICEPT) 5 MG tablet Take 5 mg by mouth at bedtime.      . ENSURE (ENSURE) Take 1 Can by mouth 2 (two) times daily between meals.  5688 mL  3  . ergocalciferol (VITAMIN D2) 50000 UNITS capsule Take 50,000 Units by mouth every 30 (thirty) days.      Marland Kitchen  escitalopram (LEXAPRO) 5 MG tablet Take 5 mg by mouth daily.      . fluticasone (FLONASE) 50 MCG/ACT nasal spray Place 2 sprays into the nose 2 (two) times daily.  16 g  2  . furosemide (LASIX) 20 MG tablet Take 20 mg by mouth daily.      Marland Kitchen HYDROcodone-homatropine (HYCODAN) 5-1.5 MG/5ML syrup Take 5 mLs by mouth every 6 (six) hours as needed for cough.  240 mL  0  . imatinib (GLEEVEC) 400 MG tablet Take 1 tablet (400 mg total) by mouth daily. Take with meals and large glass of water.Caution:Chemotherapy.  30 tablet  1  . loratadine (CLARITIN) 10 MG tablet Take 1 tablet (10 mg total) by mouth daily.  30 tablet  3  . omeprazole (PRILOSEC) 20 MG capsule 1 capsule daily.      . pantoprazole (PROTONIX) 40 MG tablet Take 1 tablet (40 mg total) by mouth 2 (two) times daily.  30 tablet  3  . potassium chloride (K-DUR) 10 MEQ tablet Take 10 mEq by mouth daily.      . promethazine (PHENERGAN) 25 MG tablet Take 25 mg by mouth every 6 (six) hours as needed. q 4-6h prn      . psyllium (METAMUCIL) 58.6 % powder Take 1 packet by mouth 3 (three) times daily.      . sertraline (ZOLOFT) 25 MG tablet 25 mg daily.      Marland Kitchen  valsartan (DIOVAN) 160 MG tablet 160 mg daily.      Marland Kitchen zolpidem (AMBIEN) 5 MG tablet Take 1 tablet (5 mg total) by mouth at bedtime as needed for sleep.  30 tablet  0   No current facility-administered medications for this visit.    REVIEW OF SYSTEMS:  A comprehensive review of systems was negative except for: Constitutional: positive for fatigue Respiratory: positive for cough Genitourinary: positive for frequency and urinary incontinence   PHYSICAL EXAMINATION: General appearance: alert, cooperative and no distress Head: Normocephalic, without obvious abnormality, atraumatic Neck: no adenopathy Lymph nodes: Cervical, supraclavicular, and axillary nodes normal. Resp: clear to auscultation bilaterally Cardio: regular rate and rhythm, S1, S2 normal, no murmur, click, rub or gallop GI: soft,  non-tender; bowel sounds normal; no masses,  no organomegaly Extremities: extremities normal, atraumatic, no cyanosis or edema Neurologic: Alert and oriented X 3, normal strength and tone. Normal symmetric reflexes. Normal coordination and gait  ECOG PERFORMANCE STATUS: 2 - Symptomatic, <50% confined to bed  Blood pressure 135/45, pulse 60, temperature 98 F (36.7 C), temperature source Oral, resp. rate 17, height _0  (1.6 m), weight 137 lb 6.4 oz (62.324 kg), SpO2 98.00%.  LABORATORY DATA: Lab Results  Component Value Date   WBC 3.9 10/01/2013   HGB 11.7 10/01/2013   HCT 35.4 10/01/2013   MCV 91.6 10/01/2013   PLT 241 10/01/2013      Chemistry      Component Value Date/Time   NA 138 10/01/2013 1410   NA 139 02/10/2012 1208   K 3.9 10/01/2013 1410   K 3.5 02/10/2012 1208   CL 106 02/21/2013 1355   CL 103 02/10/2012 1208   CO2 24 10/01/2013 1410   CO2 29 02/10/2012 1208   BUN 14.1 10/01/2013 1410   BUN 10 02/10/2012 1208   CREATININE 0.9 10/01/2013 1410   CREATININE 0.95 02/10/2012 1208      Component Value Date/Time   CALCIUM 9.8 10/01/2013 1410   CALCIUM 9.3 02/10/2012 1208   ALKPHOS 98 10/01/2013 1410   ALKPHOS 90 02/10/2012 1208   AST 21 10/01/2013 1410   AST 17 02/10/2012 1208   ALT 14 10/01/2013 1410   ALT 12 02/10/2012 1208   BILITOT 0.27 10/01/2013 1410   BILITOT 0.4 02/10/2012 1208       RADIOGRAPHIC STUDIES:  ASSESSMENT AND PLAN:  1) chronic myeloid leukemia: Currently on treatment with Gleevec and tolerating it fairly well. We will continue treatment with the same dose. Her recent molecular study for BCR/ABL showed no detectable fusion protein. The patient will have repeat CBC, comprehensive metabolic panel and LDH in one week and then in 3 months with her next visit. 2) Urinary frequency: I will refer her to urology for evaluation. The patient was advised to call immediately if she has any concerning symptoms in the interval.   The patient voices understanding of current  disease status and treatment options and is in agreement with the current care plan.  All questions were answered. The patient knows to call the clinic with any problems, questions or concerns. We can certainly see the patient much sooner if necessary.  Disclaimer: This note was dictated with voice recognition software. Similar sounding words can inadvertently be transcribed and may not be corrected upon review.

## 2014-02-19 ENCOUNTER — Telehealth: Payer: Self-pay | Admitting: Internal Medicine

## 2014-02-19 NOTE — Telephone Encounter (Signed)
Faxed pt medical records to [facility]

## 2014-02-25 ENCOUNTER — Other Ambulatory Visit: Payer: Medicare Other

## 2014-02-25 ENCOUNTER — Ambulatory Visit (HOSPITAL_BASED_OUTPATIENT_CLINIC_OR_DEPARTMENT_OTHER): Payer: Medicare Other

## 2014-02-25 DIAGNOSIS — C9210 Chronic myeloid leukemia, BCR/ABL-positive, not having achieved remission: Secondary | ICD-10-CM

## 2014-02-25 LAB — CBC WITH DIFFERENTIAL/PLATELET
BASO%: 0.2 % (ref 0.0–2.0)
Basophils Absolute: 0 10*3/uL (ref 0.0–0.1)
EOS%: 1.2 % (ref 0.0–7.0)
Eosinophils Absolute: 0.1 10*3/uL (ref 0.0–0.5)
HCT: 35.3 % (ref 34.8–46.6)
HGB: 11.7 g/dL (ref 11.6–15.9)
LYMPH#: 1.6 10*3/uL (ref 0.9–3.3)
LYMPH%: 32.4 % (ref 14.0–49.7)
MCH: 29.4 pg (ref 25.1–34.0)
MCHC: 33.1 g/dL (ref 31.5–36.0)
MCV: 88.7 fL (ref 79.5–101.0)
MONO#: 0.2 10*3/uL (ref 0.1–0.9)
MONO%: 4.5 % (ref 0.0–14.0)
NEUT#: 3 10*3/uL (ref 1.5–6.5)
NEUT%: 61.7 % (ref 38.4–76.8)
Platelets: 239 10*3/uL (ref 145–400)
RBC: 3.98 10*6/uL (ref 3.70–5.45)
RDW: 13.6 % (ref 11.2–14.5)
WBC: 4.9 10*3/uL (ref 3.9–10.3)

## 2014-02-25 LAB — COMPREHENSIVE METABOLIC PANEL (CC13)
ALBUMIN: 4.1 g/dL (ref 3.5–5.0)
ALT: 18 U/L (ref 0–55)
AST: 20 U/L (ref 5–34)
Alkaline Phosphatase: 112 U/L (ref 40–150)
Anion Gap: 9 mEq/L (ref 3–11)
BILIRUBIN TOTAL: 0.26 mg/dL (ref 0.20–1.20)
BUN: 12.9 mg/dL (ref 7.0–26.0)
CO2: 27 meq/L (ref 22–29)
Calcium: 9.8 mg/dL (ref 8.4–10.4)
Chloride: 106 mEq/L (ref 98–109)
Creatinine: 0.9 mg/dL (ref 0.6–1.1)
Glucose: 89 mg/dl (ref 70–140)
POTASSIUM: 3.8 meq/L (ref 3.5–5.1)
SODIUM: 141 meq/L (ref 136–145)
TOTAL PROTEIN: 7 g/dL (ref 6.4–8.3)

## 2014-02-25 LAB — LACTATE DEHYDROGENASE (CC13): LDH: 229 U/L (ref 125–245)

## 2014-03-18 ENCOUNTER — Other Ambulatory Visit: Payer: Self-pay | Admitting: *Deleted

## 2014-03-18 NOTE — Telephone Encounter (Signed)
THIS REFILL REQUEST FOR GLEEVEC WAS GIVEN TO DR.MOHAMED'S NURSE, DIANE BELL,RN.

## 2014-03-19 ENCOUNTER — Other Ambulatory Visit: Payer: Self-pay | Admitting: Medical Oncology

## 2014-03-19 DIAGNOSIS — C9210 Chronic myeloid leukemia, BCR/ABL-positive, not having achieved remission: Secondary | ICD-10-CM

## 2014-03-19 MED ORDER — IMATINIB MESYLATE 400 MG PO TABS: 400.0000 mg | ORAL_TABLET | Freq: Every day | ORAL | Status: DC

## 2014-03-19 NOTE — Telephone Encounter (Signed)
Gleevec refilled .

## 2014-04-30 ENCOUNTER — Other Ambulatory Visit: Payer: Self-pay | Admitting: Internal Medicine

## 2014-04-30 DIAGNOSIS — Z1231 Encounter for screening mammogram for malignant neoplasm of breast: Secondary | ICD-10-CM

## 2014-05-21 ENCOUNTER — Telehealth: Payer: Self-pay | Admitting: Internal Medicine

## 2014-05-21 ENCOUNTER — Other Ambulatory Visit: Payer: Medicare Other

## 2014-05-21 ENCOUNTER — Ambulatory Visit: Payer: Medicare Other | Admitting: Internal Medicine

## 2014-05-21 NOTE — Telephone Encounter (Signed)
retunred pt call and r/s appt per pt request...pt ok and aware

## 2014-06-03 ENCOUNTER — Ambulatory Visit: Payer: Medicare Other

## 2014-06-05 ENCOUNTER — Ambulatory Visit: Payer: Medicare Other

## 2014-06-12 ENCOUNTER — Telehealth: Payer: Self-pay | Admitting: Internal Medicine

## 2014-06-12 ENCOUNTER — Encounter (HOSPITAL_COMMUNITY): Payer: Self-pay | Admitting: Emergency Medicine

## 2014-06-12 ENCOUNTER — Emergency Department (INDEPENDENT_AMBULATORY_CARE_PROVIDER_SITE_OTHER)
Admission: EM | Admit: 2014-06-12 | Discharge: 2014-06-12 | Disposition: A | Discharge status: Nursing Home (Includes ICF) | Payer: Medicare Other | Source: Home / Self Care | Attending: Emergency Medicine | Admitting: Emergency Medicine

## 2014-06-12 ENCOUNTER — Emergency Department (HOSPITAL_COMMUNITY): Payer: Medicare Other

## 2014-06-12 ENCOUNTER — Emergency Department (HOSPITAL_COMMUNITY)
Admission: EM | Admit: 2014-06-12 | Discharge: 2014-06-13 | Disposition: A | Discharge status: Home / Self Care | Payer: Medicare Other | Attending: Emergency Medicine | Admitting: Emergency Medicine

## 2014-06-12 DIAGNOSIS — Z8659 Personal history of other mental and behavioral disorders: Secondary | ICD-10-CM | POA: Diagnosis not present

## 2014-06-12 DIAGNOSIS — F411 Generalized anxiety disorder: Secondary | ICD-10-CM | POA: Diagnosis not present

## 2014-06-12 DIAGNOSIS — R509 Fever, unspecified: Secondary | ICD-10-CM | POA: Diagnosis not present

## 2014-06-12 DIAGNOSIS — E559 Vitamin D deficiency, unspecified: Secondary | ICD-10-CM | POA: Diagnosis not present

## 2014-06-12 DIAGNOSIS — R05 Cough: Secondary | ICD-10-CM | POA: Diagnosis not present

## 2014-06-12 DIAGNOSIS — I10 Essential (primary) hypertension: Secondary | ICD-10-CM | POA: Diagnosis not present

## 2014-06-12 DIAGNOSIS — IMO0001 Reserved for inherently not codable concepts without codable children: Secondary | ICD-10-CM | POA: Diagnosis not present

## 2014-06-12 DIAGNOSIS — K219 Gastro-esophageal reflux disease without esophagitis: Secondary | ICD-10-CM | POA: Diagnosis not present

## 2014-06-12 DIAGNOSIS — M25579 Pain in unspecified ankle and joints of unspecified foot: Secondary | ICD-10-CM | POA: Diagnosis present

## 2014-06-12 DIAGNOSIS — M810 Age-related osteoporosis without current pathological fracture: Secondary | ICD-10-CM | POA: Diagnosis not present

## 2014-06-12 DIAGNOSIS — R5381 Other malaise: Secondary | ICD-10-CM | POA: Diagnosis not present

## 2014-06-12 DIAGNOSIS — Z7982 Long term (current) use of aspirin: Secondary | ICD-10-CM | POA: Insufficient documentation

## 2014-06-12 DIAGNOSIS — R5383 Other fatigue: Secondary | ICD-10-CM | POA: Diagnosis not present

## 2014-06-12 DIAGNOSIS — S9032XA Contusion of left foot, initial encounter: Secondary | ICD-10-CM

## 2014-06-12 DIAGNOSIS — R531 Weakness: Secondary | ICD-10-CM

## 2014-06-12 DIAGNOSIS — R110 Nausea: Secondary | ICD-10-CM

## 2014-06-12 DIAGNOSIS — J309 Allergic rhinitis, unspecified: Secondary | ICD-10-CM | POA: Insufficient documentation

## 2014-06-12 DIAGNOSIS — M519 Unspecified thoracic, thoracolumbar and lumbosacral intervertebral disc disorder: Secondary | ICD-10-CM | POA: Insufficient documentation

## 2014-06-12 DIAGNOSIS — M791 Myalgia, unspecified site: Secondary | ICD-10-CM

## 2014-06-12 DIAGNOSIS — C9210 Chronic myeloid leukemia, BCR/ABL-positive, not having achieved remission: Secondary | ICD-10-CM

## 2014-06-12 DIAGNOSIS — Z79899 Other long term (current) drug therapy: Secondary | ICD-10-CM | POA: Diagnosis not present

## 2014-06-12 DIAGNOSIS — M6080 Other myositis, unspecified site: Secondary | ICD-10-CM | POA: Insufficient documentation

## 2014-06-12 DIAGNOSIS — F419 Anxiety disorder, unspecified: Secondary | ICD-10-CM | POA: Insufficient documentation

## 2014-06-12 DIAGNOSIS — M7981 Nontraumatic hematoma of soft tissue: Secondary | ICD-10-CM | POA: Insufficient documentation

## 2014-06-12 DIAGNOSIS — Z856 Personal history of leukemia: Secondary | ICD-10-CM | POA: Diagnosis not present

## 2014-06-12 DIAGNOSIS — IMO0002 Reserved for concepts with insufficient information to code with codable children: Secondary | ICD-10-CM | POA: Diagnosis not present

## 2014-06-12 DIAGNOSIS — R059 Cough, unspecified: Secondary | ICD-10-CM | POA: Diagnosis not present

## 2014-06-12 DIAGNOSIS — M25572 Pain in left ankle and joints of left foot: Secondary | ICD-10-CM | POA: Insufficient documentation

## 2014-06-12 LAB — COMPREHENSIVE METABOLIC PANEL
ALBUMIN: 4.3 g/dL (ref 3.5–5.2)
ALT: 15 U/L (ref 0–35)
AST: 38 U/L — AB (ref 0–37)
Alkaline Phosphatase: 106 U/L (ref 39–117)
Anion gap: 14 (ref 5–15)
BUN: 7 mg/dL (ref 6–23)
CALCIUM: 9.7 mg/dL (ref 8.4–10.5)
CHLORIDE: 101 meq/L (ref 96–112)
CO2: 24 mEq/L (ref 19–32)
Creatinine, Ser: 0.66 mg/dL (ref 0.50–1.10)
GFR calc Af Amer: >90 mL/min (ref >90)
GFR calc non Af Amer: 80 mL/min — ABNORMAL LOW (ref >90)
Glucose, Bld: 107 mg/dL — ABNORMAL HIGH (ref 70–99)
Potassium: 4.1 mEq/L (ref 3.7–5.3)
Sodium: 139 mEq/L (ref 137–147)
TOTAL PROTEIN: 8.4 g/dL — AB (ref 6.0–8.3)
Total Bilirubin: 0.4 mg/dL (ref 0.3–1.2)

## 2014-06-12 LAB — URINALYSIS, ROUTINE W REFLEX MICROSCOPIC
BILIRUBIN URINE: NEGATIVE
Glucose, UA: NEGATIVE mg/dL
Hgb urine dipstick: NEGATIVE
Ketones, ur: NEGATIVE mg/dL
Leukocytes, UA: NEGATIVE
NITRITE: NEGATIVE
PH: 6.5 (ref 5.0–8.0)
Protein, ur: NEGATIVE mg/dL
Specific Gravity, Urine: 1.007 (ref 1.005–1.030)
Urobilinogen, UA: 0.2 mg/dL (ref 0.0–1.0)

## 2014-06-12 LAB — CBC WITH DIFFERENTIAL/PLATELET
BASOS ABS: 0 10*3/uL (ref 0.0–0.1)
BASOS PCT: 1 % (ref 0–1)
EOS ABS: 0 10*3/uL (ref 0.0–0.7)
EOS PCT: 1 % (ref 0–5)
HEMATOCRIT: 37 % (ref 36.0–46.0)
Hemoglobin: 12.6 g/dL (ref 12.0–15.0)
Lymphocytes Relative: 39 % (ref 12–46)
Lymphs Abs: 1.7 10*3/uL (ref 0.7–4.0)
MCH: 30 pg (ref 26.0–34.0)
MCHC: 34.1 g/dL (ref 30.0–36.0)
MCV: 88.1 fL (ref 78.0–100.0)
MONO ABS: 0.3 10*3/uL (ref 0.1–1.0)
MONOS PCT: 6 % (ref 3–12)
NEUTROS ABS: 2.3 10*3/uL (ref 1.7–7.7)
Neutrophils Relative %: 53 % (ref 43–77)
Platelets: 295 10*3/uL (ref 150–400)
RBC: 4.2 MIL/uL (ref 3.87–5.11)
RDW: 13.8 % (ref 11.5–15.5)
WBC: 4.3 10*3/uL (ref 4.0–10.5)

## 2014-06-12 NOTE — ED Notes (Signed)
Pt transferred from [facility]. Pt presents with Left foot pain and swelling since Sunday after using some cleaning solution. Pt also reports weakness, hot flashes, "feeling feverish," and generalizsed weakness. Pt has a hx of Leukemia they sent her here for further evaluation and r/o blood clots or CVA

## 2014-06-12 NOTE — ED Provider Notes (Signed)
CSN: [id number]     Arrival date & time 06/12/14  1641 History   First MD Initiated Contact with Patient 06/12/14 1730     Chief Complaint  Patient presents with  . Foot Problem   (Consider location/radiation/quality/duration/timing/severity/associated sxs/prior Treatment) HPI    80 year old female with history of CML presents complaining of discoloration of her left foot, pain and swelling of her left foot, weakness, diaphoresis, nausea, dizziness. This initially started on Sunday, she was cleaning her shower and she thinks that she got sort of poison on her left foot. The next day the foot was blue and started to swell. At the same time, she started to have episodes of weakness that were associated with diaphoresis and nausea. She has episodes where she says she gets so weak that she cannot even stand and has to lay down. These happen frequently over the last few days she is unable to say exactly how often or how long they last. It is not associated with any chest pain. No treatment stridor home. She has never had this before. No history of MI or stroke. She denies any known injury to her foot  Past Medical History  Diagnosis Date  . Dyslipidemia   . GERD (gastroesophageal reflux disease)   . Dementia   . Anxiety   . Allergic rhinitis   . Hypertension   . DDD (degenerative disc disease)   . Osteoporosis   . Vitamin D deficiency   . Leukemia    Past Surgical History  Procedure Laterality Date  . Back surgery  1984   No family history on file. History  Substance Use Topics  . Smoking status: Never Smoker   . Smokeless tobacco: Not on file  . Alcohol Use: No   OB History   Grav Para Term Preterm Abortions TAB SAB Ect Mult Living                 Review of Systems  Constitutional: Positive for diaphoresis.  Respiratory: Negative for shortness of breath.   Cardiovascular: Negative for chest pain.  Gastrointestinal: Positive for nausea.  Musculoskeletal:          Left foot  pain and swelling  Neurological: Positive for weakness.  All other systems reviewed and are negative.   Allergies  Ace inhibitors; Hyzaar; Lipitor; Losartan; and Tiazac  Home Medications   Prior to Admission medications   Medication Sig Start Date End Date Taking? Authorizing Provider  aspirin 81 MG tablet Take 81 mg by mouth daily. 2-3 times weekly   Yes Historical Provider, MD  diltiazem (CARDIZEM CD) 240 MG 24 hr capsule Take 240 mg by mouth daily.   Yes Historical Provider, MD  ergocalciferol (VITAMIN D2) 50000 UNITS capsule Take 50,000 Units by mouth every 30 (thirty) days.   Yes Historical Provider, MD  fluticasone (FLONASE) 50 MCG/ACT nasal spray Place 2 sprays into the nose 2 (two) times daily. 06/19/13  Yes Collene Gobble, MD  furosemide (LASIX) 20 MG tablet Take 20 mg by mouth daily.   Yes Historical Provider, MD  imatinib (GLEEVEC) 400 MG tablet Take 1 tablet (400 mg total) by mouth daily. Take with meals and large glass of water.Caution:Chemotherapy. 03/19/14  Yes Curt Bears, MD  loratadine (CLARITIN) 10 MG tablet Take 1 tablet (10 mg total) by mouth daily. 06/19/13  Yes Collene Gobble, MD  omeprazole (PRILOSEC) 20 MG capsule 1 capsule daily. 06/13/13  Yes Historical Provider, MD  potassium chloride (K-DUR) 10 MEQ tablet Take 10  mEq by mouth daily.   Yes Historical Provider, MD  sertraline (ZOLOFT) 25 MG tablet 25 mg daily. 04/27/13  Yes Historical Provider, MD  valsartan (DIOVAN) 160 MG tablet 160 mg daily. 05/17/13  Yes Historical Provider, MD  ALPRAZolam Duanne Moron) 0.5 MG tablet Take 0.5 mg by mouth 2 (two) times daily as needed.    Historical Provider, MD  donepezil (ARICEPT) 5 MG tablet Take 5 mg by mouth at bedtime.    Historical Provider, MD  ENSURE (ENSURE) Take 1 Can by mouth 2 (two) times daily between meals. 10/01/13   Carlton Adam, PA-C  escitalopram (LEXAPRO) 5 MG tablet Take 5 mg by mouth daily.    Historical Provider, MD  pantoprazole (PROTONIX) 40 MG tablet Take 1  tablet (40 mg total) by mouth 2 (two) times daily. 06/19/13   Collene Gobble, MD  promethazine (PHENERGAN) 25 MG tablet Take 25 mg by mouth every 6 (six) hours as needed. q 4-6h prn    Historical Provider, MD  psyllium (METAMUCIL) 58.6 % powder Take 1 packet by mouth 3 (three) times daily.    Historical Provider, MD   BP 177/61  Pulse 64  Temp(Src) 97.7 F (36.5 C) (Oral)  Resp 20  SpO2 97% Physical Exam  Nursing note and vitals reviewed. Constitutional: She is oriented to person, place, and time. Vital signs are normal. She appears well-developed and well-nourished. No distress.  HENT:  Head: Normocephalic and atraumatic.  Cardiovascular: Normal rate, regular rhythm and normal heart sounds.   Pulses:      Dorsalis pedis pulses are 2+ on the right side, and 2+ on the left side.  No peripheral edema or calf tenderness  Pulmonary/Chest: Effort normal and breath sounds normal. No respiratory distress.  Neurological: She is alert and oriented to person, place, and time. She has normal strength. Coordination normal.  Skin: Skin is warm and dry. No rash noted. She is not diaphoretic.  Psychiatric: She has a normal mood and affect. Judgment normal.    ED Course  Procedures (including critical care time) Labs Review Labs Reviewed - No data to display  Imaging Review No results found.  EKG is very difficult to interpret patient would not sit still   MDM   1. Weakness   2. Nausea   3. CML (chronic myelocytic leukemia)    Patient symptoms are concerning for arrhythmia, she needs a full workup to rule out any complications of her CML, rule out that she has had a CVA, transferred to ED via [place], PA-C 06/12/14 [place] Jojuan [place], PA-C 06/12/14 1846

## 2014-06-12 NOTE — Telephone Encounter (Signed)
returned pt call and s.w pt r/s cx appt....pt ok adn aware

## 2014-06-12 NOTE — ED Notes (Signed)
C/o left foot pain onset Sunday Reports it maybe due to cleaning solution that fell on foot Reports swelling and pain  Also c/o weakness, SOB, and diaphoresis Alert, no signs of acute distress.

## 2014-06-12 NOTE — ED Provider Notes (Signed)
Medical screening examination/treatment/procedure(s) were performed by non-physician practitioner and as supervising physician I was immediately available for consultation/collaboration.  Philipp Deputy, M.D.  Harden Mo, MD 06/12/14 2152

## 2014-06-12 NOTE — ED Provider Notes (Signed)
CSN: [id number]     Arrival date & time 06/12/14  1908 History   First MD Initiated Contact with Patient 06/12/14 2207     Chief Complaint  Patient presents with  . Fever  . Foot Pain  . Weakness     (Consider location/radiation/quality/duration/timing/severity/associated sxs/prior Treatment) HPI Comments: Patient states, that she was cleaning a shower on Saturday with a chemical. she inadvertently got some on her left foot.  She felt a burning sensation in the area, and immediately washed it off  She's had no discomfort to the area since that time.  She also reports that 2 days later, she developed intermittent episodes of subjective fever.  Denies chills, dysuria, nausea, vomiting, diarrhea.  She, states she has a chronic cough.  That has not changed in its intensity or nature no chest pain, shortness of breath. He, states she's been eating well, having normal bowel movements  Patient is a 80 y.o. female presenting with fever, lower extremity pain, and weakness. The history is provided by the patient.  Fever Temp source:  Subjective Severity:  Mild Onset quality:  Gradual Duration:  3 days Timing:  Intermittent Progression:  Unchanged Chronicity:  New Relieved by:  Nothing Worsened by:  Nothing tried Ineffective treatments:  None tried Associated symptoms: cough   Associated symptoms: no chest pain, no chills, no congestion, no diarrhea, no dysuria, no headaches, no myalgias, no nausea, no rash, no rhinorrhea, no sore throat and no vomiting   Cough:    Cough characteristics:  Non-productive   Sputum characteristics:  Unable to specify   Severity:  Mild   Cough onset quality: Chronic.   Timing:  Intermittent   Progression:  Unchanged   Chronicity:  Chronic Risk factors: hx of cancer   Foot Pain Associated symptoms include coughing, a fever and weakness. Pertinent negatives include no abdominal pain, chest pain, chills, congestion, headaches, myalgias, nausea, rash, sore throat  or vomiting.  Weakness Associated symptoms include coughing, a fever and weakness. Pertinent negatives include no abdominal pain, chest pain, chills, congestion, headaches, myalgias, nausea, rash, sore throat or vomiting.    Past Medical History  Diagnosis Date  . Dyslipidemia   . GERD (gastroesophageal reflux disease)   . Dementia   . Anxiety   . Allergic rhinitis   . Hypertension   . DDD (degenerative disc disease)   . Osteoporosis   . Vitamin D deficiency   . Leukemia    Past Surgical History  Procedure Laterality Date  . Back surgery  1984   History reviewed. No pertinent family history. History  Substance Use Topics  . Smoking status: Never Smoker   . Smokeless tobacco: Not on file  . Alcohol Use: No   OB History   Grav Para Term Preterm Abortions TAB SAB Ect Mult Living                 Review of Systems  Constitutional: Positive for fever. Negative for chills.  HENT: Negative for congestion, rhinorrhea, sore throat and trouble swallowing.   Respiratory: Positive for cough. Negative for shortness of breath and wheezing.   Cardiovascular: Negative for chest pain.  Gastrointestinal: Negative for nausea, vomiting, abdominal pain and diarrhea.  Genitourinary: Negative for dysuria and frequency.  Musculoskeletal: Negative for myalgias.  Skin: Negative for rash and wound.  Neurological: Positive for weakness. Negative for headaches.  All other systems reviewed and are negative.     Allergies  Ace inhibitors; Hyzaar; Lipitor; Losartan; and Tiazac  Home Medications  Prior to Admission medications   Medication Sig Start Date End Date Taking? Authorizing Provider  ALPRAZolam Duanne Moron) 0.5 MG tablet Take 0.5 mg by mouth 2 (two) times daily as needed for anxiety.    Yes Historical Provider, MD  aspirin 81 MG tablet Take 81 mg by mouth daily. 2-3 times weekly   Yes Historical Provider, MD  diltiazem (CARDIZEM CD) 240 MG 24 hr capsule Take 240 mg by mouth daily.   Yes  Historical Provider, MD  donepezil (ARICEPT) 5 MG tablet Take 5 mg by mouth at bedtime.   Yes Historical Provider, MD  ergocalciferol (VITAMIN D2) 50000 UNITS capsule Take 50,000 Units by mouth every 30 (thirty) days.   Yes Historical Provider, MD  escitalopram (LEXAPRO) 5 MG tablet Take 5 mg by mouth daily.   Yes Historical Provider, MD  fluticasone (FLONASE) 50 MCG/ACT nasal spray Place 1 spray into both nostrils daily as needed for allergies or rhinitis.   Yes Historical Provider, MD  furosemide (LASIX) 20 MG tablet Take 20 mg by mouth daily.   Yes Historical Provider, MD  guaiFENesin-dextromethorphan (ROBITUSSIN DM) 100-10 MG/5ML syrup Take 5 mLs by mouth every 4 (four) hours as needed for cough.   Yes Historical Provider, MD  imatinib (GLEEVEC) 400 MG tablet Take 400 mg by mouth See admin instructions. Take with meals and large glass of water.Caution:Chemotherapy. Takes on Tues, thurs, sat, sun 03/19/14  Yes Curt Bears, MD  loratadine (CLARITIN) 10 MG tablet Take 1 tablet (10 mg total) by mouth daily. 06/19/13  Yes Collene Gobble, MD  omeprazole (PRILOSEC) 20 MG capsule Take 20 mg by mouth 2 (two) times daily before a meal.  06/13/13  Yes Historical Provider, MD  pantoprazole (PROTONIX) 40 MG tablet Take 1 tablet (40 mg total) by mouth 2 (two) times daily. 06/19/13  Yes Collene Gobble, MD  potassium chloride (K-DUR) 10 MEQ tablet Take 10 mEq by mouth daily.   Yes Historical Provider, MD  promethazine (PHENERGAN) 25 MG tablet Take 25 mg by mouth every 6 (six) hours as needed for nausea.    Yes Historical Provider, MD  psyllium (METAMUCIL) 58.6 % powder Take 1 packet by mouth 3 (three) times daily.   Yes Historical Provider, MD  sertraline (ZOLOFT) 25 MG tablet Take 25 mg by mouth daily.  04/27/13  Yes Historical Provider, MD  valsartan (DIOVAN) 160 MG tablet Take 160 mg by mouth daily.  05/17/13  Yes Historical Provider, MD   BP 150/63  Pulse 56  Temp(Src) 98.3 F (36.8 C) (Oral)  Resp 19   SpO2 100% Physical Exam  Nursing note and vitals reviewed. Constitutional: She is oriented to person, place, and time. She appears well-developed and well-nourished. No distress.  HENT:  Head: Normocephalic and atraumatic.  Right Ear: External ear normal.  Left Ear: External ear normal.  Mouth/Throat: Oropharynx is clear and moist.  Eyes: Pupils are equal, round, and reactive to light.  Neck: Normal range of motion.  Cardiovascular: Normal rate and regular rhythm.   Pulmonary/Chest: Effort normal and breath sounds normal.  Abdominal: Soft. She exhibits no distension.  Musculoskeletal: Normal range of motion. She exhibits no edema and no tenderness.       Feet:  Neurological: She is alert and oriented to person, place, and time. No cranial nerve deficit.  Skin: Skin is warm and dry. No rash noted. No erythema.    ED Course  Procedures (including critical care time) Labs Review Labs Reviewed  COMPREHENSIVE METABOLIC PANEL -  Abnormal; Notable for the following:    Glucose, Bld 107 (*)    Total Protein 8.4 (*)    AST 38 (*)    GFR calc non Af Amer 80 (*)    All other components within normal limits  CBC WITH DIFFERENTIAL  URINALYSIS, ROUTINE W REFLEX MICROSCOPIC    Imaging Review Dg Chest 2 View  06/12/2014   CLINICAL DATA:  Cough ; history of interstitial lung disease and chronic myelocytic leukemia  EXAM: CHEST  2 VIEW  COMPARISON:  PA and lateral chest of May 17, 2013  FINDINGS: The lungs are mildly hyperinflated. The interstitial markings are increased. Bilaterally but are stable. The heart is top-normal in size. The pulmonary vascularity is not engorged. There is mild tortuosity of the descending thoracic aorta. There is no pleural effusion or pneumothorax. There is gentle S-shaped thoracolumbar scoliosis which is stable.  IMPRESSION: There is no evidence of pneumonia nor CHF. There are chronic changes consistent with known interstitial lung disease.   Electronically Signed    By: David  Martinique   On: 06/12/2014 23:04   Dg Foot Complete Left  06/12/2014   CLINICAL DATA:  Left foot pain/swelling  EXAM: LEFT FOOT - COMPLETE 3+ VIEW  COMPARISON:  None.  FINDINGS: No fracture or dislocation is seen.  Moderate degenerative changes at the 1st MTP joint with hallux valgus deformity.  Posterior calcaneal enthesophyte.  The visualized soft tissues are unremarkable.  IMPRESSION: No fracture or dislocation is seen.   Electronically Signed   By: Julian Hy M.D.   On: 06/12/2014 19:48     EKG Interpretation None     Patient is in no distress MDM   Final diagnoses:  Myalgia  Foot contusion, left, initial encounter         Garald Balding, NP 06/12/14 2352

## 2014-06-13 ENCOUNTER — Ambulatory Visit: Payer: Medicare Other | Admitting: Internal Medicine

## 2014-06-13 ENCOUNTER — Other Ambulatory Visit: Payer: Medicare Other

## 2014-06-14 NOTE — ED Provider Notes (Signed)
Medical screening examination/treatment/procedure(s) were conducted as a shared visit with non-physician practitioner(s) and myself.  I personally evaluated the patient during the encounter.   EKG Interpretation None       No signs of foot injury, swelling or tenderness. Highest temp was 99, no fevers here and no obvious source of any infection. Return precautions, f/u with PCP  Ephraim Hamburger, MD 06/14/14 [phone]

## 2014-06-25 ENCOUNTER — Ambulatory Visit: Payer: Medicare Other | Admitting: Internal Medicine

## 2014-06-25 ENCOUNTER — Other Ambulatory Visit: Payer: Medicare Other

## 2014-06-26 ENCOUNTER — Other Ambulatory Visit: Payer: Self-pay | Admitting: Medical Oncology

## 2014-06-26 ENCOUNTER — Ambulatory Visit (HOSPITAL_BASED_OUTPATIENT_CLINIC_OR_DEPARTMENT_OTHER): Payer: Medicare Other | Admitting: Physician Assistant

## 2014-06-26 ENCOUNTER — Telehealth: Payer: Self-pay | Admitting: Physician Assistant

## 2014-06-26 ENCOUNTER — Other Ambulatory Visit (HOSPITAL_BASED_OUTPATIENT_CLINIC_OR_DEPARTMENT_OTHER): Payer: Medicare Other

## 2014-06-26 VITALS — BP 143/80 | HR 67 | Temp 98.4°F | Resp 20 | Ht 63.0 in | Wt 137.2 lb

## 2014-06-26 DIAGNOSIS — C9210 Chronic myeloid leukemia, BCR/ABL-positive, not having achieved remission: Secondary | ICD-10-CM

## 2014-06-26 DIAGNOSIS — C9290 Myeloid leukemia, unspecified, not having achieved remission: Secondary | ICD-10-CM

## 2014-06-26 LAB — CBC WITH DIFFERENTIAL/PLATELET
BASO%: 1 % (ref 0.0–2.0)
BASOS ABS: 0 10*3/uL (ref 0.0–0.1)
EOS ABS: 0 10*3/uL (ref 0.0–0.5)
EOS%: 1.2 % (ref 0.0–7.0)
HCT: 39.4 % (ref 34.8–46.6)
HEMOGLOBIN: 12.6 g/dL (ref 11.6–15.9)
LYMPH#: 1 10*3/uL (ref 0.9–3.3)
LYMPH%: 31.5 % (ref 14.0–49.7)
MCH: 29.8 pg (ref 25.1–34.0)
MCHC: 32 g/dL (ref 31.5–36.0)
MCV: 93.2 fL (ref 79.5–101.0)
MONO#: 0.3 10*3/uL (ref 0.1–0.9)
MONO%: 8.1 % (ref 0.0–14.0)
NEUT%: 58.2 % (ref 38.4–76.8)
NEUTROS ABS: 1.8 10*3/uL (ref 1.5–6.5)
Platelets: 238 10*3/uL (ref 145–400)
RBC: 4.23 10*6/uL (ref 3.70–5.45)
RDW: 14 % (ref 11.2–14.5)
WBC: 3.2 10*3/uL — ABNORMAL LOW (ref 3.9–10.3)

## 2014-06-26 LAB — COMPREHENSIVE METABOLIC PANEL (CC13)
ALBUMIN: 3.9 g/dL (ref 3.5–5.0)
ALK PHOS: 120 U/L (ref 40–150)
ALT: 17 U/L (ref 0–55)
AST: 24 U/L (ref 5–34)
Anion Gap: 8 mEq/L (ref 3–11)
BUN: 20.6 mg/dL (ref 7.0–26.0)
CALCIUM: 10.1 mg/dL (ref 8.4–10.4)
CHLORIDE: 107 meq/L (ref 98–109)
CO2: 24 mEq/L (ref 22–29)
Creatinine: 1.2 mg/dL — ABNORMAL HIGH (ref 0.6–1.1)
Glucose: 94 mg/dl (ref 70–140)
POTASSIUM: 3.9 meq/L (ref 3.5–5.1)
SODIUM: 139 meq/L (ref 136–145)
TOTAL PROTEIN: 7.4 g/dL (ref 6.4–8.3)
Total Bilirubin: 0.29 mg/dL (ref 0.20–1.20)

## 2014-06-26 LAB — LACTATE DEHYDROGENASE (CC13): LDH: 260 U/L — ABNORMAL HIGH (ref 125–245)

## 2014-06-26 MED ORDER — IMATINIB MESYLATE 400 MG PO TABS: 400.0000 mg | ORAL_TABLET | ORAL | Status: DC

## 2014-06-26 NOTE — Telephone Encounter (Signed)
Pt confirmed labs/ov per 10/14 POF, gave pt AVS.... KJ

## 2014-06-29 NOTE — Patient Instructions (Signed)
Continue taking Gleevec at your current dose Follow up in 3 months

## 2014-06-29 NOTE — Progress Notes (Addendum)
[place] Telephone:([phone]   Fax:([phone]) [employer], [address] [address]  PRINCIPAL DIAGNOSIS: Chronic myeloid leukemia diagnosed in 16-Mar-2009.   CURRENT THERAPY: Gleevec 400 mg p.o. for days a week on Saturday, Sunday, Wednesday and Friday secondary to significant neutropenia with the daily dose.  INTERVAL HISTORY: Pamela House 80 y.o. female returns to the clinic today for followup visit. She is feeling fine today with no specific complaints except for aching resolving left foot pain. She reports having an incident with perhaps a chemical burn on her feet after cleaning her shower with a new product. She was evaluated at an urgent care center and does not have any fractures.she initially had some bruising however this has resolved.  She is feeling fine otherwise with no specific complaints. She denied having any significant weight loss or night sweats. She denied having any fever or chills. She is tolerating her treatment with Gleevec fairly well. She had molecular studies for BCR/ABL performed in 03-16-14 that showed no detectable fusion protein.   ALLERGIES:  is allergic to ace inhibitors; hyzaar; lipitor; losartan; and tiazac.  MEDICATIONS:  Current Outpatient Prescriptions  Medication Sig Dispense Refill  . ALPRAZolam (XANAX) 0.5 MG tablet Take 0.5 mg by mouth 2 (two) times daily as needed for anxiety.       Marland Kitchen diltiazem (CARDIZEM CD) 240 MG 24 hr capsule Take 240 mg by mouth daily.      Marland Kitchen donepezil (ARICEPT) 5 MG tablet Take 5 mg by mouth at bedtime.      . ergocalciferol (VITAMIN D2) 50000 UNITS capsule Take 50,000 Units by mouth every 30 (thirty) days.      . fluticasone (FLONASE) 50 MCG/ACT nasal spray Place 1 spray into both nostrils daily as needed for allergies or rhinitis.      . furosemide (LASIX) 20 MG tablet Take 20 mg by mouth daily.      Marland Kitchen  guaiFENesin-dextromethorphan (ROBITUSSIN DM) 100-10 MG/5ML syrup Take 5 mLs by mouth every 4 (four) hours as needed for cough.      . imatinib (GLEEVEC) 400 MG tablet Take 1 tablet (400 mg total) by mouth See admin instructions. Take with meals and large glass of water.Caution:Chemotherapy. Takes on Tues, thurs, sat, sun  30 tablet  2  . omeprazole (PRILOSEC) 20 MG capsule Take 20 mg by mouth 2 (two) times daily before a meal.       . potassium chloride (K-DUR) 10 MEQ tablet Take 10 mEq by mouth daily.      . Probiotic Product (ALIGN PO) Take by mouth.      . psyllium (METAMUCIL) 58.6 % powder Take 1 packet by mouth 3 (three) times daily.      . traMADol (ULTRAM) 50 MG tablet Take by mouth every 6 (six) hours as needed.      . valsartan (DIOVAN) 160 MG tablet Take 160 mg by mouth daily.       Marland Kitchen aspirin 81 MG tablet Take 81 mg by mouth daily. 2-3 times weekly      . escitalopram (LEXAPRO) 5 MG tablet Take 5 mg by mouth daily.      Marland Kitchen loratadine (CLARITIN) 10 MG tablet Take 1 tablet (10 mg total) by mouth daily.  30 tablet  3  . pantoprazole (PROTONIX) 40 MG tablet Take 1 tablet (40 mg total) by mouth 2 (two) times daily.  30 tablet  3  . promethazine (PHENERGAN) 25 MG tablet Take 25 mg by mouth every 6 (six) hours as needed for nausea.       . sertraline (ZOLOFT) 25 MG tablet Take 25 mg by mouth daily.        No current facility-administered medications for this visit.    REVIEW OF SYSTEMS:  A comprehensive review of systems was negative except for: Constitutional: positive for fatigue Respiratory: positive for cough Genitourinary: positive for frequency and urinary incontinence   PHYSICAL EXAMINATION: General appearance: alert, cooperative and no distress Head: Normocephalic, without obvious abnormality, atraumatic Neck: no adenopathy Lymph nodes: Cervical, supraclavicular, and axillary nodes normal. Resp: clear to auscultation bilaterally Cardio: regular rate and rhythm, S1, S2 normal, no  murmur, click, rub or gallop GI: soft, non-tender; bowel sounds normal; no masses,  no organomegaly Extremities: extremities normal, atraumatic, no cyanosis or edema Neurologic: Alert and oriented X 3, normal strength and tone. Normal symmetric reflexes. Normal coordination and gait  ECOG PERFORMANCE STATUS: 2 - Symptomatic, <50% confined to bed  Blood pressure 143/80, pulse 67, temperature 98.4 F (36.9 C), temperature source Oral, resp. rate 20, height _0  (1.6 m), weight 137 lb 3.2 oz (62.234 kg).  LABORATORY DATA: Lab Results  Component Value Date   WBC 3.2* 06/26/2014   HGB 12.6 06/26/2014   HCT 39.4 06/26/2014   MCV 93.2 06/26/2014   PLT 238 06/26/2014      Chemistry      Component Value Date/Time   NA 139 06/26/2014 1344   NA 139 06/12/2014 2040   K 3.9 06/26/2014 1344   K 4.1 06/12/2014 2040   CL 101 06/12/2014 2040   CL 106 02/21/2013 1355   CO2 24 06/26/2014 1344   CO2 24 06/12/2014 2040   BUN 20.6 06/26/2014 1344   BUN 7 06/12/2014 2040   CREATININE 1.2* 06/26/2014 1344   CREATININE 0.66 06/12/2014 2040      Component Value Date/Time   CALCIUM 10.1 06/26/2014 1344   CALCIUM 9.7 06/12/2014 2040   ALKPHOS 120 06/26/2014 1344   ALKPHOS 106 06/12/2014 2040   AST 24 06/26/2014 1344   AST 38* 06/12/2014 2040   ALT 17 06/26/2014 1344   ALT 15 06/12/2014 2040   BILITOT 0.29 06/26/2014 1344   BILITOT 0.4 06/12/2014 2040       RADIOGRAPHIC STUDIES:  ASSESSMENT AND PLAN:  Chronic myeloid leukemia: Currently on treatment with Gleevec and tolerating it fairly well. We will continue treatment with the same dose. Her recent molecular study for BCR/ABL showed no detectable fusion protein. The patient was discussed with and also seen by Dr. Julien Nordmann. She'll continue on Gleevec at the current dose. A refill prescription was sent her pharmacy of record via E. scribe. She'll followup in 3 months for another symptom management visit with repeat CBC differential, C. met and LDH.    The patient was advised to call immediately if she has any concerning symptoms in the interval.   The patient voices understanding of current disease status and treatment options and is in agreement with the current care plan.  All questions were answered. The patient knows to call the clinic with any problems, questions or concerns. We can certainly see the patient much sooner if necessary.  Carlton Adam PA-C  ADDENDUM: Hematology/Oncology Attending: I had a face to face encounter with the patient. I recommended her care plan. This is a very pleasant 80 years old [place] American female diagnosed with chronic  myeloid leukemia in June of 2010 and has been on treatment with Gleevec 4 days a week for several years and tolerating her treatment fairly well. Her morphologic studies as well as the molecular studies showed no evidence for residual disease. The patient is feeling fine today with no specific complaints except for generalized fatigue. I recommended for her to continue her current treatment with Gleevec. We will see her back for followup visit in 3 months for reevaluation and repeat CBC, comprehensive metabolic panel and LDH. She was advised to call immediately if she has any concerning symptoms in the interval.   Disclaimer: This note was dictated with voice recognition software. Similar sounding words can inadvertently be transcribed and may not be corrected upon review. Eilleen Kempf., MD 06/30/2014

## 2014-07-15 ENCOUNTER — Other Ambulatory Visit: Payer: Self-pay | Admitting: *Deleted

## 2014-07-15 DIAGNOSIS — C9210 Chronic myeloid leukemia, BCR/ABL-positive, not having achieved remission: Secondary | ICD-10-CM

## 2014-07-15 MED ORDER — IMATINIB MESYLATE 400 MG PO TABS: 400.0000 mg | ORAL_TABLET | ORAL | Status: DC

## 2014-09-10 ENCOUNTER — Telehealth: Payer: Self-pay | Admitting: Internal Medicine

## 2014-09-10 NOTE — Telephone Encounter (Signed)
due to call day 1/13 appt moved to AM. s/w pt and she cannot do AM appt. pt given new appt for 2/1 (next available PM).

## 2014-09-13 DIAGNOSIS — Z9221 Personal history of antineoplastic chemotherapy: Secondary | ICD-10-CM

## 2014-09-13 DIAGNOSIS — C50919 Malignant neoplasm of unspecified site of unspecified female breast: Secondary | ICD-10-CM

## 2014-09-13 DIAGNOSIS — Z923 Personal history of irradiation: Secondary | ICD-10-CM

## 2014-09-13 HISTORY — PX: BREAST LUMPECTOMY: SHX2

## 2014-09-13 HISTORY — DX: Personal history of irradiation: Z92.3

## 2014-09-13 HISTORY — DX: Malignant neoplasm of unspecified site of unspecified female breast: C50.919

## 2014-09-13 HISTORY — DX: Personal history of antineoplastic chemotherapy: Z92.21

## 2014-09-25 ENCOUNTER — Other Ambulatory Visit: Payer: Medicare Other

## 2014-09-25 ENCOUNTER — Ambulatory Visit: Payer: Medicare Other | Admitting: Internal Medicine

## 2014-10-14 ENCOUNTER — Other Ambulatory Visit: Payer: Medicare Other

## 2014-10-14 ENCOUNTER — Ambulatory Visit: Payer: Medicare Other | Admitting: Internal Medicine

## 2014-10-14 ENCOUNTER — Telehealth: Payer: Self-pay

## 2014-10-14 NOTE — Telephone Encounter (Signed)
Pt called in to triage - needs to reschedule appt today with [facility].  Inbasket sent to [facility].

## 2014-10-15 ENCOUNTER — Telehealth: Payer: Self-pay | Admitting: Internal Medicine

## 2014-10-15 NOTE — Telephone Encounter (Signed)
s.w. pt and advised on March appt...ok and aware

## 2014-11-06 ENCOUNTER — Telehealth: Payer: Self-pay | Admitting: Internal Medicine

## 2014-11-06 NOTE — Telephone Encounter (Signed)
pt called to confirm appt....pt ok adn aware of d.t

## 2014-11-12 ENCOUNTER — Ambulatory Visit: Payer: Self-pay | Admitting: Internal Medicine

## 2014-11-12 ENCOUNTER — Other Ambulatory Visit: Payer: Self-pay

## 2014-12-09 ENCOUNTER — Telehealth: Payer: Self-pay | Admitting: Internal Medicine

## 2014-12-09 NOTE — Telephone Encounter (Signed)
Pt called to r/s labs/ov due to she was sick, pt confirmed updated schedule... KJ

## 2014-12-16 ENCOUNTER — Telehealth: Payer: Self-pay | Admitting: Physician Assistant

## 2014-12-16 ENCOUNTER — Encounter: Payer: Self-pay | Admitting: Physician Assistant

## 2014-12-16 ENCOUNTER — Other Ambulatory Visit: Payer: Medicare Other

## 2014-12-16 ENCOUNTER — Ambulatory Visit (HOSPITAL_BASED_OUTPATIENT_CLINIC_OR_DEPARTMENT_OTHER): Payer: Medicare Other | Admitting: Physician Assistant

## 2014-12-16 ENCOUNTER — Other Ambulatory Visit (HOSPITAL_BASED_OUTPATIENT_CLINIC_OR_DEPARTMENT_OTHER): Payer: Medicare Other

## 2014-12-16 VITALS — BP 158/58 | HR 66 | Temp 98.5°F | Resp 18 | Ht 63.0 in | Wt 137.1 lb

## 2014-12-16 DIAGNOSIS — C9210 Chronic myeloid leukemia, BCR/ABL-positive, not having achieved remission: Secondary | ICD-10-CM

## 2014-12-16 DIAGNOSIS — R32 Unspecified urinary incontinence: Secondary | ICD-10-CM | POA: Diagnosis not present

## 2014-12-16 LAB — CBC WITH DIFFERENTIAL/PLATELET
BASO%: 0.3 % (ref 0.0–2.0)
Basophils Absolute: 0 10*3/uL (ref 0.0–0.1)
EOS%: 2.7 % (ref 0.0–7.0)
Eosinophils Absolute: 0.1 10*3/uL (ref 0.0–0.5)
HCT: 35.7 % (ref 34.8–46.6)
HEMOGLOBIN: 11.9 g/dL (ref 11.6–15.9)
LYMPH%: 48.4 % (ref 14.0–49.7)
MCH: 30.3 pg (ref 25.1–34.0)
MCHC: 33.3 g/dL (ref 31.5–36.0)
MCV: 90.8 fL (ref 79.5–101.0)
MONO#: 0.2 10*3/uL (ref 0.1–0.9)
MONO%: 5.3 % (ref 0.0–14.0)
NEUT#: 1.6 10*3/uL (ref 1.5–6.5)
NEUT%: 43.3 % (ref 38.4–76.8)
PLATELETS: 208 10*3/uL (ref 145–400)
RBC: 3.93 10*6/uL (ref 3.70–5.45)
RDW: 14.1 % (ref 11.2–14.5)
WBC: 3.7 10*3/uL — AB (ref 3.9–10.3)
lymph#: 1.8 10*3/uL (ref 0.9–3.3)

## 2014-12-16 LAB — COMPREHENSIVE METABOLIC PANEL (CC13)
ALT: 18 U/L (ref 0–55)
AST: 24 U/L (ref 5–34)
Albumin: 3.9 g/dL (ref 3.5–5.0)
Alkaline Phosphatase: 115 U/L (ref 40–150)
Anion Gap: 10 mEq/L (ref 3–11)
BUN: 16.9 mg/dL (ref 7.0–26.0)
CALCIUM: 10 mg/dL (ref 8.4–10.4)
CHLORIDE: 106 meq/L (ref 98–109)
CO2: 26 mEq/L (ref 22–29)
Creatinine: 0.9 mg/dL (ref 0.6–1.1)
EGFR: 73 mL/min/{1.73_m2} — ABNORMAL LOW (ref >90)
Glucose: 79 mg/dl (ref 70–140)
Potassium: 4 mEq/L (ref 3.5–5.1)
Sodium: 142 mEq/L (ref 136–145)
Total Bilirubin: <0.2 mg/dL (ref 0.20–1.20)
Total Protein: 6.9 g/dL (ref 6.4–8.3)

## 2014-12-16 LAB — LACTATE DEHYDROGENASE (CC13): LDH: 240 U/L (ref 125–245)

## 2014-12-16 NOTE — Telephone Encounter (Signed)
Gave avs & calendar for July

## 2014-12-16 NOTE — Progress Notes (Addendum)
[facility] Telephone:([phone]) [phone]   Fax:([phone]) [place] [address] [address]  PRINCIPAL DIAGNOSIS: Chronic myeloid leukemia diagnosed in June 2010.   CURRENT THERAPY: Gleevec 400 mg p.o. for days a week on Saturday, Sunday, Wednesday and Friday secondary to significant neutropenia with the daily dose.  INTERVAL HISTORY: Pamela House 81 y.o. female returns to the clinic today for followup visit. She missed an appointment earlier this year. She continues to have episodes of productive cough with mucous as well as episodes of diarrhea. She gets hot and short of breath with these episodes. She will need a refill of her Gleevec. Her last BCR/ABL was performed in 09/22/2013.   She is feeling fine otherwise with no specific complaints. She denied having any significant weight loss or night sweats. She denied having any fever or chills. She is tolerating her treatment with Gleevec fairly well. She had molecular studies for BCR/ABL performed in 22-Sep-2013 that showed no detectable fusion protein. She continues to have issues with urinary incontinence and has an upcoming appointment with urology to further evaluate this issue.  ALLERGIES:  is allergic to ace inhibitors; hyzaar; lipitor; losartan; and tiazac.  MEDICATIONS:  Current Outpatient Prescriptions  Medication Sig Dispense Refill  . ALPRAZolam (XANAX) 0.5 MG tablet Take 0.5 mg by mouth 2 (two) times daily as needed for anxiety.     Marland Kitchen aspirin 81 MG tablet Take 81 mg by mouth daily. 2-3 times weekly    . diltiazem (CARDIZEM CD) 240 MG 24 hr capsule Take 240 mg by mouth daily.    Marland Kitchen donepezil (ARICEPT) 5 MG tablet Take 5 mg by mouth at bedtime.    . ergocalciferol (VITAMIN D2) 50000 UNITS capsule Take 50,000 Units by mouth every 30 (thirty) days.    Marland Kitchen escitalopram (LEXAPRO) 5 MG tablet Take 5 mg by mouth daily.    . fluticasone (FLONASE) 50  MCG/ACT nasal spray Place 1 spray into both nostrils daily as needed for allergies or rhinitis.    . furosemide (LASIX) 20 MG tablet Take 20 mg by mouth daily.    Marland Kitchen guaiFENesin-dextromethorphan (ROBITUSSIN DM) 100-10 MG/5ML syrup Take 5 mLs by mouth every 4 (four) hours as needed for cough.    . imatinib (GLEEVEC) 400 MG tablet Take 1 tablet (400 mg total) by mouth See admin instructions. Take with meals and large glass of water.Caution:Chemotherapy. Takes on Tues, thurs, sat, sun 30 tablet 2  . loratadine (CLARITIN) 10 MG tablet Take 1 tablet (10 mg total) by mouth daily. 30 tablet 3  . omeprazole (PRILOSEC) 20 MG capsule Take 20 mg by mouth 2 (two) times daily before a meal.     . pantoprazole (PROTONIX) 40 MG tablet Take 1 tablet (40 mg total) by mouth 2 (two) times daily. 30 tablet 3  . potassium chloride (K-DUR) 10 MEQ tablet Take 10 mEq by mouth daily.    . Probiotic Product (ALIGN PO) Take by mouth.    . promethazine (PHENERGAN) 25 MG tablet Take 25 mg by mouth every 6 (six) hours as needed for nausea.     . psyllium (METAMUCIL) 58.6 % powder Take 1 packet by mouth 3 (three) times daily.    . sertraline (ZOLOFT) 25 MG tablet Take 25 mg by mouth daily.     . traMADol (ULTRAM) 50 MG tablet Take by mouth every 6 (six) hours as needed.    Marland Kitchen  valsartan (DIOVAN) 160 MG tablet Take 160 mg by mouth daily.      No current facility-administered medications for this visit.    REVIEW OF SYSTEMS:  A comprehensive review of systems was negative except for: Constitutional: positive for fatigue Respiratory: positive for cough and Productive of mucus Gastrointestinal: positive for diarrhea Genitourinary: positive for frequency and urinary incontinence   PHYSICAL EXAMINATION: General appearance: alert, cooperative and no distress Head: Normocephalic, without obvious abnormality, atraumatic Neck: no adenopathy Lymph nodes: Cervical, supraclavicular, and axillary nodes normal. Resp: clear to  auscultation bilaterally Cardio: regular rate and rhythm, S1, S2 normal, no murmur, click, rub or gallop GI: soft, non-tender; bowel sounds normal; no masses,  no organomegaly Extremities: extremities normal, atraumatic, no cyanosis or edema Neurologic: Alert and oriented X 3, normal strength and tone. Normal symmetric reflexes. Normal coordination and gait  ECOG PERFORMANCE STATUS: 1 - Symptomatic but completely ambulatory  Blood pressure 158/58, pulse 66, temperature 98.5 F (36.9 C), temperature source Oral, resp. rate 18, height _0  (1.6 m), weight 137 lb 1.6 oz (62.188 kg), SpO2 99 %.  LABORATORY DATA: Lab Results  Component Value Date   WBC 3.7* 12/16/2014   HGB 11.9 12/16/2014   HCT 35.7 12/16/2014   MCV 90.8 12/16/2014   PLT 208 12/16/2014      Chemistry      Component Value Date/Time   NA 142 12/16/2014 1535   NA 139 06/12/2014 2040   K 4.0 12/16/2014 1535   K 4.1 06/12/2014 2040   CL 101 06/12/2014 2040   CL 106 02/21/2013 1355   CO2 26 12/16/2014 1535   CO2 24 06/12/2014 2040   BUN 16.9 12/16/2014 1535   BUN 7 06/12/2014 2040   CREATININE 0.9 12/16/2014 1535   CREATININE 0.66 06/12/2014 2040      Component Value Date/Time   CALCIUM 10.0 12/16/2014 1535   CALCIUM 9.7 06/12/2014 2040   ALKPHOS 115 12/16/2014 1535   ALKPHOS 106 06/12/2014 2040   AST 24 12/16/2014 1535   AST 38* 06/12/2014 2040   ALT 18 12/16/2014 1535   ALT 15 06/12/2014 2040   BILITOT <0.20 12/16/2014 1535   BILITOT 0.4 06/12/2014 2040       RADIOGRAPHIC STUDIES:  ASSESSMENT AND PLAN:  Chronic myeloid leukemia: Currently on treatment with Gleevec and tolerating it fairly well. We will continue treatment with the same dose for now until we get the results of her BCR/ABL. We have ordered the BCR/ABL test drawn today. We have asked her to call us in the next one to 2 weeks for the results and for instructions regarding her Gleevec. As long she has no detectable fusion protein she will  remain on the same dose of Gleevec if we need to make adjustments to her dose we will do it based on the results of the BCR/ABL. We'll plan to see her again in 3 months with repeat CBC differential, C met and LDH. The patient was discussed with and also seen by Dr. Julien Nordmann.   The patient was advised to call immediately if she has any concerning symptoms in the interval.   The patient voices understanding of current disease status and treatment options and is in agreement with the current care plan.  All questions were answered. The patient knows to call the clinic with any problems, questions or concerns. We can certainly see the patient much sooner if necessary.  Carlton Adam, PA-C 12/16/2014  ADDENDUM: Hematology/Oncology Attending:  I had a face  to face encounter with the patient. I recommended her care plan. This is a very pleasant 82 years old African-American female with history of chronic myeloid leukemia diagnosed in June 2010 and has been on treatment with Gleevec 400 mg by mouth 4 days a week as the patient was unable to tolerated the daily doses. She is tolerating her current treatment regimen fairly well with no significant adverse effects. She missed her appointment in January 2016. I recommended for the patient to continue her current treatment with [place] and I will order repeat molecular study for BCR/ABL before her upcoming visit in 3 months. She was advised to call immediately if she has any concerning symptoms in the interval.  Disclaimer: This note was dictated with voice recognition software. Similar sounding words can inadvertently be transcribed and may be missed upon review. Eilleen Kempf., MD 12/22/2014

## 2014-12-19 NOTE — Patient Instructions (Signed)
Continue your current Gleevec dose for now. Call our office in the next week to 10 days for the results of your BCR/ABL test to see if you are to remain on your current dose of Gleevec or if we are going to make changes. At that time we will also refill your prescription for Gleevec. Follow-up in 3 months

## 2014-12-20 ENCOUNTER — Telehealth: Payer: Self-pay

## 2014-12-20 NOTE — Telephone Encounter (Signed)
Pt called stating she has 4 gleevec left. She takes 4/ week so has she enough to next Thursday 12/26/14. S/w adrena and we are waiting for BRC/ABL lab to come back to determine dosage on gleevec. Called pt back and asked her to call again on Mon or Tues to give [place] time to get lab results. She gets this filled at [facility] [address]. They do fill gleevec prescriptions.

## 2014-12-24 ENCOUNTER — Telehealth: Payer: Self-pay | Admitting: *Deleted

## 2014-12-24 NOTE — Telephone Encounter (Signed)
PT. HAS ENOUGH GLEEVEC FOR 12/26/14. THIS NOTE ROUTED TO [facility]

## 2014-12-25 ENCOUNTER — Ambulatory Visit
Admission: RE | Admit: 2014-12-25 | Discharge: 2014-12-25 | Disposition: A | Discharge status: Home / Self Care | Payer: Medicare Other | Source: Ambulatory Visit | Attending: Internal Medicine | Admitting: Internal Medicine

## 2014-12-25 DIAGNOSIS — Z1231 Encounter for screening mammogram for malignant neoplasm of breast: Secondary | ICD-10-CM

## 2014-12-26 ENCOUNTER — Other Ambulatory Visit: Payer: Self-pay | Admitting: Physician Assistant

## 2014-12-26 ENCOUNTER — Other Ambulatory Visit: Payer: Self-pay | Admitting: *Deleted

## 2014-12-26 DIAGNOSIS — C9210 Chronic myeloid leukemia, BCR/ABL-positive, not having achieved remission: Secondary | ICD-10-CM

## 2014-12-26 MED ORDER — IMATINIB MESYLATE 400 MG PO TABS: 400.0000 mg | ORAL_TABLET | ORAL | Status: DC

## 2014-12-26 NOTE — Telephone Encounter (Signed)
BRC/ABL LAB RESULTS HAVE NOT BEEN COMPLETED. VERBAL ORDER AND READ BACK TO Pamela JOHNSON,PA- CALL TO PHARMACY A MONTH SUPPLY OF GLEEVEC. HAVE PT. CALL THE OFFICE WHEN THERE IS A TWO WEEK SUPPLY LEFT OF THIS MONTH SUPPLY. NOTIFIED PT. SHE VOICES UNDERSTANDING.

## 2014-12-27 ENCOUNTER — Other Ambulatory Visit: Payer: Self-pay | Admitting: Internal Medicine

## 2014-12-27 DIAGNOSIS — R928 Other abnormal and inconclusive findings on diagnostic imaging of breast: Secondary | ICD-10-CM

## 2015-01-02 ENCOUNTER — Ambulatory Visit
Admission: RE | Admit: 2015-01-02 | Discharge: 2015-01-02 | Disposition: A | Discharge status: Home / Self Care | Payer: Medicare Other | Source: Ambulatory Visit | Attending: Internal Medicine | Admitting: Internal Medicine

## 2015-01-02 ENCOUNTER — Other Ambulatory Visit: Payer: Self-pay | Admitting: Internal Medicine

## 2015-01-02 ENCOUNTER — Other Ambulatory Visit: Payer: Medicare Other

## 2015-01-02 DIAGNOSIS — R928 Other abnormal and inconclusive findings on diagnostic imaging of breast: Secondary | ICD-10-CM

## 2015-01-02 DIAGNOSIS — N6320 Unspecified lump in the left breast, unspecified quadrant: Secondary | ICD-10-CM

## 2015-01-02 LAB — BCR/ABL (LIO MMD)

## 2015-01-09 ENCOUNTER — Other Ambulatory Visit: Payer: Self-pay | Admitting: Internal Medicine

## 2015-01-09 DIAGNOSIS — N6320 Unspecified lump in the left breast, unspecified quadrant: Secondary | ICD-10-CM

## 2015-01-17 ENCOUNTER — Other Ambulatory Visit: Payer: Medicare Other

## 2015-01-23 ENCOUNTER — Ambulatory Visit
Admission: RE | Admit: 2015-01-23 | Discharge: 2015-01-23 | Disposition: A | Discharge status: Home / Self Care | Payer: Medicare Other | Source: Ambulatory Visit | Attending: Internal Medicine | Admitting: Internal Medicine

## 2015-01-23 ENCOUNTER — Encounter (INDEPENDENT_AMBULATORY_CARE_PROVIDER_SITE_OTHER): Payer: Self-pay

## 2015-01-23 DIAGNOSIS — N6320 Unspecified lump in the left breast, unspecified quadrant: Secondary | ICD-10-CM

## 2015-02-03 ENCOUNTER — Telehealth: Payer: Self-pay | Admitting: *Deleted

## 2015-02-03 ENCOUNTER — Other Ambulatory Visit: Payer: Self-pay | Admitting: Oncology

## 2015-02-03 NOTE — Telephone Encounter (Signed)
Received request from [facility] that Dr. Julien Nordmann would like for a breast MD to see pt due to her new diagnosis.  Received appt from Dr. Jana Hakim.  Called pt and confirmed 02/07/15 appt w/ her.  Emailed Anderson Malta & Dr. Marlou Starks at [facility] to make them aware & request for office note to be faxed.  Emailed Dr. Julien Nordmann to make him aware as well.

## 2015-02-07 ENCOUNTER — Ambulatory Visit (HOSPITAL_BASED_OUTPATIENT_CLINIC_OR_DEPARTMENT_OTHER): Payer: Medicare Other | Admitting: Oncology

## 2015-02-07 ENCOUNTER — Telehealth: Payer: Self-pay | Admitting: Oncology

## 2015-02-07 ENCOUNTER — Other Ambulatory Visit: Payer: Self-pay | Admitting: *Deleted

## 2015-02-07 VITALS — BP 140/52 | HR 52 | Temp 98.1°F | Resp 18 | Ht 63.0 in | Wt 137.2 lb

## 2015-02-07 DIAGNOSIS — C9210 Chronic myeloid leukemia, BCR/ABL-positive, not having achieved remission: Secondary | ICD-10-CM | POA: Diagnosis not present

## 2015-02-07 DIAGNOSIS — Z171 Estrogen receptor negative status [ER-]: Secondary | ICD-10-CM

## 2015-02-07 DIAGNOSIS — C50919 Malignant neoplasm of unspecified site of unspecified female breast: Secondary | ICD-10-CM

## 2015-02-07 DIAGNOSIS — C50412 Malignant neoplasm of upper-outer quadrant of left female breast: Secondary | ICD-10-CM | POA: Insufficient documentation

## 2015-02-07 DIAGNOSIS — C50912 Malignant neoplasm of unspecified site of left female breast: Secondary | ICD-10-CM | POA: Diagnosis not present

## 2015-02-07 NOTE — Progress Notes (Signed)
[place]  [phone] [phone]     ID: Pamela House DOB: Oct 13, 1929  MR#: [id number]  PYK#:[id number]  Patient Care Team: Pamela Low, MD as PCP - General (Internal Medicine) Pamela Cruel, MD as Consulting Physician (Oncology) Pamela Messing III, MD as Consulting Physician (General Surgery) PCP: Pamela Low, MD GYN: OTHER MD:  CHIEF COMPLAINT: HER-2 positive early stage breast cancer  CURRENT TREATMENT: Awaiting definitive surgery   BREAST CANCER HISTORY: Pamela House has a history of chronic myeloid leukemia followed by Dr. Earlie House. She is treated with imatinib and her most recent BCR.ABL/ABL ratio was nearly unmeasurable.   On 12/25/2014 the patient had routine bilateral screening mammography at the breast Center, showing a breast density category B. There was a possible mass in the left breast and the patient was recalled for left diagnostic mammography with tomosynthesis and left breast ultrasonography 01/02/2015. This showed an 8 mm mass with indistinct margins in the posterior third of the outer left breast, which was palpable at the 2:30 position 8 cm from the nipple. Ultrasound confirmed an oval solid mass measuring 8 mm. Ultrasound of the left axilla showed no abnormal lymphadenopathy.  Biopsy of the left breast mass in question 01/23/2015 showed ([id number]) an invasive ductal carcinoma, grade 3, estrogen and progesterone receptor negative, but with HER-2 amplification, the signals ratio being 2.38, and the number per cell 3.45. MIB-1 was 79%.  The patient's case was presented at the multidisciplinary breast cancer conference 01/29/2015. At that time it was felt that breast conserving surgery would be the first step, to be followed by adjuvant chemotherapy and anti-HER-2 immunotherapy.  The patient's subsequent history is as detailed below  INTERVAL HISTORY: Pamela House was evaluated in the breast cancer clinic 02/07/2015,  accompanied by her daughter Pamela House and her granddaughter Pamela House.  REVIEW OF SYSTEMS: There were no specific symptoms leading to the original mammogram, which was routinely scheduled. The patient complains of problems with fever, cramping, needing new glasses, hearing loss, sinus symptoms with a runny nose and a dry cough, problems with her dentures, ankle swelling, poor appetite, significant diarrhea, stress urinary incontinence, urinary urgency and dysuria, back pain and arthritis which is however not more intense or persistent than before, rare headaches, occasional feelings of weakness, and forgetfulness, with anxiety but not depression. A detailed review of systems today was otherwise noncontributory  PAST MEDICAL HISTORY: Past Medical History  Diagnosis Date  . Dyslipidemia   . GERD (gastroesophageal reflux disease)   . Dementia   . Anxiety   . Allergic rhinitis   . Hypertension   . DDD (degenerative disc disease)   . Osteoporosis   . Vitamin D deficiency   . Leukemia     PAST SURGICAL HISTORY: Past Surgical History  Procedure Laterality Date  . Back surgery  1984    FAMILY HISTORY No family history on file. The patient's father died at the age of 70. The patient's mother died in her 34L from complications of diabetes. The patient had one full brother and one full sister there this fall sister had ovarian cancer diagnosed late in life. The patient had 14 additional half siblings. There is no other history of breast cancer in the family.  GYNECOLOGIC HISTORY:  No LMP recorded. Patient has had a hysterectomy. Menarche age 32, first live birth age 42. She is GX P5. [name] is not sure when she went through the change of life, but she had a hysterectomy remotely. She is not sure  whether the ovaries were removed. She did not take hormone replacement.  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 Her husband died in Nov 03, 1999 and  she lives by herself, with no pets, in the [place] school apartments off some. There are exercise groups there but not a gym. Her daughter Pamela House is Agricultural consultant for a shipping company in [place]. Granddaughter Pamela House (pronounced "Trey Paula") is a Education officer, museum for the child protective services in [place]. The patient has 2 sons who have died. She attends a [employer].    ADVANCED DIRECTIVES: Her daughter Pamela House is her healthcare power of attorney. She can be reached at [phone].   HEALTH MAINTENANCE: History  Substance Use Topics  . Smoking status: Never Smoker   . Smokeless tobacco: Not on file  . Alcohol Use: No     Colonoscopy:  PAP:  Bone density:  Lipid panel:  Allergies  Allergen Reactions  . Ace Inhibitors Cough  . Hyzaar [Losartan Potassium-Hctz] Cough  . Lipitor [Atorvastatin]     Muscle fatigue  . Losartan Cough  . Tiazac [Diltiazem Hcl Er Beads] Cough    Current Outpatient Prescriptions  Medication Sig Dispense Refill  . ALPRAZolam (XANAX) 0.5 MG tablet Take 0.5 mg by mouth 2 (two) times daily as needed for anxiety.     Marland Kitchen aspirin 81 MG tablet Take 81 mg by mouth daily. 2-3 times weekly    . diltiazem (CARDIZEM CD) 240 MG 24 hr capsule Take 240 mg by mouth daily.    . diphenoxylate-atropine (LOMOTIL) 2.5-0.025 MG per tablet   0  . donepezil (ARICEPT) 5 MG tablet Take 5 mg by mouth at bedtime.    . ergocalciferol (VITAMIN D2) 50000 UNITS capsule Take 50,000 Units by mouth every 30 (thirty) days.    . fluticasone (FLONASE) 50 MCG/ACT nasal spray Place 1 spray into both nostrils daily as needed for allergies or rhinitis.    . furosemide (LASIX) 20 MG tablet Take 20 mg by mouth daily.    Marland Kitchen imatinib (GLEEVEC) 400 MG tablet Take 1 tablet (400 mg total) by mouth See admin instructions. Take with meals and large glass of water.Caution:Chemotherapy. Takes on Tues, thurs, sat, sun 30 tablet 0  . omeprazole (PRILOSEC) 20 MG capsule Take 20  mg by mouth 2 (two) times daily before a meal.     . potassium chloride (K-DUR) 10 MEQ tablet Take 10 mEq by mouth daily.    . valsartan (DIOVAN) 160 MG tablet Take 160 mg by mouth daily.     Marland Kitchen guaiFENesin-dextromethorphan (ROBITUSSIN DM) 100-10 MG/5ML syrup Take 5 mLs by mouth every 4 (four) hours as needed for cough.    . loratadine (CLARITIN) 10 MG tablet Take 1 tablet (10 mg total) by mouth daily. (Patient not taking: Reported on 02/07/2015) 30 tablet 3  . Probiotic Product (ALIGN PO) Take by mouth.    . promethazine (PHENERGAN) 25 MG tablet Take 25 mg by mouth every 6 (six) hours as needed for nausea.     . psyllium (METAMUCIL) 58.6 % powder Take 1 packet by mouth 3 (three) times daily.     No current facility-administered medications for this visit.    OBJECTIVE: Older white woman who appears stated age 81 Vitals:   02/07/15 1524  BP: 140/52  Pulse: 52  Temp: 98.1 F (36.7 C)  Resp: 18     Body mass index is 24.31 kg/(m^2).  ECOG FS:1 - Symptomatic but completely ambulatory  Ocular: Sclerae unicteric, bilateral arcus senilis Ear-nose-throat: Oropharynx clear and moist Lymphatic: No cervical or supraclavicular adenopathy Lungs no rales or rhonchi, fair excursion bilaterally Heart regular rate and rhythm, no murmur appreciated Abd soft, nontender, positive bowel sounds MSK no focal spinal tenderness, no left upper extremity lymphedema Neuro: non-focal, well-oriented, appropriate affect Breasts: The right breast is unremarkable. I cannot palpate a mass in the left breast. There are no skin or nipple changes of concern. The left axilla is benign.   LAB RESULTS:  CMP     Component Value Date/Time   NA 142 12/16/2014 1535   NA 139 06/12/2014 2040   K 4.0 12/16/2014 1535   K 4.1 06/12/2014 2040   CL 101 06/12/2014 2040   CL 106 02/21/2013 1355   CO2 26 12/16/2014 1535   CO2 24 06/12/2014 2040   GLUCOSE 79 12/16/2014 1535   GLUCOSE 107* 06/12/2014 2040   GLUCOSE 92  02/21/2013 1355   BUN 16.9 12/16/2014 1535   BUN 7 06/12/2014 2040   CREATININE 0.9 12/16/2014 1535   CREATININE 0.66 06/12/2014 2040   CALCIUM 10.0 12/16/2014 1535   CALCIUM 9.7 06/12/2014 2040   PROT 6.9 12/16/2014 1535   PROT 8.4* 06/12/2014 2040   ALBUMIN 3.9 12/16/2014 1535   ALBUMIN 4.3 06/12/2014 2040   AST 24 12/16/2014 1535   AST 38* 06/12/2014 2040   ALT 18 12/16/2014 1535   ALT 15 06/12/2014 2040   ALKPHOS 115 12/16/2014 1535   ALKPHOS 106 06/12/2014 2040   BILITOT <0.20 12/16/2014 1535   BILITOT 0.4 06/12/2014 2040   GFRNONAA 80* 06/12/2014 2040   GFRAA >90 06/12/2014 2040    INo results found for: SPEP, UPEP  Lab Results  Component Value Date   WBC 3.7* 12/16/2014   NEUTROABS 1.6 12/16/2014   HGB 11.9 12/16/2014   HCT 35.7 12/16/2014   MCV 90.8 12/16/2014   PLT 208 12/16/2014      Chemistry      Component Value Date/Time   NA 142 12/16/2014 1535   NA 139 06/12/2014 2040   K 4.0 12/16/2014 1535   K 4.1 06/12/2014 2040   CL 101 06/12/2014 2040   CL 106 02/21/2013 1355   CO2 26 12/16/2014 1535   CO2 24 06/12/2014 2040   BUN 16.9 12/16/2014 1535   BUN 7 06/12/2014 2040   CREATININE 0.9 12/16/2014 1535   CREATININE 0.66 06/12/2014 2040      Component Value Date/Time   CALCIUM 10.0 12/16/2014 1535   CALCIUM 9.7 06/12/2014 2040   ALKPHOS 115 12/16/2014 1535   ALKPHOS 106 06/12/2014 2040   AST 24 12/16/2014 1535   AST 38* 06/12/2014 2040   ALT 18 12/16/2014 1535   ALT 15 06/12/2014 2040   BILITOT <0.20 12/16/2014 1535   BILITOT 0.4 06/12/2014 2040       No results found for: LABCA2  No components found for: LABCA125  No results for input(s): INR in the last 168 hours.  Urinalysis    Component Value Date/Time   COLORURINE YELLOW 06/12/2014 2231   APPEARANCEUR CLEAR 06/12/2014 2231   LABSPEC 1.007 06/12/2014 2231   PHURINE 6.5 06/12/2014 2231   GLUCOSEU NEGATIVE 06/12/2014 2231   HGBUR NEGATIVE 06/12/2014 2231   BILIRUBINUR NEGATIVE  06/12/2014 2231   KETONESUR NEGATIVE 06/12/2014 2231   PROTEINUR NEGATIVE 06/12/2014 2231   UROBILINOGEN 0.2 06/12/2014 2231   NITRITE NEGATIVE 06/12/2014 2231   LEUKOCYTESUR NEGATIVE 06/12/2014 2231  STUDIES: Mm Digital Diagnostic Unilat L  01/23/2015   CLINICAL DATA:  Post ultrasound-guided core needle biopsy suspicious left breast mass.  EXAM: DIAGNOSTIC LEFT MAMMOGRAM POST ULTRASOUND BIOPSY  COMPARISON:  Previous exam(s).  FINDINGS: Mammographic images were obtained following ultrasound guided biopsy of left breast mass, 2 o'clock position. Ribbon shaped marking clip in appropriate position.  IMPRESSION: Appropriate position ribbon shaped marking clip status post ultrasound-guided core needle biopsy left breast mass.  Final Assessment: Post Procedure Mammograms for Marker Placement   Electronically Signed   By: Lovey Newcomer M.D.   On: 01/23/2015 16:54   [place] Lt Breast Bx W Loc Dev 1st Lesion Img Bx Spec US Guide  01/24/2015   ADDENDUM REPORT: 01/24/2015 15:38  ADDENDUM: Pathology reveals Grade House Left breast invasive ductal carcinoma and ductal carcinoma in situ. This was found to be concordant by Dr. Claudie Revering. Pathology results were discussed with the patient and her daughter, Pamela House, via telephone. The patient reported tenderness at the biopsy site. Post biopsy instructions were reviewed and questions were answered. The patient and her daughter were encouraged to call [facility] with any additional questions and or concerns. The patient was referred to Dr. Autumn Messing of [facility] on Jan 29, 2015.  Pathology results reported by Terie Purser RN on Jan 24, 2015.   Electronically Signed   By: Lovey Newcomer M.D.   On: 01/24/2015 15:38   01/24/2015   CLINICAL DATA:  Patient with suspicious left breast mass.  EXAM: ULTRASOUND GUIDED LEFT BREAST CORE NEEDLE BIOPSY  COMPARISON:  Previous exam(s).  PROCEDURE: I met with the patient and we discussed the  procedure of ultrasound-guided biopsy, including benefits and alternatives. We discussed the high likelihood of a successful procedure. We discussed the risks of the procedure including infection, bleeding, tissue injury, clip migration, and inadequate sampling. Informed written consent was given. The usual time-out protocol was performed immediately prior to the procedure.  Using sterile technique and 2% Lidocaine as local anesthetic, under direct ultrasound visualization, a 12 gauge vacuum-assisted device was used to perform biopsy of left breast mass, 2 o'clock positionusing a lateral approach. At the conclusion of the procedure, a ribbon shaped tissue marker clip was deployed into the biopsy cavity. Follow-up 2-view mammogram was performed and dictated separately.  IMPRESSION: Ultrasound-guided biopsy of left breast mass, 2 o'clock position. No apparent complications.  Electronically Signed: By: Lovey Newcomer M.D. On: 01/23/2015 16:48    ASSESSMENT: 81 y.o. [place] woman status post left breast biopsy 01/23/2015 for a clinical T1b N0, stage IA invasive ductal carcinoma, grade 3, estrogen and progesterone receptor negative, but HER-2 amplified, with an MIB-1 of 79%  (1) genetics testing requested--patient with breast cancer, family history of ovarian cancer  (2) breast conserving surgery planned. I do not need sentinel lymph node sampling to plan her systemic therapy  (3) adjuvant anti-HER-2 immunotherapy plus or minus chemotherapy (Abraxane) to follow surgery  (4) radiation to follow chemotherapy as appropriate  PLAN: We spent the better part of today's hour-long appointment discussing the biology of breast cancer in general, and the specifics of the patient's tumor in particular. The patient and her family understand that cancer is not one disease but 100 different diseases and therefore information about her CML, lung cancer, colon cancer, etc. simply is not relevant to her current problem.    We reviewed the fact that she now has an invasive ductal breast cancer, that it is small and lymph  node negative, and that it should be first of all removed. Given her age it is not clear whether she should have adjuvant radiation at some point, but we will ask 1 of the radiation doctors to meet with her postop to discuss that option.  Once the cancer has been removed by surgery she will need adjuvant systemic therapy. Generally this will consist at least of anti-HER-2 immunotherapy. Because anti-HER-2 treatments can affect the heart muscle, we are obtaining an echocardiogram prior to her next visit here.  [name] does have multiple comorbidities. Because she has chronic myeloid leukemia I am not going to be able to give her myeloid growth factors. For that reason aggressive chemotherapy is out of the question. She might be able to tolerate Abraxane (liposomal paclitaxel). The plan would be to start the Abraxane initially and then add trastuzumab with the second dose, just to make sure she tolerates each agent well. At this point I am not planning to use pertuzumab given her history of diarrhea.  We discussed the possible toxicities side effects and complications of the planned treatment.  The patient has a good understanding of the overall plan. She agrees with it. She knows the goal of treatment in her case is cure. She will call with any problems that may develop before her next visit here, tentatively scheduled for 03/04/2015.  Pamela Cruel, MD   02/07/2015 6:02 PM Medical Oncology and Hematology [facility] [address] [place], [place] [address] Tel. [phone]    Fax. [phone]

## 2015-02-07 NOTE — Telephone Encounter (Signed)
Gave avs & calendar for June. Sent message for echo to be authorized.

## 2015-02-11 ENCOUNTER — Telehealth: Payer: Self-pay | Admitting: *Deleted

## 2015-02-11 NOTE — Telephone Encounter (Signed)
VM message from pt's daughter, Vernie Ammons. She was asking is her mother was strong enough for chemo for new breast cancer  Diagnosis. Pt. Saw Dr. Jana Hakim on 02/07/15 and per his note, surgery is most likely 1st option and then possible chemo but d/t numerous co-morbidities, possible treatments were discussed.  Daughter has called Dr. Ethlyn Gallery office (surgeon) and he will be back in the office on Thursday. She is waiting to hear when her mother's appt is.  Patient does have f/u appt with Dr. Jana Hakim on 03/04/15

## 2015-02-12 ENCOUNTER — Encounter: Payer: Self-pay | Admitting: Oncology

## 2015-02-12 ENCOUNTER — Telehealth: Payer: Self-pay | Admitting: Oncology

## 2015-02-12 NOTE — Progress Notes (Signed)
I placed fmla forms for daughter West Carbo on desk of nurse for dr. Jana Hakim

## 2015-02-12 NOTE — Telephone Encounter (Signed)
Confirmed appointment for ECHO.

## 2015-02-13 ENCOUNTER — Other Ambulatory Visit: Payer: Self-pay | Admitting: General Surgery

## 2015-02-13 DIAGNOSIS — C50912 Malignant neoplasm of unspecified site of left female breast: Secondary | ICD-10-CM

## 2015-02-17 ENCOUNTER — Telehealth: Payer: Self-pay | Admitting: Genetic Counselor

## 2015-02-18 NOTE — Telephone Encounter (Signed)
Spoke with patient's daughter, West Carbo. She stated her mother does not want to meet with the genetic counselor. She would like to cancel that appointment.

## 2015-02-19 ENCOUNTER — Telehealth: Payer: Self-pay | Admitting: *Deleted

## 2015-02-19 NOTE — Telephone Encounter (Signed)
Called [facility] vascular Lab and they have spoken with patient's daughter with instructions to check in at the [facility] for the Echocardiogram.

## 2015-02-19 NOTE — Telephone Encounter (Signed)
Daughter West Carbo called asking about appointment on 02-20-2015 and 02-21-2015.  Asked if the echocardiogram is needed.  Advised that this is needed pre-chemotherapy for baseline to determine if heart is strong enough.  Location is [facility].  Voicemail left with [facility] lab to call [place] with check in location and procedure.  No appointments noted in EPIC for tomorrow at this time.

## 2015-02-20 ENCOUNTER — Telehealth: Payer: Self-pay

## 2015-02-20 ENCOUNTER — Other Ambulatory Visit: Payer: Medicare Other

## 2015-02-20 ENCOUNTER — Encounter: Payer: Medicare Other | Admitting: Genetic Counselor

## 2015-02-20 NOTE — Telephone Encounter (Signed)
Signed FMLA form returned to managed care.

## 2015-02-21 ENCOUNTER — Ambulatory Visit (HOSPITAL_COMMUNITY)
Admission: RE | Admit: 2015-02-21 | Discharge: 2015-02-21 | Disposition: A | Discharge status: Home / Self Care | Payer: Medicare Other | Source: Ambulatory Visit | Attending: Oncology | Admitting: Oncology

## 2015-02-21 ENCOUNTER — Encounter: Payer: Self-pay | Admitting: Oncology

## 2015-02-21 DIAGNOSIS — I10 Essential (primary) hypertension: Secondary | ICD-10-CM | POA: Insufficient documentation

## 2015-02-21 DIAGNOSIS — C50919 Malignant neoplasm of unspecified site of unspecified female breast: Secondary | ICD-10-CM | POA: Diagnosis not present

## 2015-02-21 DIAGNOSIS — I071 Rheumatic tricuspid insufficiency: Secondary | ICD-10-CM | POA: Insufficient documentation

## 2015-02-21 DIAGNOSIS — Z09 Encounter for follow-up examination after completed treatment for conditions other than malignant neoplasm: Secondary | ICD-10-CM

## 2015-02-21 DIAGNOSIS — E785 Hyperlipidemia, unspecified: Secondary | ICD-10-CM | POA: Insufficient documentation

## 2015-02-21 NOTE — Progress Notes (Signed)
  Echocardiogram 2D Echocardiogram has been performed.  [name] 02/21/2015, 1:56 PM

## 2015-02-21 NOTE — Progress Notes (Signed)
I faxed fmla forms for Pearl(daughter) to  [phone] and will mail copy to patient for their records.

## 2015-02-24 ENCOUNTER — Telehealth: Payer: Self-pay | Admitting: Internal Medicine

## 2015-02-24 ENCOUNTER — Other Ambulatory Visit: Payer: Self-pay | Admitting: Internal Medicine

## 2015-02-24 ENCOUNTER — Telehealth: Payer: Self-pay | Admitting: *Deleted

## 2015-02-24 DIAGNOSIS — C9210 Chronic myeloid leukemia, BCR/ABL-positive, not having achieved remission: Secondary | ICD-10-CM

## 2015-02-24 MED ORDER — IMATINIB MESYLATE 400 MG PO TABS: 400.0000 mg | ORAL_TABLET | ORAL | Status: DC

## 2015-02-24 NOTE — Telephone Encounter (Signed)
Due to [facility] rescheduling 7/6 appointment. Per MM due to new breast dx 7/6 appointment with him can be cancelled and patient can see GM only. Spoke with patient she is aware. Confirmed 6/21 f/u with GM and cxd 7/6 with MM. Patient forwarded to triage re questions about medication.

## 2015-02-24 NOTE — Telephone Encounter (Signed)
Received transfer call from scheduling as patient had a question about medication.  She has been on Gleevac but is out of that medication.  She is aking for refill and when to start taking/how much.  She is scheduled for left breast lumpectomy this week. Pt is seeing Dr. Jana Hakim for new dx of breast cancer.

## 2015-02-24 NOTE — Telephone Encounter (Signed)
I did refill it for her until she sees Dr. Jana Hakim.

## 2015-02-25 ENCOUNTER — Encounter (HOSPITAL_BASED_OUTPATIENT_CLINIC_OR_DEPARTMENT_OTHER): Payer: Self-pay | Admitting: *Deleted

## 2015-02-25 ENCOUNTER — Telehealth: Payer: Self-pay | Admitting: *Deleted

## 2015-02-25 NOTE — Telephone Encounter (Signed)
PER DOCUMENTATION ON 02/21/15 A COPY OF PT.'S DAUGHTER'S FMLA WAS MAILED TO HER. NOTIFIED PT.'S DAUGHTER. SHE VOICES UNDERSTANDING.

## 2015-02-26 ENCOUNTER — Encounter (HOSPITAL_BASED_OUTPATIENT_CLINIC_OR_DEPARTMENT_OTHER)
Admission: RE | Admit: 2015-02-26 | Discharge: 2015-02-26 | Disposition: A | Discharge status: Home / Self Care | Payer: Medicare Other | Source: Ambulatory Visit | Attending: General Surgery | Admitting: General Surgery

## 2015-02-26 ENCOUNTER — Ambulatory Visit
Admission: RE | Admit: 2015-02-26 | Discharge: 2015-02-26 | Disposition: A | Discharge status: Home / Self Care | Payer: Medicare Other | Source: Ambulatory Visit | Attending: General Surgery | Admitting: General Surgery

## 2015-02-26 DIAGNOSIS — Z888 Allergy status to other drugs, medicaments and biological substances status: Secondary | ICD-10-CM | POA: Diagnosis not present

## 2015-02-26 DIAGNOSIS — Z86010 Personal history of colonic polyps: Secondary | ICD-10-CM | POA: Diagnosis not present

## 2015-02-26 DIAGNOSIS — I10 Essential (primary) hypertension: Secondary | ICD-10-CM | POA: Diagnosis not present

## 2015-02-26 DIAGNOSIS — C50912 Malignant neoplasm of unspecified site of left female breast: Secondary | ICD-10-CM

## 2015-02-26 DIAGNOSIS — C50412 Malignant neoplasm of upper-outer quadrant of left female breast: Secondary | ICD-10-CM | POA: Diagnosis not present

## 2015-02-26 LAB — POCT I-STAT, CHEM 8
BUN: 15 mg/dL (ref 6–20)
CHLORIDE: 105 mmol/L (ref 101–111)
Calcium, Ion: 1.39 mmol/L — ABNORMAL HIGH (ref 1.13–1.30)
Creatinine, Ser: 0.9 mg/dL (ref 0.44–1.00)
GLUCOSE: 105 mg/dL — AB (ref 65–99)
HCT: 45 % (ref 36.0–46.0)
Hemoglobin: 15.3 g/dL — ABNORMAL HIGH (ref 12.0–15.0)
POTASSIUM: 3.6 mmol/L (ref 3.5–5.1)
SODIUM: 143 mmol/L (ref 135–145)
TCO2: 22 mmol/L (ref 0–100)

## 2015-02-26 NOTE — Telephone Encounter (Signed)
See other note

## 2015-02-27 ENCOUNTER — Ambulatory Visit (HOSPITAL_BASED_OUTPATIENT_CLINIC_OR_DEPARTMENT_OTHER): Payer: Medicare Other | Admitting: Anesthesiology

## 2015-02-27 ENCOUNTER — Encounter (HOSPITAL_BASED_OUTPATIENT_CLINIC_OR_DEPARTMENT_OTHER)
Admission: RE | Disposition: A | Discharge status: Home / Self Care | Payer: Self-pay | Source: Ambulatory Visit | Attending: General Surgery

## 2015-02-27 ENCOUNTER — Ambulatory Visit (HOSPITAL_BASED_OUTPATIENT_CLINIC_OR_DEPARTMENT_OTHER)
Admission: RE | Admit: 2015-02-27 | Discharge: 2015-02-27 | Disposition: A | Discharge status: Home / Self Care | Payer: Medicare Other | Source: Ambulatory Visit | Attending: General Surgery | Admitting: General Surgery

## 2015-02-27 ENCOUNTER — Other Ambulatory Visit: Payer: Self-pay | Admitting: *Deleted

## 2015-02-27 ENCOUNTER — Encounter (HOSPITAL_BASED_OUTPATIENT_CLINIC_OR_DEPARTMENT_OTHER): Payer: Self-pay | Admitting: *Deleted

## 2015-02-27 ENCOUNTER — Ambulatory Visit
Admission: RE | Admit: 2015-02-27 | Discharge: 2015-02-27 | Disposition: A | Discharge status: Home / Self Care | Payer: Medicare Other | Source: Ambulatory Visit | Attending: General Surgery | Admitting: General Surgery

## 2015-02-27 ENCOUNTER — Telehealth: Payer: Self-pay | Admitting: *Deleted

## 2015-02-27 ENCOUNTER — Ambulatory Visit (HOSPITAL_COMMUNITY)
Admission: RE | Admit: 2015-02-27 | Discharge: 2015-02-27 | Disposition: A | Discharge status: Home / Self Care | Payer: Medicare Other | Source: Ambulatory Visit | Attending: General Surgery | Admitting: General Surgery

## 2015-02-27 DIAGNOSIS — C50412 Malignant neoplasm of upper-outer quadrant of left female breast: Secondary | ICD-10-CM | POA: Insufficient documentation

## 2015-02-27 DIAGNOSIS — Z86010 Personal history of colonic polyps: Secondary | ICD-10-CM | POA: Insufficient documentation

## 2015-02-27 DIAGNOSIS — I10 Essential (primary) hypertension: Secondary | ICD-10-CM | POA: Insufficient documentation

## 2015-02-27 DIAGNOSIS — Z888 Allergy status to other drugs, medicaments and biological substances status: Secondary | ICD-10-CM | POA: Diagnosis not present

## 2015-02-27 DIAGNOSIS — C50912 Malignant neoplasm of unspecified site of left female breast: Secondary | ICD-10-CM

## 2015-02-27 HISTORY — PX: BREAST LUMPECTOMY WITH RADIOACTIVE SEED AND SENTINEL LYMPH NODE BIOPSY: SHX6550

## 2015-02-27 SURGERY — BREAST LUMPECTOMY WITH RADIOACTIVE SEED AND SENTINEL LYMPH NODE BIOPSY
Anesthesia: General | Laterality: Left

## 2015-02-27 MED ORDER — SUFENTANIL CITRATE 50 MCG/ML IV SOLN
INTRAVENOUS | Status: DC | PRN
  Administered 2015-02-27 (×2): 5 ug via INTRAVENOUS

## 2015-02-27 MED ORDER — HEPARIN (PORCINE) IN NACL 2-0.9 UNIT/ML-% IJ SOLN
INTRAMUSCULAR | Status: AC
Start: 2015-02-27 — End: 2015-02-27
  Filled 2015-02-27: qty 500

## 2015-02-27 MED ORDER — DEXAMETHASONE SODIUM PHOSPHATE 4 MG/ML IJ SOLN
INTRAMUSCULAR | Status: DC | PRN
  Administered 2015-02-27: 8 mg via INTRAVENOUS

## 2015-02-27 MED ORDER — FENTANYL CITRATE (PF) 100 MCG/2ML IJ SOLN
INTRAMUSCULAR | Status: AC
Start: 2015-02-27 — End: 2015-02-27
  Filled 2015-02-27: qty 2

## 2015-02-27 MED ORDER — CEFAZOLIN SODIUM-DEXTROSE 2-3 GM-% IV SOLR
INTRAVENOUS | Status: AC
  Filled 2015-02-27: qty 50

## 2015-02-27 MED ORDER — CEFAZOLIN SODIUM-DEXTROSE 2-3 GM-% IV SOLR
2.0000 g | INTRAVENOUS | Status: AC
  Administered 2015-02-27: 2 g via INTRAVENOUS

## 2015-02-27 MED ORDER — OXYCODONE-ACETAMINOPHEN 5-325 MG PO TABS: 1.0000 | ORAL_TABLET | ORAL | Status: DC | PRN

## 2015-02-27 MED ORDER — PHENYLEPHRINE HCL 10 MG/ML IJ SOLN
INTRAMUSCULAR | Status: DC | PRN
  Administered 2015-02-27: 80 ug via INTRAVENOUS

## 2015-02-27 MED ORDER — PROPOFOL 10 MG/ML IV BOLUS
INTRAVENOUS | Status: DC | PRN
  Administered 2015-02-27: 40 mg via INTRAVENOUS
  Administered 2015-02-27: 100 mg via INTRAVENOUS

## 2015-02-27 MED ORDER — PHENYLEPHRINE HCL 10 MG/ML IJ SOLN
10.0000 mg | INTRAMUSCULAR | Status: DC | PRN
  Administered 2015-02-27: 40 ug/min via INTRAVENOUS

## 2015-02-27 MED ORDER — HEPARIN SOD (PORK) LOCK FLUSH 100 UNIT/ML IV SOLN
INTRAVENOUS | Status: AC
  Filled 2015-02-27: qty 5

## 2015-02-27 MED ORDER — SODIUM CHLORIDE 0.9 % IJ SOLN
INTRAMUSCULAR | Status: AC
  Filled 2015-02-27: qty 10

## 2015-02-27 MED ORDER — METHYLENE BLUE 1 % INJ SOLN
INTRAMUSCULAR | Status: AC
  Filled 2015-02-27: qty 10

## 2015-02-27 MED ORDER — LIDOCAINE HCL (CARDIAC) 20 MG/ML IV SOLN
INTRAVENOUS | Status: DC | PRN
  Administered 2015-02-27: 30 mg via INTRAVENOUS

## 2015-02-27 MED ORDER — LACTATED RINGERS IV SOLN
INTRAVENOUS | Status: DC
Start: 2015-02-27 — End: 2015-02-27
  Administered 2015-02-27 (×2): via INTRAVENOUS

## 2015-02-27 MED ORDER — ACETAMINOPHEN 500 MG PO TABS: 1000.0000 mg | ORAL_TABLET | Freq: Once | ORAL | Status: DC

## 2015-02-27 MED ORDER — SCOPOLAMINE 1 MG/3DAYS TD PT72: 1.0000 | MEDICATED_PATCH | Freq: Once | TRANSDERMAL | Status: DC | PRN

## 2015-02-27 MED ORDER — CHLORHEXIDINE GLUCONATE 4 % EX LIQD: 1.0000 "application " | Freq: Once | CUTANEOUS | Status: DC

## 2015-02-27 MED ORDER — FENTANYL CITRATE (PF) 100 MCG/2ML IJ SOLN
50.0000 ug | Freq: Once | INTRAMUSCULAR | Status: AC
  Administered 2015-02-27: 50 ug via INTRAVENOUS

## 2015-02-27 MED ORDER — TECHNETIUM TC 99M SULFUR COLLOID FILTERED
1.0000 | Freq: Once | INTRAVENOUS | Status: AC | PRN
  Administered 2015-02-27: 1 via INTRADERMAL

## 2015-02-27 MED ORDER — SODIUM BICARBONATE 4 % IV SOLN: INTRAVENOUS | Status: DC | PRN

## 2015-02-27 MED ORDER — BUPIVACAINE-EPINEPHRINE 0.25% -1:200000 IJ SOLN
INTRAMUSCULAR | Status: DC | PRN
  Administered 2015-02-27: 20 mL

## 2015-02-27 MED ORDER — FENTANYL CITRATE (PF) 100 MCG/2ML IJ SOLN: 25.0000 ug | INTRAMUSCULAR | Status: DC | PRN

## 2015-02-27 MED ORDER — GLYCOPYRROLATE 0.2 MG/ML IJ SOLN
INTRAMUSCULAR | Status: DC | PRN
  Administered 2015-02-27: 0.2 mg via INTRAVENOUS

## 2015-02-27 MED ORDER — MIDAZOLAM HCL 2 MG/2ML IJ SOLN: 1.0000 mg | INTRAMUSCULAR | Status: DC | PRN

## 2015-02-27 MED ORDER — ONDANSETRON HCL 4 MG/2ML IJ SOLN
INTRAMUSCULAR | Status: DC | PRN
  Administered 2015-02-27: 4 mg via INTRAVENOUS

## 2015-02-27 MED ORDER — ONDANSETRON HCL 4 MG/2ML IJ SOLN: 4.0000 mg | Freq: Once | INTRAMUSCULAR | Status: DC | PRN

## 2015-02-27 MED ORDER — SUFENTANIL CITRATE 50 MCG/ML IV SOLN
INTRAVENOUS | Status: AC
  Filled 2015-02-27: qty 1

## 2015-02-27 MED ORDER — ACETAMINOPHEN 160 MG/5ML PO SOLN: 960.0000 mg | Freq: Once | ORAL | Status: DC

## 2015-02-27 MED ORDER — MIDAZOLAM HCL 2 MG/2ML IJ SOLN
INTRAMUSCULAR | Status: AC
  Filled 2015-02-27: qty 2

## 2015-02-27 MED ORDER — 0.9 % SODIUM CHLORIDE (POUR BTL) OPTIME
TOPICAL | Status: DC | PRN
  Administered 2015-02-27: 1000 mL

## 2015-02-27 SURGICAL SUPPLY — 53 items
APPLIER CLIP 9.375 MED OPEN (MISCELLANEOUS) ×3
APR CLP MED 9.3 20 MLT OPN (MISCELLANEOUS) ×1
BAG DECANTER FOR FLEXI CONT (MISCELLANEOUS) ×1 IMPLANT
BLADE SURG 15 STRL LF DISP TIS (BLADE) ×1 IMPLANT
BLADE SURG 15 STRL SS (BLADE) ×6
CANISTER SUCT 1200ML W/VALVE (MISCELLANEOUS) IMPLANT
CHLORAPREP W/TINT 26ML (MISCELLANEOUS) ×3 IMPLANT
CLEANER CAUTERY TIP 5X5 PAD (MISCELLANEOUS) ×1 IMPLANT
CLIP APPLIE 9.375 MED OPEN (MISCELLANEOUS) IMPLANT
COVER BACK TABLE 60X90IN (DRAPES) ×3 IMPLANT
COVER MAYO STAND STRL (DRAPES) ×3 IMPLANT
DECANTER SPIKE VIAL GLASS SM (MISCELLANEOUS) IMPLANT
DRAPE C-ARM 42X72 X-RAY (DRAPES) ×1 IMPLANT
DRAPE LAPAROSCOPIC ABDOMINAL (DRAPES) ×3 IMPLANT
DRAPE UTILITY XL STRL (DRAPES) ×3 IMPLANT
ELECT REM PT RETURN 9FT ADLT (ELECTROSURGICAL) ×3
ELECTRODE REM PT RTRN 9FT ADLT (ELECTROSURGICAL) ×1 IMPLANT
GLOVE BIO SURGEON STRL SZ7 (GLOVE) ×2 IMPLANT
GLOVE BIO SURGEON STRL SZ7.5 (GLOVE) ×3 IMPLANT
GLOVE BIOGEL PI IND STRL 7.0 (GLOVE) IMPLANT
GLOVE BIOGEL PI IND STRL 7.5 (GLOVE) IMPLANT
GLOVE BIOGEL PI INDICATOR 7.0 (GLOVE) ×4
GLOVE BIOGEL PI INDICATOR 7.5 (GLOVE) ×2
GLOVE SURG SS PI 7.5 STRL IVOR (GLOVE) ×2 IMPLANT
GOWN STRL REUS W/ TWL LRG LVL3 (GOWN DISPOSABLE) ×2 IMPLANT
GOWN STRL REUS W/TWL LRG LVL3 (GOWN DISPOSABLE) ×9
IV KIT MINILOC 20X1 SAFETY (NEEDLE) IMPLANT
LIQUID BAND (GAUZE/BANDAGES/DRESSINGS) ×3 IMPLANT
NDL HYPO 25X1 1.5 SAFETY (NEEDLE) ×1 IMPLANT
NDL SAFETY ECLIPSE 18X1.5 (NEEDLE) IMPLANT
NDL SPNL 22GX3.5 QUINCKE BK (NEEDLE) IMPLANT
NEEDLE HYPO 18GX1.5 SHARP (NEEDLE)
NEEDLE HYPO 22GX1.5 SAFETY (NEEDLE) IMPLANT
NEEDLE HYPO 25X1 1.5 SAFETY (NEEDLE) ×3 IMPLANT
NEEDLE SPNL 22GX3.5 QUINCKE BK (NEEDLE) IMPLANT
PACK BASIN DAY SURGERY FS (CUSTOM PROCEDURE TRAY) ×3 IMPLANT
PAD CLEANER CAUTERY TIP 5X5 (MISCELLANEOUS)
PENCIL BUTTON HOLSTER BLD 10FT (ELECTRODE) ×3 IMPLANT
SLEEVE SCD COMPRESS KNEE MED (MISCELLANEOUS) ×2 IMPLANT
SUT MON AB 4-0 PC3 18 (SUTURE) ×3 IMPLANT
SUT PROLENE 2 0 SH DA (SUTURE) ×3 IMPLANT
SUT SILK 2 0 TIES 17X18 (SUTURE)
SUT SILK 2-0 18XBRD TIE BLK (SUTURE) IMPLANT
SUT VIC AB 3-0 SH 27 (SUTURE) ×3
SUT VIC AB 3-0 SH 27X BRD (SUTURE) ×1 IMPLANT
SYR 5ML LL (SYRINGE) ×1 IMPLANT
SYR CONTROL 10ML LL (SYRINGE) ×4 IMPLANT
SYRINGE 10CC LL (SYRINGE) ×2 IMPLANT
TOWEL OR 17X24 6PK STRL BLUE (TOWEL DISPOSABLE) ×6 IMPLANT
TOWEL OR NON WOVEN STRL DISP B (DISPOSABLE) ×3 IMPLANT
TUBE CONNECTING 20'X1/4 (TUBING)
TUBE CONNECTING 20X1/4 (TUBING) IMPLANT
YANKAUER SUCT BULB TIP NO VENT (SUCTIONS) ×2 IMPLANT

## 2015-02-27 NOTE — H&P (Signed)
  Upon further discussion with the patient and family she does not want chemotherapy or the port a cath. Therefore I will plan today only for the left breast radioactive seed localized lumpectomy and sentinel node biopsy.

## 2015-02-27 NOTE — Progress Notes (Signed)
Emotional support while nuc med injection,  Tolerated well

## 2015-02-27 NOTE — Anesthesia Procedure Notes (Signed)
Procedure Name: Intubation Date/Time: 02/27/2015 10:24 AM Performed by: Melynda Ripple D Pre-anesthesia Checklist: Patient identified, Emergency Drugs available, Suction available and Patient being monitored Patient Re-evaluated:Patient Re-evaluated prior to inductionOxygen Delivery Method: Circle System Utilized Preoxygenation: Pre-oxygenation with 100% oxygen Intubation Type: IV induction Ventilation: Mask ventilation without difficulty Laryngoscope Size: Mac and 3 Grade View: Grade I Tube type: Oral Tube size: 7.0 mm Number of attempts: 1 Airway Equipment and Method: Stylet and Oral airway Placement Confirmation: ETT inserted through vocal cords under direct vision,  positive ETCO2 and breath sounds checked- equal and bilateral Secured at: 22 cm Tube secured with: Tape Dental Injury: Teeth and Oropharynx as per pre-operative assessment

## 2015-02-27 NOTE — Interval H&P Note (Signed)
History and Physical Interval Note:  02/27/2015 9:36 AM  Pamela House  has presented today for surgery, with the diagnosis of Left Breast Cancer  The various methods of treatment have been discussed with the patient and family. After consideration of risks, benefits and other options for treatment, the patient has consented to  Procedure(s): LEFT BREAST LUMPECTOMY WITH RADIOACTIVE SEED AND SENTINEL LYMPH NODE BIOPSY (Left) INSERTION PORT-A-CATH as a surgical intervention .  The patient's history has been reviewed, patient examined, no change in status, stable for surgery.  I have reviewed the patient's chart and labs.  Questions were answered to the patient's satisfaction.     TOTH III,PAUL S

## 2015-02-27 NOTE — Discharge Instructions (Signed)
Post Anesthesia Home Care Instructions  Activity: Get plenty of rest for the remainder of the day. A responsible adult should stay with you for 24 hours following the procedure.  For the next 24 hours, DO NOT: -Drive a car -Paediatric nurse -Drink alcoholic beverages -Take any medication unless instructed by your physician -Make any legal decisions or sign important papers.  Meals: Start with liquid foods such as gelatin or soup. Progress to regular foods as tolerated. Avoid greasy, spicy, heavy foods. If nausea and/or vomiting occur, drink only clear liquids until the nausea and/or vomiting subsides. Call your physician if vomiting continues.  Special Instructions/Symptoms: Your throat may feel dry or sore from the anesthesia or the breathing tube placed in your throat during surgery. If this causes discomfort, gargle with warm salt water. The discomfort should disappear within 24 hours.  If you had a scopolamine patch placed behind your ear for the management of post- operative nausea and/or vomiting:  1. The medication in the patch is effective for 72 hours, after which it should be removed.  Wrap patch in a tissue and discard in the trash. Wash hands thoroughly with soap and water. 2. You may remove the patch earlier than 72 hours if you experience unpleasant side effects which may include dry mouth, dizziness or visual disturbances. 3. Avoid touching the patch. Wash your hands with soap and water after contact with the patch.

## 2015-02-27 NOTE — Transfer of Care (Signed)
Immediate Anesthesia Transfer of Care Note  Patient: Pamela House  Procedure(s) Performed: Procedure(s): LEFT BREAST LUMPECTOMY WITH RADIOACTIVE SEED AND SENTINEL LYMPH NODE BIOPSY (Left)  Patient Location: PACU  Anesthesia Type:General  Level of Consciousness: awake, alert  and oriented  Airway & Oxygen Therapy: Patient Spontanous Breathing and Patient connected to face mask oxygen  Post-op Assessment: Report given to RN and Post -op Vital signs reviewed and stable  Post vital signs: Reviewed and stable  Last Vitals:  Filed Vitals:   02/27/15 1202  BP:   Pulse: 90  Temp: 36.4 C  Resp: 19    Complications: No apparent anesthesia complications

## 2015-02-27 NOTE — H&P (Signed)
Pamela House 01/29/2015 4:14 PM Location: [facility] Patient #: [id number] DOB: [birth year] Widowed / Language: Cleophus Molt / Race: Black or African American Female  History of Present Illness Sammuel Hines. Marlou Starks MD; 02/04/2015 10:35 AM) Patient words: left breast cancer.  The patient is a 81 year old female who presents with breast cancer. We are asked to see the patient in consultation by Dr. Lovey Newcomer to evaluate her for a left breast cancer. The patient is an 81 year old 89 female who recently underwent screening mammogram. At that time she was found to have a mass in the upper outer quadrant of the left breast measuring about 8 mm She has some dementia and is not sure how long the mass has been there. She denies any breast pain. She denies any discharge from the nipple. The mass measured 8 mm by ultrasound and was ER and PR negative and HER-2 positive.   Other Problems Ventura Sellers, [place]; 01/29/2015 4:14 PM) Bladder Problems Breast Cancer Cancer High blood pressure Inguinal Hernia Lump In Breast Other disease, cancer, significant illness  Past Surgical History Ventura Sellers, [place]; 01/29/2015 4:14 PM) Breast Biopsy Left. Cataract Surgery Bilateral. Colon Polyp Removal - Colonoscopy  Diagnostic Studies History Ventura Sellers, [place]; 01/29/2015 4:14 PM) Colonoscopy 1-5 years ago Mammogram within last year  Allergies Ventura Sellers, CMA; 01/29/2015 4:15 PM) ACE Inhibitors Hyzaar *ANTIHYPERTENSIVES* Lipitor *ANTIHYPERLIPIDEMICS* Losartan Potassium *ANTIHYPERTENSIVES* Tiazac *CALCIUM CHANNEL BLOCKERS*  Medication History Ventura Sellers, CMA; 01/29/2015 4:21 PM) Diltiazem CD (240MG Capsule ER 24HR, Oral) Active. Valsartan (160MG Tablet, Oral) Active. Omeprazole (20MG Capsule DR, Oral) Active. Xanax (0.5MG Tablet, Oral) Active. Donepezil HCl (5MG Tablet, Oral) Active. Furosemide (20MG Tablet, Oral) Active. Potassium Chloride ER  (10MEQ Capsule ER, Oral) Active. Vitamin D (50000UNIT Capsule, Oral) Active. Fluticasone Propionate (50MCG/ACT Suspension, Nasal) Active. Gleevec (400MG Tablet, Oral) Active. Metamucil (Oral) Active. Bayer Low Dose (81MG Tablet Chewable, Oral) Active. Robitussin Chest Congestion (100MG/5ML Syrup, Oral) Active. Medications Reconciled  Social History Ventura Sellers, [place]; 01/29/2015 4:14 PM) Caffeine use Coffee. No alcohol use No drug use Tobacco use Never smoker.  Family History Ventura Sellers, [place]; 01/29/2015 4:14 PM) Cancer Son. Cerebrovascular Accident Son. Diabetes Mellitus Sister. Heart Disease Brother. Hypertension Brother, Daughter, Sister, Son. Ovarian Cancer Sister. Seizure disorder Son.  Pregnancy / Birth History Ventura Sellers, [place]; 01/29/2015 4:14 PM) Age at menarche 73 years. Gravida 5 Irregular periods Maternal age 30-20 Para 5  Review of Systems Sharyn Lull R. Brooks CMA; 01/29/2015 4:14 PM) General Present- Appetite Loss, Night Sweats and Weight Loss. Not Present- Chills, Fatigue, Fever and Weight Gain. Skin Present- Dryness. Not Present- Change in Wart/Mole, Hives, Jaundice, New Lesions, Non-Healing Wounds, Rash and Ulcer. HEENT Present- Hearing Loss, Nose Bleed, Sinus Pain and Wears glasses/contact lenses. Not Present- Earache, Hoarseness, Oral Ulcers, Ringing in the Ears, Seasonal Allergies, Sore Throat, Visual Disturbances and Yellow Eyes. Respiratory Present- Chronic Cough and Difficulty Breathing. Not Present- Bloody sputum, Snoring and Wheezing. Breast Present- Breast Mass. Not Present- Breast Pain, Nipple Discharge and Skin Changes. Cardiovascular Present- Leg Cramps, Shortness of Breath and Swelling of Extremities. Not Present- Chest Pain, Difficulty Breathing Lying Down, Palpitations and Rapid Heart Rate. Gastrointestinal Present- Indigestion. Not Present- Abdominal Pain, Bloating, Bloody Stool, Change in Bowel Habits, Chronic  diarrhea, Constipation, Difficulty Swallowing, Excessive gas, Gets full quickly at meals, Hemorrhoids, Nausea, Rectal Pain and Vomiting. Female Genitourinary Present- Frequency and Nocturia. Not Present- Painful Urination, Pelvic Pain and Urgency. Musculoskeletal Present- Back Pain and Joint  Stiffness. Not Present- Joint Pain, Muscle Pain, Muscle Weakness and Swelling of Extremities. Neurological Present- Decreased Memory. Not Present- Fainting, Headaches, Numbness, Seizures, Tingling, Tremor, Trouble walking and Weakness. Psychiatric Present- Anxiety. Not Present- Bipolar, Change in Sleep Pattern, Depression, Fearful and Frequent crying. Hematology Present- Easy Bruising. Not Present- Excessive bleeding, Gland problems, HIV and Persistent Infections.   Vitals [employer] R. Brooks CMA; 01/29/2015 4:14 PM) 01/29/2015 4:14 PM Weight: 137.38 lb Height: 63in Body Surface Area: 1.66 m Body Mass Index: 24.33 kg/m BP: 150/80 (Sitting, Left Arm, Standard)    Physical Exam Eddie Dibbles S. Marlou Starks MD; 02/04/2015 10:34 AM) General Mental Status-Alert. General Appearance-Consistent with stated age. Hydration-Well hydrated. Voice-Normal.  Head and Neck Head-normocephalic, atraumatic with no lesions or palpable masses. Trachea-midline. Thyroid Gland Characteristics - normal size and consistency.  Eye Eyeball - Bilateral-Extraocular movements intact. Sclera/Conjunctiva - Bilateral-No scleral icterus.  Chest and Lung Exam Chest and lung exam reveals -quiet, even and easy respiratory effort with no use of accessory muscles and on auscultation, normal breath sounds, no adventitious sounds and normal vocal resonance. Inspection Chest Wall - Normal. Back - normal.  Breast Note: There is no palpable mass in either breast. There is no palpable axillary, supraclavicular, or cervical lymphadenopathy.   Cardiovascular Cardiovascular examination reveals -normal heart sounds, regular  rate and rhythm with no murmurs and normal pedal pulses bilaterally.  Abdomen Inspection Inspection of the abdomen reveals - No Hernias. Skin - Scar - no surgical scars. Palpation/Percussion Palpation and Percussion of the abdomen reveal - Soft, Non Tender, No Rebound tenderness, No Rigidity (guarding) and No hepatosplenomegaly. Auscultation Auscultation of the abdomen reveals - Bowel sounds normal.  Neurologic Neurologic evaluation reveals -alert and oriented x 3 with no impairment of recent or remote memory. Mental Status-Normal.  Musculoskeletal Normal Exam - Left-Upper Extremity Strength Normal and Lower Extremity Strength Normal. Normal Exam - Right-Upper Extremity Strength Normal and Lower Extremity Strength Normal.  Lymphatic Head & Neck  General Head & Neck Lymphatics: Bilateral - Description - Normal. Axillary  General Axillary Region: Bilateral - Description - Normal. Tenderness - Non Tender. Femoral & Inguinal  Generalized Femoral & Inguinal Lymphatics: Bilateral - Description - Normal. Tenderness - Non Tender.    Assessment & Plan Eddie Dibbles S. Marlou Starks MD; 01/29/2015 4:48 PM) PRIMARY CANCER OF UPPER OUTER QUADRANT OF LEFT FEMALE BREAST (174.4  C50.412) Impression: The patient appears to have a small stage I cancer of the upper outer left breast. Because of her age and other medical conditions I think she would probably tolerate a lumpectomy with sentinel node mapping best. I will talk with her medical oncologist about what else she would be able to tolerate as far as adjuvant treatment goes. I will then plan for a left breast radioactive seed localized lumpectomy with sentinel node mapping.  The oncologist also desires to give her chemotherapy and would like to have a port placed   Signed by Luella Cook, MD (02/04/2015 10:36 AM)

## 2015-02-27 NOTE — Op Note (Signed)
02/27/2015  11:49 AM  PATIENT:  Pamela House  81 y.o. female  PRE-OPERATIVE DIAGNOSIS:  Left Breast Cancer  POST-OPERATIVE DIAGNOSIS:  Left Breast Cancer  PROCEDURE:  Procedure(s): LEFT BREAST LUMPECTOMY WITH RADIOACTIVE SEED AND SENTINEL LYMPH NODE BIOPSY (Left)  SURGEON:  Surgeon(s) and Role:    * Jovita Kussmaul, MD - Primary  PHYSICIAN ASSISTANT:   ASSISTANTS: none   ANESTHESIA:   general  EBL:  Total I/O In: 400 [I.V.:400] Out: -   BLOOD ADMINISTERED:none  DRAINS: none   LOCAL MEDICATIONS USED:  MARCAINE     SPECIMEN:  Source of Specimen:  left breast tissue and sentinel nodes X 4  DISPOSITION OF SPECIMEN:  PATHOLOGY  COUNTS:  YES  TOURNIQUET:  * No tourniquets in log *  DICTATION: .Dragon Dictation  After informed consent was obtained the patient was brought to the operating room and placed in the supine position on the operating room table. After adequate induction of general anesthesia the patient's left chest, breast, and axillary area were prepped with ChloraPrep, allowed to dry, and draped in usual sterile manner. Earlier in the day the patient underwent injection of 1 mCi of technetium sulfur colloid in the subareolar position on the left. Previously an iodine 125 seed had been placed in the outer aspect of the left breast to mark an area of breast cancer. With the neoprobe set on technetium a hot spot in the left axilla was identified. A transversely oriented incision was made with a 15 blade knife overlying the hot spot. The incision was carried through the skin and subcutaneous tissue sharply with the electrocautery until the axilla was entered. A [place] retractor was deployed. Using the neoprobe we identified 4 lymph nodes. Each was excised sharply with the electrocautery and the lymphatics were controlled with clips. Ex vivo counts on these lymph nodes ranged from [phone]. These were sent to pathology as sentinel nodes. Hemostasis was achieved using the  Bovie electrocautery. Wound was infiltrated with quarter percent Marcaine. The deep layer of the wound was closed with interrupted 3-0 Vicryl stitches. The skin was closed with a running 4-0 Monocryl subcuticular stitch. Attention was then turned to the left breast. The neoprobe was set to I-125. The area of radioactivity was identified in the lateral aspect of the left breast. An elliptical incision was made in the skin overlying the area of radioactivity. The incision was carried through the skin and subcutaneous tissue sharply with the electrocautery. While checking the area of radioactivity frequently a circular portion of breast tissue was excised sharply around the radioactive seed. This dissection was carried to the chest wall. Once the specimen was removed it was oriented with the appropriate paint colors. A specimen radiograph was obtained that showed the clip and seed to be in the center of the specimen. The specimen was then sent to pathology for further evaluation. Hemostasis was achieved using the Bovie electrocautery. The wound was infiltrated with quarter percent Marcaine and irrigated with saline. The deep layer of the wound was closed with interrupted 3-0 Vicryl stitches. The skin was closed with interrupted 4-0 Monocryl subcuticular stitches. Dermabond dressings were applied. The patient tolerated the procedure well. At the end of the case all needle sponge and instrument counts were correct. The patient was then awakened and taken to recovery in stable condition.  PLAN OF CARE: Discharge to home after PACU  PATIENT DISPOSITION:  PACU - hemodynamically stable.   Delay start of Pharmacological VTE agent (>24hrs) due to surgical  blood loss or risk of bleeding: not applicable

## 2015-02-27 NOTE — Anesthesia Postprocedure Evaluation (Signed)
  Anesthesia Post-op Note  Patient: Pamela House  Procedure(s) Performed: Procedure(s): LEFT BREAST LUMPECTOMY WITH RADIOACTIVE SEED AND SENTINEL LYMPH NODE BIOPSY (Left)  Patient Location: PACU  Anesthesia Type:General  Level of Consciousness: awake, alert  and oriented  Airway and Oxygen Therapy: Patient Spontanous Breathing and Patient connected to nasal cannula oxygen  Post-op Pain: mild  Post-op Assessment: Post-op Vital signs reviewed, Patient's Cardiovascular Status Stable, Respiratory Function Stable, Patent Airway and Pain level controlled              Post-op Vital Signs: stable  Last Vitals:  Filed Vitals:   02/27/15 1245  BP:   Pulse: 77  Temp:   Resp: 17    Complications: No apparent anesthesia complications

## 2015-02-27 NOTE — Anesthesia Preprocedure Evaluation (Signed)
Anesthesia Evaluation  Patient identified by MRN, date of birth, ID band Patient awake    Reviewed: Allergy & Precautions, NPO status , Patient's Chart, lab work & pertinent test results  Airway Mallampati: II  TM Distance: >3 FB Neck ROM: Full    Dental  (+) Teeth Intact, Dental Advisory Given   Pulmonary  breath sounds clear to auscultation        Cardiovascular hypertension, Rhythm:Regular Rate:Normal     Neuro/Psych    GI/Hepatic   Endo/Other    Renal/GU      Musculoskeletal   Abdominal   Peds  Hematology   Anesthesia Other Findings   Reproductive/Obstetrics                             Anesthesia Physical Anesthesia Plan  ASA: III  Anesthesia Plan: General   Post-op Pain Management:    Induction: Intravenous  Airway Management Planned: LMA  Additional Equipment:   Intra-op Plan:   Post-operative Plan: Extubation in OR  Informed Consent: I have reviewed the patients History and Physical, chart, labs and discussed the procedure including the risks, benefits and alternatives for the proposed anesthesia with the patient or authorized representative who has indicated his/her understanding and acceptance.     Plan Discussed with: CRNA and Anesthesiologist  Anesthesia Plan Comments:         Anesthesia Quick Evaluation

## 2015-02-27 NOTE — Telephone Encounter (Addendum)
Voicemail from [place] at 1220 retrieved at 1238 that "[facility] needs this [place] order re-submitted."  This nurse called [employer].  Spoke with Donnie and refill information given as entered for 02-24-2015.  Called [facility] to notify her this should be ready for pick up later today.

## 2015-02-27 NOTE — Telephone Encounter (Signed)
Patient's daughter called reporting the [facility] told her this morning they have not heard from this office for refill.  Informed her [place] was called in 02-24-2015 to pharmacist Donnie.  Pamela House will call pharmacy back.  Mom is in surgery at this time.

## 2015-02-28 ENCOUNTER — Encounter (HOSPITAL_BASED_OUTPATIENT_CLINIC_OR_DEPARTMENT_OTHER): Payer: Self-pay | Admitting: General Surgery

## 2015-02-28 NOTE — Addendum Note (Signed)
Addendum  created 02/28/15 1106 by Willa Frater, CRNA   Modules edited: Charges VN

## 2015-03-04 ENCOUNTER — Ambulatory Visit: Payer: Medicare Other | Admitting: Oncology

## 2015-03-05 ENCOUNTER — Other Ambulatory Visit: Payer: Self-pay | Admitting: *Deleted

## 2015-03-05 ENCOUNTER — Telehealth: Payer: Self-pay | Admitting: *Deleted

## 2015-03-05 ENCOUNTER — Telehealth: Payer: Self-pay | Admitting: Oncology

## 2015-03-05 NOTE — Telephone Encounter (Signed)
Called and left a message with a new appointment per pof

## 2015-03-10 NOTE — Telephone Encounter (Signed)
Discussed results and follow up test. See other note.

## 2015-03-19 ENCOUNTER — Ambulatory Visit: Payer: Medicare Other | Admitting: Internal Medicine

## 2015-03-19 ENCOUNTER — Other Ambulatory Visit: Payer: Medicare Other

## 2015-03-20 ENCOUNTER — Ambulatory Visit: Payer: Medicare Other | Admitting: Oncology

## 2015-03-21 ENCOUNTER — Ambulatory Visit (HOSPITAL_BASED_OUTPATIENT_CLINIC_OR_DEPARTMENT_OTHER): Payer: Medicare Other | Admitting: Nurse Practitioner

## 2015-03-21 ENCOUNTER — Telehealth: Payer: Self-pay | Admitting: Oncology

## 2015-03-21 ENCOUNTER — Other Ambulatory Visit: Payer: Self-pay | Admitting: Nurse Practitioner

## 2015-03-21 ENCOUNTER — Encounter: Payer: Self-pay | Admitting: Nurse Practitioner

## 2015-03-21 VITALS — BP 154/65 | HR 60 | Temp 98.2°F | Resp 18 | Ht 63.0 in | Wt 138.2 lb

## 2015-03-21 DIAGNOSIS — Z171 Estrogen receptor negative status [ER-]: Secondary | ICD-10-CM | POA: Diagnosis not present

## 2015-03-21 DIAGNOSIS — C50912 Malignant neoplasm of unspecified site of left female breast: Secondary | ICD-10-CM

## 2015-03-21 DIAGNOSIS — C9210 Chronic myeloid leukemia, BCR/ABL-positive, not having achieved remission: Secondary | ICD-10-CM | POA: Diagnosis not present

## 2015-03-21 NOTE — Progress Notes (Signed)
[place]  [phone] [phone]     ID: Pamela House DOB: Aug 14, 1930  MR#: [id number]  LGX#:[id number]  Patient Care Team: Wenda Low, MD as PCP - General (Internal Medicine) Chauncey Cruel, MD as Consulting Physician (Oncology) Autumn Messing III, MD as Consulting Physician (General Surgery) PCP: Wenda Low, MD GYN: OTHER MD:  CHIEF COMPLAINT: HER-2 positive early stage breast cancer  CURRENT TREATMENT: anti-HER2 therapy   BREAST CANCER HISTORY: Pamela House has a history of chronic myeloid leukemia followed by Dr. Earlie Server. She is treated with imatinib and her most recent BCR.ABL/ABL ratio was nearly unmeasurable.   On 12/25/2014 the patient had routine bilateral screening mammography at the breast Center, showing a breast density category B. There was a possible mass in the left breast and the patient was recalled for left diagnostic mammography with tomosynthesis and left breast ultrasonography 01/02/2015. This showed an 8 mm mass with indistinct margins in the posterior third of the outer left breast, which was palpable at the 2:30 position 8 cm from the nipple. Ultrasound confirmed an oval solid mass measuring 8 mm. Ultrasound of the left axilla showed no abnormal lymphadenopathy.  Biopsy of the left breast mass in question 01/23/2015 showed ([id number]) an invasive ductal carcinoma, grade 3, estrogen and progesterone receptor negative, but with HER-2 amplification, the signals ratio being 2.38, and the number per cell 3.45. MIB-1 was 79%.  The patient's case was presented at the multidisciplinary breast cancer conference 01/29/2015. At that time it was felt that breast conserving surgery would be the first step, to be followed by adjuvant chemotherapy and anti-HER-2 immunotherapy.  The patient's subsequent history is as detailed below  INTERVAL HISTORY:  Pamela House returns for follow up of her breast cancer, accompanied by her  daughter. She had surgery 3 weeks ago, but is still in a fair amount of pain. She has not used any of her narcotics yet, because she is afraid of the side effects, but so far the tylenol has been ineffective. Otherwise she is doing well. She is here to discuss adjuvant therapy.  REVIEW OF SYSTEMS:  Pamela House denies fevers or chills. She had some nausea after surgery but this has resolved. She has chronic diarrhea and stress urinary incontinence. Her appetite is diminished and she has associated fatigue and weakness. She has chronic joint pains. She denies shortness of breath, chest pain, cough, or palpitations. She endorse anxiety but no depression. A detailed review of systems is otherwise stable.  PAST MEDICAL HISTORY: Past Medical History  Diagnosis Date  . Dyslipidemia   . GERD (gastroesophageal reflux disease)   . Dementia   . Anxiety   . Allergic rhinitis   . Hypertension   . DDD (degenerative disc disease)   . Osteoporosis   . Vitamin D deficiency   . Leukemia     PAST SURGICAL HISTORY: Past Surgical History  Procedure Laterality Date  . Back surgery  1984  . Breast lumpectomy with radioactive seed and sentinel lymph node biopsy Left 02/27/2015    Procedure: LEFT BREAST LUMPECTOMY WITH RADIOACTIVE SEED AND SENTINEL LYMPH NODE BIOPSY;  Surgeon: Autumn Messing III, MD;  Location: [facility];  Service: General;  Laterality: Left;    FAMILY HISTORY No family history on file. The patient's father died at the age of 79. The patient's mother died in her 79U from complications of diabetes. The patient had one full brother and one full sister there this fall sister had ovarian cancer diagnosed  late in life. The patient had 14 additional half siblings. There is no other history of breast cancer in the family.  GYNECOLOGIC HISTORY:  No LMP recorded. Patient has had a hysterectomy. Menarche age 40, first live birth age 81. She is GX P5. Pamela House is not sure when she went through  the change of life, but she had a hysterectomy remotely. She is not sure whether the ovaries were removed. She did not take hormone replacement.  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 Her husband died in 11-14-1999 and she lives by herself, with no pets, in the [employer] school apartments off some. There are exercise groups there but not a gym. Her daughter Pamela House is Agricultural consultant for a shipping company in [place]. Granddaughter Pamela House (pronounced "Trey Paula") is a Education officer, museum for the child protective services in [place]. The patient has 2 sons who have died. She attends a [employer].    ADVANCED DIRECTIVES: Her daughter Pamela House is her healthcare power of attorney. She can be reached at [phone].   HEALTH MAINTENANCE: History  Substance Use Topics  . Smoking status: Never Smoker   . Smokeless tobacco: Not on file  . Alcohol Use: No     Colonoscopy:  PAP:  Bone density:  Lipid panel:  Allergies  Allergen Reactions  . Ace Inhibitors Cough  . Hyzaar [Losartan Potassium-Hctz] Cough  . Lipitor [Atorvastatin]     Muscle fatigue  . Losartan Cough  . Tiazac [Diltiazem Hcl Er Beads] Cough    Current Outpatient Prescriptions  Medication Sig Dispense Refill  . ALPRAZolam (XANAX) 0.5 MG tablet Take 0.5 mg by mouth 2 (two) times daily as needed for anxiety.     Marland Kitchen diltiazem (CARDIZEM CD) 240 MG 24 hr capsule Take 240 mg by mouth daily.    Marland Kitchen donepezil (ARICEPT) 5 MG tablet Take 5 mg by mouth at bedtime.    . ergocalciferol (VITAMIN D2) 50000 UNITS capsule Take 50,000 Units by mouth every 30 (thirty) days.    . furosemide (LASIX) 20 MG tablet Take 20 mg by mouth daily.    Marland Kitchen imatinib (GLEEVEC) 400 MG tablet Take 1 tablet (400 mg total) by mouth See admin instructions. Take with meals and large glass of water.Caution:Chemotherapy. Takes on Tues, thurs, sat, sun 30 tablet 0  . potassium chloride (K-DUR) 10  MEQ tablet Take 10 mEq by mouth daily.    . Probiotic Product (ALIGN PO) Take by mouth.    . valsartan (DIOVAN) 160 MG tablet Take 160 mg by mouth daily.     Marland Kitchen aspirin 81 MG tablet Take 81 mg by mouth daily. 2-3 times weekly    . diphenoxylate-atropine (LOMOTIL) 2.5-0.025 MG per tablet   0  . fluticasone (FLONASE) 50 MCG/ACT nasal spray Place 1 spray into both nostrils daily as needed for allergies or rhinitis.    Marland Kitchen guaiFENesin-dextromethorphan (ROBITUSSIN DM) 100-10 MG/5ML syrup Take 5 mLs by mouth every 4 (four) hours as needed for cough.    Marland Kitchen omeprazole (PRILOSEC) 20 MG capsule Take 20 mg by mouth 2 (two) times daily before a meal.     . oxyCODONE-acetaminophen (ROXICET) 5-325 MG per tablet Take 1-2 tablets by mouth every 4 (four) hours as needed. (Patient not taking: Reported on 03/21/2015) 50 tablet 0  . promethazine (PHENERGAN) 25 MG tablet Take 25 mg by mouth every 6 (six) hours as needed for nausea.     Marland Kitchen  psyllium (METAMUCIL) 58.6 % powder Take 1 packet by mouth 3 (three) times daily.     No current facility-administered medications for this visit.    OBJECTIVE: Older white woman who appears stated age 27 Vitals:   03/21/15 1433  BP: 154/65  Pulse: 60  Temp: 98.2 F (36.8 C)  Resp: 18     Body mass index is 24.49 kg/(m^2).    ECOG FS:1 - Symptomatic but completely ambulatory  Skin: warm, dry  HEENT: sclerae anicteric, conjunctivae pink, oropharynx clear. No thrush or mucositis.  Lymph Nodes: No cervical or supraclavicular lymphadenopathy  Lungs: clear to auscultation bilaterally, no rales, wheezes, or rhonci  Heart: regular rate and rhythm  Abdomen: round, soft, non tender, positive bowel sounds  Musculoskeletal: No focal spinal tenderness, no peripheral edema  Neuro: non focal, well oriented, positive affect  Breasts: left breast status post lumpectomy. Left axilla status post sentinel lymph node biopsy. Incision clean and dry. No erythema or dehiscence.    LAB  RESULTS:  CMP     Component Value Date/Time   NA 143 02/26/2015 1231   NA 142 12/16/2014 1535   K 3.6 02/26/2015 1231   K 4.0 12/16/2014 1535   CL 105 02/26/2015 1231   CL 106 02/21/2013 1355   CO2 26 12/16/2014 1535   CO2 24 06/12/2014 2040   GLUCOSE 105* 02/26/2015 1231   GLUCOSE 79 12/16/2014 1535   GLUCOSE 92 02/21/2013 1355   BUN 15 02/26/2015 1231   BUN 16.9 12/16/2014 1535   CREATININE 0.90 02/26/2015 1231   CREATININE 0.9 12/16/2014 1535   CALCIUM 10.0 12/16/2014 1535   CALCIUM 9.7 06/12/2014 2040   PROT 6.9 12/16/2014 1535   PROT 8.4* 06/12/2014 2040   ALBUMIN 3.9 12/16/2014 1535   ALBUMIN 4.3 06/12/2014 2040   AST 24 12/16/2014 1535   AST 38* 06/12/2014 2040   ALT 18 12/16/2014 1535   ALT 15 06/12/2014 2040   ALKPHOS 115 12/16/2014 1535   ALKPHOS 106 06/12/2014 2040   BILITOT <0.20 12/16/2014 1535   BILITOT 0.4 06/12/2014 2040   GFRNONAA 80* 06/12/2014 2040   GFRAA >90 06/12/2014 2040    INo results found for: SPEP, UPEP  Lab Results  Component Value Date   WBC 3.7* 12/16/2014   NEUTROABS 1.6 12/16/2014   HGB 15.3* 02/26/2015   HCT 45.0 02/26/2015   MCV 90.8 12/16/2014   PLT 208 12/16/2014      Chemistry      Component Value Date/Time   NA 143 02/26/2015 1231   NA 142 12/16/2014 1535   K 3.6 02/26/2015 1231   K 4.0 12/16/2014 1535   CL 105 02/26/2015 1231   CL 106 02/21/2013 1355   CO2 26 12/16/2014 1535   CO2 24 06/12/2014 2040   BUN 15 02/26/2015 1231   BUN 16.9 12/16/2014 1535   CREATININE 0.90 02/26/2015 1231   CREATININE 0.9 12/16/2014 1535      Component Value Date/Time   CALCIUM 10.0 12/16/2014 1535   CALCIUM 9.7 06/12/2014 2040   ALKPHOS 115 12/16/2014 1535   ALKPHOS 106 06/12/2014 2040   AST 24 12/16/2014 1535   AST 38* 06/12/2014 2040   ALT 18 12/16/2014 1535   ALT 15 06/12/2014 2040   BILITOT <0.20 12/16/2014 1535   BILITOT 0.4 06/12/2014 2040       No results found for: LABCA2  No components found for:  LABCA125  No results for input(s): INR in the last 168 hours.  Urinalysis  Component Value Date/Time   COLORURINE YELLOW 06/12/2014 2231   APPEARANCEUR CLEAR 06/12/2014 2231   LABSPEC 1.007 06/12/2014 2231   PHURINE 6.5 06/12/2014 2231   GLUCOSEU NEGATIVE 06/12/2014 2231   HGBUR NEGATIVE 06/12/2014 2231   BILIRUBINUR NEGATIVE 06/12/2014 2231   KETONESUR NEGATIVE 06/12/2014 2231   PROTEINUR NEGATIVE 06/12/2014 2231   UROBILINOGEN 0.2 06/12/2014 2231   NITRITE NEGATIVE 06/12/2014 2231   LEUKOCYTESUR NEGATIVE 06/12/2014 2231    STUDIES: Nm Sentinel Node Inj-no Rpt (breast)  02/27/2015   CLINICAL DATA: left breast cancer   Sulfur colloid was injected intradermally by the nuclear medicine  technologist for breast cancer sentinel node localization.    Mm Breast Surgical Specimen  02/27/2015   CLINICAL DATA:  Status post left lumpectomy.  EXAM: SPECIMEN RADIOGRAPH OF THE LEFT BREAST  COMPARISON:  Previous exam(s).  FINDINGS: Status post excision of the left breast. The radioactive seed and biopsy marker clip are present, completely intact, and were marked for pathology. These findings were discussed by telephone while the patient was still in the operating room.  IMPRESSION: Specimen radiograph of the left breast.   Electronically Signed   By: Andres Shad   On: 02/27/2015 11:37   Mm Lt Radioactive Seed Loc Mammo Guide  02/26/2015   CLINICAL DATA:  Recent diagnosis of left breast cancer. Patient presents for radioactive seed localization prior to lumpectomy.  EXAM: MAMMOGRAPHIC GUIDED RADIOACTIVE SEED LOCALIZATION OF THE LEFT BREAST  COMPARISON:  Previous exam(s).  FINDINGS: Patient presents for radioactive seed localization prior to lumpectomy. I met with the patient and we discussed the procedure of seed localization including benefits and alternatives. We discussed the high likelihood of a successful procedure. We discussed the risks of the procedure including infection, bleeding, tissue  injury and further surgery. We discussed the low dose of radioactivity involved in the procedure. Informed, written consent was given.  The usual time-out protocol was performed immediately prior to the procedure.  Using mammographic guidance, sterile technique, 2% lidocaine and an I-125 radioactive seed, a mass and associated ribbon shaped biopsy clip was localized using a lateral to medial approach. The follow-up mammogram images confirm the seed in the expected location and were marked for Dr. Marlou Starks.  Follow-up survey of the patient confirms presence of the radioactive seed.  Order number of I-125 seed:  [id number].  Total activity:  0.246 mCi  Reference Date: Feb 04, 2015  The patient tolerated the procedure well and was released from the [facility]. She was given instructions regarding seed removal.  IMPRESSION: Radioactive seed localization left breast. No apparent complications.   Electronically Signed   By: Curlene Dolphin M.D.   On: 02/26/2015 15:34    ASSESSMENT: 81 y.o. [place] woman status post left breast biopsy 01/23/2015 for a clinical T1b N0, stage IA invasive ductal carcinoma, grade 3, estrogen and progesterone receptor negative, but HER-2 amplified, with an MIB-1 of 79%  (1) genetics testing requested--patient with breast cancer, family history of ovarian cancer  (2) left lumpectomy with sentinel lymph node biopsy on 02/27/2015  (3) adjuvant anti-HER-2 immunotherapy with trastuzumab every 3 weeks through 1 year, to start 03/28/15. Patient declined chemotherapy (abraxane).  (4) radiation to follow as appropriate  PLAN: Rossy, her daughter and I discussed the merits and draw back to both chemotherapy and immunotherapy. At this time, Aunica is rightfully concerned about her functional status during the sometimes rigorous trials of chemotherapy, but she was interested in trastuzumab as an immunotherapy option. She can receive this  drug peripherally, eliminating the need for additional  surgery for a port. This plan was discussed with Dr. Jana Hakim, and he agrees.   We reviewed potential side effects and toxicities of this drug. She already has a baseline echocardiogram that was performed in June.   Referral sent to Dr. Pablo Ledger to discuss the need for possible radiation, given her age. At this point the patient is reluctant, but she is willing to follow whatever recommendation Dr. Pablo Ledger ends up giving.  Lynsee will begin trastuzumab alone every 3 weeks starting next week. She will return for labs and a follow up visit prior to the start of cycle 2. She understands and agree with this plan. She knows the goal of treatment in her case is cure. She has been encouraged to call with any issues that might arise before her next visit here.   Laurie Panda, NP   03/21/2015 4:39 PM

## 2015-03-21 NOTE — Telephone Encounter (Signed)
Gave and printed appt sched and avs for pt for July and Aug

## 2015-03-26 ENCOUNTER — Telehealth: Payer: Self-pay | Admitting: *Deleted

## 2015-03-26 NOTE — Telephone Encounter (Signed)
Per Dr. Jana Hakim, I informed West Carbo, daughter, that it's OK to take [place] and Herceptin together. Pearl verbalized understanding.

## 2015-03-26 NOTE — Telephone Encounter (Signed)
VM message received from pt's daughter, West Carbo. She states patient takes [place] for her CML. Pearl wants to know if it still ok for her mother to take the [place] when she is on the Herceptin, which she starts on Friday, 03/28/15. Please Advise.

## 2015-03-28 ENCOUNTER — Ambulatory Visit (HOSPITAL_BASED_OUTPATIENT_CLINIC_OR_DEPARTMENT_OTHER): Payer: Medicare Other

## 2015-03-28 ENCOUNTER — Other Ambulatory Visit: Payer: Self-pay | Admitting: Hematology and Oncology

## 2015-03-28 ENCOUNTER — Other Ambulatory Visit (HOSPITAL_BASED_OUTPATIENT_CLINIC_OR_DEPARTMENT_OTHER): Payer: Medicare Other

## 2015-03-28 VITALS — BP 139/49 | HR 57 | Temp 98.2°F | Resp 18

## 2015-03-28 DIAGNOSIS — C50912 Malignant neoplasm of unspecified site of left female breast: Secondary | ICD-10-CM | POA: Diagnosis not present

## 2015-03-28 DIAGNOSIS — Z5112 Encounter for antineoplastic immunotherapy: Secondary | ICD-10-CM

## 2015-03-28 DIAGNOSIS — C9210 Chronic myeloid leukemia, BCR/ABL-positive, not having achieved remission: Secondary | ICD-10-CM

## 2015-03-28 LAB — COMPREHENSIVE METABOLIC PANEL (CC13)
ALBUMIN: 3.7 g/dL (ref 3.5–5.0)
ALT: 15 U/L (ref 0–55)
AST: 23 U/L (ref 5–34)
Alkaline Phosphatase: 92 U/L (ref 40–150)
Anion Gap: 6 mEq/L (ref 3–11)
BUN: 11.5 mg/dL (ref 7.0–26.0)
CALCIUM: 9.7 mg/dL (ref 8.4–10.4)
CO2: 26 mEq/L (ref 22–29)
Chloride: 107 mEq/L (ref 98–109)
Creatinine: 0.9 mg/dL (ref 0.6–1.1)
EGFR: 73 mL/min/{1.73_m2} — ABNORMAL LOW (ref >90)
GLUCOSE: 98 mg/dL (ref 70–140)
POTASSIUM: 3.6 meq/L (ref 3.5–5.1)
Sodium: 139 mEq/L (ref 136–145)
Total Bilirubin: 0.28 mg/dL (ref 0.20–1.20)
Total Protein: 6.7 g/dL (ref 6.4–8.3)

## 2015-03-28 LAB — CBC WITH DIFFERENTIAL/PLATELET
BASO%: 0.8 % (ref 0.0–2.0)
Basophils Absolute: 0 10*3/uL (ref 0.0–0.1)
EOS%: 1.7 % (ref 0.0–7.0)
Eosinophils Absolute: 0.1 10*3/uL (ref 0.0–0.5)
HCT: 35 % (ref 34.8–46.6)
HGB: 11.4 g/dL — ABNORMAL LOW (ref 11.6–15.9)
LYMPH%: 38.8 % (ref 14.0–49.7)
MCH: 29.3 pg (ref 25.1–34.0)
MCHC: 32.5 g/dL (ref 31.5–36.0)
MCV: 90.1 fL (ref 79.5–101.0)
MONO#: 0.2 10*3/uL (ref 0.1–0.9)
MONO%: 6.8 % (ref 0.0–14.0)
NEUT%: 51.9 % (ref 38.4–76.8)
NEUTROS ABS: 1.6 10*3/uL (ref 1.5–6.5)
Platelets: 262 10*3/uL (ref 145–400)
RBC: 3.89 10*6/uL (ref 3.70–5.45)
RDW: 14.8 % — ABNORMAL HIGH (ref 11.2–14.5)
WBC: 3 10*3/uL — ABNORMAL LOW (ref 3.9–10.3)
lymph#: 1.2 10*3/uL (ref 0.9–3.3)

## 2015-03-28 LAB — LACTATE DEHYDROGENASE (CC13): LDH: 247 U/L — ABNORMAL HIGH (ref 125–245)

## 2015-03-28 MED ORDER — SODIUM CHLORIDE 0.9 % IV SOLN
Freq: Once | INTRAVENOUS | Status: AC
  Administered 2015-03-28: 15:00:00 via INTRAVENOUS

## 2015-03-28 MED ORDER — ACETAMINOPHEN 325 MG PO TABS
650.0000 mg | ORAL_TABLET | Freq: Once | ORAL | Status: AC
Start: 2015-03-28 — End: 2015-03-28
  Administered 2015-03-28: 650 mg via ORAL

## 2015-03-28 MED ORDER — DIPHENHYDRAMINE HCL 25 MG PO CAPS
25.0000 mg | ORAL_CAPSULE | Freq: Once | ORAL | Status: AC
  Administered 2015-03-28: 25 mg via ORAL

## 2015-03-28 MED ORDER — DIPHENHYDRAMINE HCL 25 MG PO CAPS
ORAL_CAPSULE | ORAL | Status: AC
  Filled 2015-03-28: qty 1

## 2015-03-28 MED ORDER — ACETAMINOPHEN 325 MG PO TABS
ORAL_TABLET | ORAL | Status: AC
  Filled 2015-03-28: qty 2

## 2015-03-28 MED ORDER — TRASTUZUMAB CHEMO INJECTION 440 MG
8.0000 mg/kg | Freq: Once | INTRAVENOUS | Status: AC
  Administered 2015-03-28: 504 mg via INTRAVENOUS
  Filled 2015-03-28: qty 24

## 2015-03-28 NOTE — Patient Instructions (Signed)
[facility] Discharge Instructions for Patients Receiving Chemotherapy  Today you received the following chemotherapy agents: Herceptin   To help prevent nausea and vomiting after your treatment, we encourage you to take your nausea medication as directed.    If you develop nausea and vomiting that is not controlled by your nausea medication, call the clinic.   BELOW ARE SYMPTOMS THAT SHOULD BE REPORTED IMMEDIATELY:  *FEVER GREATER THAN 100.5 F  *CHILLS WITH OR WITHOUT FEVER  NAUSEA AND VOMITING THAT IS NOT CONTROLLED WITH YOUR NAUSEA MEDICATION  *UNUSUAL SHORTNESS OF BREATH  *UNUSUAL BRUISING OR BLEEDING  TENDERNESS IN MOUTH AND THROAT WITH OR WITHOUT PRESENCE OF ULCERS  *URINARY PROBLEMS  *BOWEL PROBLEMS  UNUSUAL RASH Items with * indicate a potential emergency and should be followed up as soon as possible.  Feel free to call the clinic you have any questions or concerns. The clinic phone number is [phone].  Please show the CHEMO ALERT CARD at check-in to the Emergency Department and triage nurse.   

## 2015-03-31 ENCOUNTER — Telehealth: Payer: Self-pay | Admitting: *Deleted

## 2015-03-31 NOTE — Progress Notes (Signed)
Location of Breast Cancer:Left invasive ductal carcinoma  Histology per Pathology Report: 02/27/15 Enid Cutter MD Pathologist, Electronic Signature ( Signed 03/04/2015) FINAL DIAGNOSIS Diagnosis 1. Breast, lumpectomy, left - INVASIVE DUCTAL CARCINOMA, GRADE 3/3, SPANNING 1.0 CM. - LYMPHOVASCULAR INVASION IS IDENTIFIED. - THE SURGICAL RESECTION MARGINS ARE NEGATIVE FOR CARCINOMA. - SEE ONCOLOGY TABLE BELOW. 1 of 4 FINAL for EMERI, ESTILL ([id number]) Diagnosis(continued) 2. Lymph node, sentinel, biopsy, left axillary #1 - THERE IS NO EVIDENCE OF CARCINOMA IN 1 OF 1 LYMPH NODE (0/1). 3. Lymph node, sentinel, biopsy, left axillary #2 - THERE IS NO EVIDENCE OF CARCINOMA IN 1 OF 1 LYMPH NODE (0/1). 4. Lymph node, sentinel, biopsy, left axillary #3 - THERE IS NO EVIDENCE OF CARCINOMA IN 1 OF 1 LYMPH NODE (0/1). 5. Lymph node, sentinel, biopsy, left axillary #4 - THERE IS NO EVIDENCE OF CARCINOMA IN 1 OF 1 LYMPH NODE (0/1).  Receptor Status: ER(-), PR (-), Her2-neu (+)  Did patient present with symptoms (if so, please note symptoms) or was this found on screening mammography?:   Past/Anticipated interventions by surgeon, if any:02/27/15 BREAST LUMPECTOMY WITH RADIOACTIVE SEED AND SENTINEL LYMPH NODE BIOPSY  Past/Anticipated interventions by medical oncology, if any: Chemotherapy:trastuzumab  Every 3 weeks.  Lymphedema issues, if any:  Pain issues, if BVA:POLI arm pain.  SAFETY ISSUES:  Prior radiation? No  Pacemaker/ICD? No  Possible current pregnancy?Hysterectomy  Is the patient on methotrexate? No  Current Complaints / other details: Widowed in 2001. Menarche age 81, first live birth age 3.GXP5 (4 sons, 1 daughter) 2 sons are deceased.No hormone replacement therapy Rambling speech on aricept. Diagnosed with CLL in 2010, on gleevac. Allergies:ace inhibitors,hyzaar,lipitor, losartan and tiazac No smoking history    Arlyss Repress, RN 03/31/2015,4:09 PM  BP  136/54 mmHg  Pulse 52  Temp(Src) 98.5 F (36.9 C)  Wt 137 lb 1.6 oz (62.188 kg)  Advanced Directive:Pearle Valetta Mole (daughter) NOt signed?

## 2015-03-31 NOTE — Telephone Encounter (Signed)
-----  Message from Ronnette Juniper, RN sent at 03/28/2015  5:14 PM EDT ----- Regarding: 1st treatment 1st treatment: Herceptin.  [place]  [phone]

## 2015-03-31 NOTE — Telephone Encounter (Signed)
This RN called pt per chemo follow up per first time Herceptin.  Pamela House states " I feel better since getting the medication " " I thought it was going to be much worse but I actually enjoyed being there "  Pt denies any concerns since receiving the medication.  Sanaiya understands to call if concerns arise.

## 2015-04-02 ENCOUNTER — Encounter: Payer: Self-pay | Admitting: Radiation Oncology

## 2015-04-02 ENCOUNTER — Ambulatory Visit
Admission: RE | Admit: 2015-04-02 | Discharge: 2015-04-02 | Disposition: A | Discharge status: Home / Self Care | Payer: Medicare Other | Source: Ambulatory Visit | Attending: Radiation Oncology | Admitting: Radiation Oncology

## 2015-04-02 VITALS — BP 136/54 | HR 52 | Temp 98.5°F | Wt 137.1 lb

## 2015-04-02 DIAGNOSIS — C50912 Malignant neoplasm of unspecified site of left female breast: Secondary | ICD-10-CM

## 2015-04-02 DIAGNOSIS — C50412 Malignant neoplasm of upper-outer quadrant of left female breast: Secondary | ICD-10-CM | POA: Diagnosis not present

## 2015-04-02 DIAGNOSIS — Z171 Estrogen receptor negative status [ER-]: Secondary | ICD-10-CM | POA: Insufficient documentation

## 2015-04-02 DIAGNOSIS — C9211 Chronic myeloid leukemia, BCR/ABL-positive, in remission: Secondary | ICD-10-CM | POA: Diagnosis not present

## 2015-04-02 DIAGNOSIS — Z510 Encounter for antineoplastic radiation therapy: Secondary | ICD-10-CM | POA: Insufficient documentation

## 2015-04-02 NOTE — Progress Notes (Signed)
Radiation Oncology         [phone] ________________________________  Initial Outpatient Consultation - Date: 04/02/2015   Name: Pamela House MRN: [id number]   DOB: 22-Jul-1930  REFERRING PHYSICIAN: Jovita Kussmaul, MD  DIAGNOSIS AND STAGE: Breast cancer, left breast   Staging form: Breast, AJCC 7th Edition     Clinical: Stage IA (T1b, N0, M0) - Signed by Chauncey Cruel, MD on 02/07/2015   HISTORY OF PRESENT ILLNESS:Pamela House is a 81 y.o. female presenting to clinic in regards to her T1bN0 Stage I cancer of the left breast. She has a history of chronic myeloid lukemia, followed by Dr. Julien Nordmann. She is in remission according to her most recent lab tests. In April of this year, she underwent a screening mammogram. A mass in the left breast was discovered, measuring 18m in the upper outer quadrant. An ultrasound of the left axillary was negative. A biopsy was performed on 01/23/2015 which showed a Grade III invasive ductal carcinoma. This was recorded as ER negative, PR negative, HER2 positive, and KI67 was 79%. She elected for breast conservation and had a lumpectomy with sentinel lymph biopsy on 02/27/2015. This showed 1.0cm invasive ductal carcinoma with lympovascual invasion, which was triple negative. Four sentinel lymph nodes were negative and margins were also negative. She has discussed chemotherapy with Dr. MJana Hakimand will be undergoing Herception alone. She is referred to me for discussion of the role of radiation in management of this newly diagnosed cancer. MWynellvocalized that many members of her family have lived to be over 169years of age. She also reported that the steroids helped her manage symptoms of chemotherapy, last Friday. "I rested real good like a baby," MAlyxisstated when asked how she slept that night. "I take Ensure, because sometimes I am so weak," she shared and also stated that she currently lives alone. She has a history of diarrhea since receiving treatment  that has inturrupted normal life functions, such as making it to appointments, but currently has access to medications to help manage it. She has some cording of her left arm and pain which she is seeing physical therapy for tomorrow.   PREVIOUS RADIATION THERAPY: No  Past medical, social and family history were reviewed in the electronic chart. Review of symptoms was reviewed in the electronic chart. Medications were reviewed in the electronic chart.   PHYSICAL EXAM:  Filed Vitals:   04/02/15 1505  BP: 136/54  Pulse: 52  Temp: 98.5 F (36.9 C)  She is alert and oriented X3. She rambles a bit and seem to be somewhat disoriented occasionally with her daughter redirectiong.  She has excellent range of motion of her arm and no lymphedema present.  Her scar is healing well.   IMPRESSION: Pamela Bizzarrois a 81year old female presenting to clinic in regards to her T1bN0 Stage I cancer of the left female breast.  PLAN: I spoke to the patient today regarding her diagnosis and options for treatment. We discussed the equivalence in terms of survival and local failure between mastectomy and breast conservation. We discussed the role of radiation in decreasing local failures in patients who undergo lumpectomy. We discussed the process of simulation and the placement tattoos. We discussed 4 weeks of treatment as an outpatient. We discussed the possibility of asymptomatic lung damage. We discussed the low likelihood of secondary malignancies. We discussed the possible side effects including but not limited to skin redness, fatigue, permanent skin darkening, and breast  swelling.  We specifically discussed the differences between chemotherapy and radiation therapy with [place], as she was originally confused about the difference. We also discussed what to typically expect from the radiation process, common side effects to expect, and the long term benefits of currently seeking radiation treatments in relation to  treating her breast cancer. Pamela House is currently seeing Dr. Magrinat for chemotherapy treatments, every three weeks. If she still wishes to stop taking the Gleevec medication as instructed, she was advised to discuss this request with Dr. Mohammad. Physical therapy was recommended for symptoms she is experiencing in her arm, before receiving radiation therapy. She was advised of the benefits of physical therapy and the importance of completing physical therapy before participating in radiation treatments. Pamela House was advised of her CT scan and planning session to take place on August 9th, 2016 at 3:30pm. Treatment will be scheduled to follow the second week of August. Information was provided in regards to these appointments. If she develops any further questions or concerns in regards to her treatment, she was encouraged to contact me. She will talk with Meredith, one of the hospital's financial counselors over her bill worries and financial issues, expressed during today's appointment.  She signed informed consent and agreed to proceed forward.   I spent 40 minutes  face to face with the patient and more than 50% of that time was spent in counseling and/or coordination of care.   This document serves as a record of services personally performed by   , MD. It was created on her behalf by Stephanie Masullo, a trained medical scribe. The creation of this record is based on the scribe's personal observations and the provider's statements to them. This document has been checked and approved by the attending provider.  _______________________________________   , MD   

## 2015-04-03 ENCOUNTER — Ambulatory Visit: Payer: Medicare Other | Attending: General Surgery | Admitting: Physical Therapy

## 2015-04-03 ENCOUNTER — Encounter: Payer: Self-pay | Admitting: Physical Therapy

## 2015-04-03 DIAGNOSIS — M25612 Stiffness of left shoulder, not elsewhere classified: Secondary | ICD-10-CM

## 2015-04-03 DIAGNOSIS — Z9889 Other specified postprocedural states: Secondary | ICD-10-CM | POA: Diagnosis not present

## 2015-04-03 DIAGNOSIS — R293 Abnormal posture: Secondary | ICD-10-CM

## 2015-04-03 NOTE — Patient Instructions (Signed)
Flexion (Eccentric) - Active (Cane)   Lift cane with both hands. Avoid hiking shoulders. Lower cane slowly for 3-5 seconds. __10_ reps per set, __2_ sets per day, _7__ days per week.  Copyright  VHI. All rights reserved.   Copyright  VHI. All rights reserved.  Cane Exercise: Abduction   Hold cane with right hand over end, palm-up, with other hand palm-down. Move arm out from side and up by pushing with other arm. Hold _2___ seconds. Repeat __10__ times. Do __2 __ sessions per day.  External Rotation   Stand with hands clasped behind head. Pull elbows back as far as possible. Hold __5__ seconds. Repeat __5__ times. Do _2___ sessions per day.  Copyright  VHI. All rights reserved.   http://gt2.exer.us/82   Copyright  VHI. All rights reserved.

## 2015-04-03 NOTE — Therapy (Addendum)
[place], [place], [address] Phone: [phone]   Fax:  [phone]  Physical Therapy Evaluation  Patient Details  Name: Pamela House MRN: [id number] Date of Birth: 11-Jun-1930 Referring Provider:  Jovita Kussmaul, MD  Encounter Date: 04/03/2015      PT End of Session - 04/03/15 1556    Visit Number 1   Number of Visits 8   Date for PT Re-Evaluation 05/01/15   PT Start Time 1017   PT Stop Time 1600   PT Time Calculation (min) 45 min   Activity Tolerance Patient tolerated treatment well   Behavior During Therapy [facility] for tasks assessed/performed      Past Medical History  Diagnosis Date  . Dyslipidemia   . GERD (gastroesophageal reflux disease)   . Dementia   . Anxiety   . Allergic rhinitis   . Hypertension   . DDD (degenerative disc disease)   . Osteoporosis   . Vitamin D deficiency   . Leukemia 2010    Past Surgical History  Procedure Laterality Date  . Back surgery  1984  . Breast lumpectomy with radioactive seed and sentinel lymph node biopsy Left 02/27/2015    Procedure: LEFT BREAST LUMPECTOMY WITH RADIOACTIVE SEED AND SENTINEL LYMPH NODE BIOPSY;  Surgeon: Autumn Messing III, MD;  Location: [facility];  Service: General;  Laterality: Left;    There were no vitals filed for this visit.  Visit Diagnosis:  Status post left breast lumpectomy - Plan: PT plan of care cert/re-cert  Abnormal posture - Plan: PT plan of care cert/re-cert  Shoulder stiffness, left - Plan: PT plan of care cert/re-cert      Subjective Assessment - 04/03/15 1611    Subjective Patient reports she underwent left lumpectomy with SLNB (4 nodes removed and all were negative).  She reports tightness and discomfort under her left arm.   Pertinent History Left lumpectomy and SLNB 02/27/15 with 4 nodes removed (all negative) and started chemotherapy 03/28/15 for ER/PR negative, HER2 positive breast cancer on  the left side.  She met with Dr. Pablo Ledger and is going to undergo radiation but there were concerns about being able to raise her arm to tolerate radiation.  She also sees Dr. Julien Nordmann for chronic myeloid leukemia.            [facility] PT Assessment - 04/03/15 0001    Assessment   Medical Diagnosis s/p left lumpectomy with SLNB   Onset Date/Surgical Date 02/27/15   Hand Dominance Right   Next MD Visit 04/18/15   Prior Therapy no   Precautions   Precautions Fall;Other (comment)  Active cancer   Restrictions   Weight Bearing Restrictions No   Balance Screen   Has the patient fallen in the past 6 months No   Has the patient had a decrease in activity level because of a fear of falling?  No   Is the patient reluctant to leave their home because of a fear of falling?  No   Home Environment   Living Environment Private residence   Living Arrangements Alone   Available Help at Discharge Family   Type of [place] Level entry   Prior Function   Level of Monument device for independence  Uses SPC for ambulation   Vocation Retired   Leisure She does not exercise   Cognition   Overall Cognitive Status Within Functional Limits for tasks assessed  Posture/Postural Control   Posture/Postural Control Postural limitations   Postural Limitations Rounded Shoulders;Forward head   ROM / Strength   AROM / PROM / Strength AROM   AROM   AROM Assessment Site Shoulder   Right/Left Shoulder Right;Left   Right Shoulder Extension 60 Degrees   Right Shoulder Flexion 143 Degrees   Right Shoulder ABduction 158 Degrees   Right Shoulder Internal Rotation 78 Degrees   Right Shoulder External Rotation 70 Degrees   Left Shoulder Extension 60 Degrees   Left Shoulder Flexion 140 Degrees   Left Shoulder ABduction 136 Degrees   Left Shoulder Internal Rotation 83 Degrees   Left Shoulder External Rotation 80 Degrees   Palpation   Palpation comment  Visible and palpable cording (2 cords) present in left axillary area.  Cords primarily evident in axilla but go down into medial upper arm slightly.           LYMPHEDEMA/ONCOLOGY QUESTIONNAIRE - 04/03/15 1541    Type   Cancer Type Left breast cancer   Surgeries   Lumpectomy Date 02/27/15   Sentinel Lymph Node Biopsy Date 02/27/15   Number Lymph Nodes Removed 4   Treatment   Active Chemotherapy Treatment Yes   Date 03/28/15   Past Chemotherapy Treatment Yes   Date 09/13/08  for chronic myeloid leukemia   Active Radiation Treatment No   Past Radiation Treatment No   Current Hormone Treatment No   Past Hormone Therapy No   What other symptoms do you have   Are you Having Heaviness or Tightness No   Are you having Pain No   Are you having pitting edema No   Is it Hard or Difficult finding clothes that fit No   Do you have infections No   Is there Decreased scar mobility No   Stemmer Sign No   Other Symptoms --   Lymphedema Assessments   Lymphedema Assessments Upper extremities   Right Upper Extremity Lymphedema   10 cm Proximal to Olecranon Process 28.7 cm   Olecranon Process 21.5 cm   10 cm Proximal to Ulnar Styloid Process 18.8 cm   Just Proximal to Ulnar Styloid Process 14.3 cm   Across Hand at [employer] 17.5 cm   At [place] of 2nd Digit 6.4 cm   Left Upper Extremity Lymphedema   10 cm Proximal to Olecranon Process 29 cm   Olecranon Process 22.1 cm   10 cm Proximal to Ulnar Styloid Process 18.9 cm   Just Proximal to Ulnar Styloid Process 14 cm   Across Hand at [employer] 17.1 cm   At [place] of 2nd Digit 6.1 cm           Quick Dash - 04/03/15 0001    Open a tight or new jar Unable   Do heavy household chores (wash walls, wash floors) Mild difficulty   Carry a shopping bag or briefcase Mild difficulty   Wash your back Mild difficulty   Use a knife to cut food Mild difficulty   Recreational activities in which you take some force or impact through your  arm, shoulder, or hand (golf, hammering, tennis) Unable   During the past week, to what extent has your arm, shoulder or hand problem interfered with your normal social activities with family, friends, neighbors, or groups? Modererately   During the past week, to what extent has your arm, shoulder or hand problem limited your work or other regular daily activities Modererately   Arm, shoulder, or hand  pain. Moderate   Tingling (pins and needles) in your arm, shoulder, or hand Mild   Difficulty Sleeping Moderate difficulty   DASH Score 47.73 %                     PT Education - 2015/04/18 1554    Education provided Yes   Education Details Shoulder ROM HEP   Person(s) Educated Patient   Methods Explanation;Demonstration;Handout   Comprehension Verbalized understanding;Returned demonstration                [facility] Goals - 04/18/15 1616    CC Long Term Goal  #1   Title Patient will be able to demonstrate safe and proper technique with home exercise program to increase left shoulder ROM   Time 4   Period Weeks   Status New   CC Long Term Goal  #2   Title Patient will be able to report >/= 25% improvement in her left axillary cording tightness and discomfort   Time 4   Period Weeks   Status New   CC Long Term Goal  #3   Title Patient will be able to tolerate radiation positioning with left shoulder abduction at >/= 140 degrees and full external rotation   Time 4   Period Weeks   Status New   CC Long Term Goal  #4   Title Patient will be able to verbalize understanding of basic lymphedema risk reduction practices   Time 4   Period Weeks   Status New            Plan - Apr 18, 2015 1611    Clinical Impression Statement Patient underwent a left lumpectomy and SLNB 02/27/15 with 4 nodes removed (all negative) and started chemotherapy 03/28/15 for ER/PR negative, HER2 positive breast cancer on the left side.  She met with Dr. Pablo Ledger 04/02/15 and is going to  undergo radiation but there were concerns about being able to raise her arm to tolerate radiation.  She also sees Dr. Julien Nordmann for chronic myeloid leukemia.  When assessed today, she demonstrated very good active left shoulder range of motion but had visible and palpable cording present in her left axilla.  She expressed concerns about being able to attend physical therapy due to transportation issues and other appointments so has only agreed to 1 more appointment for right now but may change her mind after she finds out her radiation schedule.  Her primary issue to be addressed is her axillary cording and should respond well to manual techniques.   Pt will benefit from skilled therapeutic intervention in order to improve on the following deficits Impaired UE functional use;Decreased range of motion;Decreased knowledge of precautions   Rehab Potential Good   Clinical Impairments Affecting Rehab Potential Patient with comorbidities and weakness due to chronic myeloid leukemia   PT Frequency 2x / week   PT Duration 4 weeks   PT Treatment/Interventions ADLs/Self Care Home Management;Therapeutic exercise;Manual techniques;Manual lymph drainage;Patient/family education   PT Next Visit Plan Manual techniques to reduce left axillary cording   PT Home Exercise Plan Shoulder ROM HEP given today   Consulted and Agree with Plan of Care Patient;Family member/caregiver   Family Member Consulted daughter Hart [place] - 04/18/2015 1619    Functional Assessment Tool Used Quick DASH   Functional Limitation Carrying, moving and handling objects   Carrying, Moving and Handling Objects Current Status [phone]) At least 40 percent but less  than 60 percent impaired, limited or restricted   Carrying, Moving and Handling Objects Goal Status [phone]) At least 20 percent but less than 40 percent impaired, limited or restricted       Problem List Patient Active Problem List   Diagnosis Date Noted  . Breast  cancer, left breast 02/07/2015  . Chronic cough 06/19/2013  . ILD (interstitial lung disease) 06/19/2013  . Hypertension 02/21/2013  . CML (chronic myelocytic leukemia) 05/23/2012    Annia Friendly, PT 04/03/2015, 4:21 PM  [place], [place], [address] Phone: [phone]   Fax:  [phone]   PHYSICAL THERAPY DISCHARGE SUMMARY  Visits from Start of Care: 1  Current functional level related to goals / functional outcomes: Unknown; patient did not return for additional visits due to transportation concerns and being in radiation treatment.   Remaining deficits: Unknown   Education / Equipment: None Plan: Patient agrees to discharge.  Patient goals were not met. Patient is being discharged due to not returning since the last visit.  ?????       Annia Friendly, [place] 09/03/2015 8:48 AM

## 2015-04-04 ENCOUNTER — Encounter: Payer: Self-pay | Admitting: *Deleted
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
## 2015-04-08 ENCOUNTER — Ambulatory Visit: Payer: Medicare Other | Admitting: Physical Therapy

## 2015-04-18 ENCOUNTER — Encounter: Payer: Self-pay | Admitting: Nurse Practitioner

## 2015-04-18 ENCOUNTER — Ambulatory Visit (HOSPITAL_BASED_OUTPATIENT_CLINIC_OR_DEPARTMENT_OTHER): Payer: Medicare Other

## 2015-04-18 ENCOUNTER — Other Ambulatory Visit (HOSPITAL_BASED_OUTPATIENT_CLINIC_OR_DEPARTMENT_OTHER): Payer: Medicare Other

## 2015-04-18 ENCOUNTER — Telehealth: Payer: Self-pay | Admitting: Nurse Practitioner

## 2015-04-18 ENCOUNTER — Ambulatory Visit (HOSPITAL_BASED_OUTPATIENT_CLINIC_OR_DEPARTMENT_OTHER): Payer: Medicare Other | Admitting: Nurse Practitioner

## 2015-04-18 VITALS — BP 150/67 | HR 58 | Temp 97.5°F | Resp 18 | Ht 63.0 in | Wt 135.4 lb

## 2015-04-18 DIAGNOSIS — R32 Unspecified urinary incontinence: Secondary | ICD-10-CM | POA: Diagnosis not present

## 2015-04-18 DIAGNOSIS — Z5112 Encounter for antineoplastic immunotherapy: Secondary | ICD-10-CM

## 2015-04-18 DIAGNOSIS — C50912 Malignant neoplasm of unspecified site of left female breast: Secondary | ICD-10-CM | POA: Diagnosis not present

## 2015-04-18 DIAGNOSIS — C9210 Chronic myeloid leukemia, BCR/ABL-positive, not having achieved remission: Secondary | ICD-10-CM

## 2015-04-18 LAB — COMPREHENSIVE METABOLIC PANEL (CC13)
ALT: 15 U/L (ref 0–55)
ANION GAP: 5 meq/L (ref 3–11)
AST: 18 U/L (ref 5–34)
Albumin: 3.8 g/dL (ref 3.5–5.0)
Alkaline Phosphatase: 114 U/L (ref 40–150)
BUN: 23.8 mg/dL (ref 7.0–26.0)
CHLORIDE: 111 meq/L — AB (ref 98–109)
CO2: 28 mEq/L (ref 22–29)
Calcium: 9.3 mg/dL (ref 8.4–10.4)
Creatinine: 0.9 mg/dL (ref 0.6–1.1)
EGFR: 67 mL/min/{1.73_m2} — ABNORMAL LOW (ref >90)
Glucose: 80 mg/dl (ref 70–140)
Potassium: 4 mEq/L (ref 3.5–5.1)
Sodium: 144 mEq/L (ref 136–145)
Total Bilirubin: <0.2 mg/dL (ref 0.20–1.20)
Total Protein: 6.5 g/dL (ref 6.4–8.3)

## 2015-04-18 LAB — CBC WITH DIFFERENTIAL/PLATELET
BASO%: 1 % (ref 0.0–2.0)
BASOS ABS: 0 10*3/uL (ref 0.0–0.1)
EOS%: 2.3 % (ref 0.0–7.0)
Eosinophils Absolute: 0.1 10*3/uL (ref 0.0–0.5)
HCT: 33.8 % — ABNORMAL LOW (ref 34.8–46.6)
HGB: 11 g/dL — ABNORMAL LOW (ref 11.6–15.9)
LYMPH%: 40.4 % (ref 14.0–49.7)
MCH: 29.7 pg (ref 25.1–34.0)
MCHC: 32.4 g/dL (ref 31.5–36.0)
MCV: 91.6 fL (ref 79.5–101.0)
MONO#: 0.3 10*3/uL (ref 0.1–0.9)
MONO%: 9.3 % (ref 0.0–14.0)
NEUT#: 1.7 10*3/uL (ref 1.5–6.5)
NEUT%: 47 % (ref 38.4–76.8)
PLATELETS: 234 10*3/uL (ref 145–400)
RBC: 3.68 10*6/uL — ABNORMAL LOW (ref 3.70–5.45)
RDW: 15 % — AB (ref 11.2–14.5)
WBC: 3.7 10*3/uL — ABNORMAL LOW (ref 3.9–10.3)
lymph#: 1.5 10*3/uL (ref 0.9–3.3)

## 2015-04-18 MED ORDER — TRASTUZUMAB CHEMO INJECTION 440 MG
6.0000 mg/kg | Freq: Once | INTRAVENOUS | Status: AC
  Administered 2015-04-18: 378 mg via INTRAVENOUS
  Filled 2015-04-18: qty 18

## 2015-04-18 MED ORDER — DIPHENHYDRAMINE HCL 25 MG PO CAPS
25.0000 mg | ORAL_CAPSULE | Freq: Once | ORAL | Status: AC
  Administered 2015-04-18: 25 mg via ORAL

## 2015-04-18 MED ORDER — DIPHENHYDRAMINE HCL 25 MG PO CAPS
ORAL_CAPSULE | ORAL | Status: AC
  Filled 2015-04-18: qty 1

## 2015-04-18 MED ORDER — ACETAMINOPHEN 325 MG PO TABS
650.0000 mg | ORAL_TABLET | Freq: Once | ORAL | Status: AC
  Administered 2015-04-18: 650 mg via ORAL

## 2015-04-18 MED ORDER — DIPHENHYDRAMINE HCL 25 MG PO CAPS
ORAL_CAPSULE | ORAL | Status: AC
  Filled 2015-04-18: qty 2

## 2015-04-18 MED ORDER — ACETAMINOPHEN 325 MG PO TABS
ORAL_TABLET | ORAL | Status: AC
  Filled 2015-04-18: qty 2

## 2015-04-18 MED ORDER — SODIUM CHLORIDE 0.9 % IV SOLN
Freq: Once | INTRAVENOUS | Status: AC
  Administered 2015-04-18: 16:00:00 via INTRAVENOUS

## 2015-04-18 NOTE — Telephone Encounter (Signed)
Appointments made and avs printed for patient

## 2015-04-18 NOTE — Progress Notes (Signed)
[place]  [phone] [phone]     ID: Pamela House DOB: Jan 21, 1930  MR#: [id number]  OIZ#:[id number]  Patient Care Team: Pamela Low, MD as PCP - General (Internal Medicine) Pamela Cruel, MD as Consulting Physician (Oncology) Pamela Messing III, MD as Consulting Physician (General Surgery) PCP: Pamela Low, MD GYN: OTHER MD:  CHIEF COMPLAINT: HER-2 positive early stage breast cancer  CURRENT TREATMENT: anti-HER2 therapy   BREAST CANCER HISTORY: Pamela House has a history of chronic myeloid leukemia followed by Dr. Earlie House. She is treated with imatinib and her most recent BCR.ABL/ABL ratio was nearly unmeasurable.   On 12/25/2014 the patient had routine bilateral screening mammography at the breast Center, showing a breast density category B. There was a possible mass in the left breast and the patient was recalled for left diagnostic mammography with tomosynthesis and left breast ultrasonography 01/02/2015. This showed an 8 mm mass with indistinct margins in the posterior third of the outer left breast, which was palpable at the 2:30 position 8 cm from the nipple. Ultrasound confirmed an oval solid mass measuring 8 mm. Ultrasound of the left axilla showed no abnormal lymphadenopathy.  Biopsy of the left breast mass in question 01/23/2015 showed ([id number]) an invasive ductal carcinoma, grade 3, estrogen and progesterone receptor negative, but with HER-2 amplification, the signals ratio being 2.38, and the number per cell 3.45. MIB-1 was 79%.  The patient's case was presented at the multidisciplinary breast cancer conference 01/29/2015. At that time it was felt that breast conserving surgery would be the first step, to be followed by adjuvant chemotherapy and anti-HER-2 immunotherapy.  The patient's subsequent history is as detailed below  INTERVAL HISTORY:  Pamela House returns for follow up of her breast cancer, accompanied by her  daughter. She started trastuzumab after her last visit with [place]. She tolerated this well, and stated she felt better after it was over than she did before. She met with Dr. Pablo House recently and has agreed to begin radiation therapy as well.   REVIEW OF SYSTEMS:  Pamela House denies fevers or chills, nausea, or vomiting. She has chronic diarrhea and stress urinary incontinence. She takes probiotics daily. Her appetite is diminished and she has associated fatigue and weakness. She has chronic joint pains, especially to her left foot where she has a history of a contusion. She denies shortness of breath, chest pain, cough, or palpitations. She endorses anxiety but no depression. A detailed review of systems is otherwise stable.  PAST MEDICAL HISTORY: Past Medical History  Diagnosis Date  . Dyslipidemia   . GERD (gastroesophageal reflux disease)   . Dementia   . Anxiety   . Allergic rhinitis   . Hypertension   . DDD (degenerative disc disease)   . Osteoporosis   . Vitamin D deficiency   . Leukemia 2010    PAST SURGICAL HISTORY: Past Surgical History  Procedure Laterality Date  . Back surgery  1984  . Breast lumpectomy with radioactive seed and sentinel lymph node biopsy Left 02/27/2015    Procedure: LEFT BREAST LUMPECTOMY WITH RADIOACTIVE SEED AND SENTINEL LYMPH NODE BIOPSY;  Surgeon: Pamela Messing III, MD;  Location: [facility];  Service: General;  Laterality: Left;    FAMILY HISTORY No family history on file. The patient's father died at the age of 65. The patient's mother died in her 53Z from complications of diabetes. The patient had one full brother and one full sister there this fall sister had ovarian cancer diagnosed  late in life. The patient had 14 additional half siblings. There is no other history of breast cancer in the family.  GYNECOLOGIC HISTORY:  No LMP recorded. Patient has had a hysterectomy. Menarche age 40, first live birth age 69. She is GX P5. Pamela House is  not sure when she went through the change of life, but she had a hysterectomy remotely. She is not sure whether the ovaries were removed. She did not take hormone replacement.  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 Her husband died in 11/15/99 and she lives by herself, with no pets, in the [place] school apartments off some. There are exercise groups there but not a gym. Her daughter Pamela House is Agricultural consultant for a shipping company in [place]. Granddaughter Pamela House (pronounced "Trey Paula") is a Education officer, museum for the child protective services in [place]. The patient has 2 sons who have died. She attends a [employer].    ADVANCED DIRECTIVES: Her daughter Pamela House is her healthcare power of attorney. She can be reached at [phone].   HEALTH MAINTENANCE: History  Substance Use Topics  . Smoking status: Never Smoker   . Smokeless tobacco: Not on file  . Alcohol Use: No     Colonoscopy:  PAP:  Bone density:  Lipid panel:  Allergies  Allergen Reactions  . Ace Inhibitors Cough  . Hyzaar [Losartan Potassium-Hctz] Cough  . Lipitor [Atorvastatin]     Muscle fatigue  . Losartan Cough  . Tiazac [Diltiazem Hcl Er Beads] Cough    Current Outpatient Prescriptions  Medication Sig Dispense Refill  . ALPRAZolam (XANAX) 0.5 MG tablet Take 0.5 mg by mouth 2 (two) times daily as needed for anxiety.     Marland Kitchen aspirin 81 MG tablet Take 81 mg by mouth daily. 2-3 times weekly    . diltiazem (CARDIZEM CD) 240 MG 24 hr capsule Take 240 mg by mouth daily.    Marland Kitchen donepezil (ARICEPT) 5 MG tablet Take 5 mg by mouth at bedtime.    . ergocalciferol (VITAMIN D2) 50000 UNITS capsule Take 50,000 Units by mouth every 30 (thirty) days.    . fluticasone (FLONASE) 50 MCG/ACT nasal spray Place 1 spray into both nostrils daily as needed for allergies or rhinitis.    . furosemide (LASIX) 20 MG tablet Take 20 mg by mouth daily.    Marland Kitchen  guaiFENesin-dextromethorphan (ROBITUSSIN DM) 100-10 MG/5ML syrup Take 5 mLs by mouth every 4 (four) hours as needed for cough.    . imatinib (GLEEVEC) 400 MG tablet Take 1 tablet (400 mg total) by mouth See admin instructions. Take with meals and large glass of water.Caution:Chemotherapy. Takes on Tues, thurs, sat, sun 30 tablet 0  . omeprazole (PRILOSEC) 20 MG capsule Take 20 mg by mouth 2 (two) times daily before a meal.     . potassium chloride (K-DUR) 10 MEQ tablet Take 10 mEq by mouth daily.    . Probiotic Product (ALIGN PO) Take by mouth.    . psyllium (METAMUCIL) 58.6 % powder Take 1 packet by mouth 3 (three) times daily.    . valsartan (DIOVAN) 160 MG tablet Take 160 mg by mouth daily.     . diphenoxylate-atropine (LOMOTIL) 2.5-0.025 MG per tablet   0  . oxyCODONE-acetaminophen (ROXICET) 5-325 MG per tablet Take 1-2 tablets by mouth every 4 (four) hours as needed. (Patient not taking: Reported on 04/18/2015) 50 tablet 0  . promethazine (PHENERGAN) 25 MG  tablet Take 25 mg by mouth every 6 (six) hours as needed for nausea.      No current facility-administered medications for this visit.   Facility-Administered Medications Ordered in Other Visits  Medication Dose Route Frequency Provider Last Rate Last Dose  . trastuzumab (HERCEPTIN) 378 mg in sodium chloride 0.9 % 250 mL chemo infusion  6 mg/kg (Treatment Plan Actual) Intravenous Once Nicholas Lose, MD 536 mL/hr at 04/18/15 1603 378 mg at 04/18/15 1603    OBJECTIVE: Older Black woman who appears stated age 25 Vitals:   04/18/15 1404  BP: 150/67  Pulse: 58  Temp: 97.5 F (36.4 C)  Resp: 18     Body mass index is 23.99 kg/(m^2).    ECOG FS:1 - Symptomatic but completely ambulatory  Sclerae unicteric, pupils round and equal Oropharynx clear and moist-- no thrush or other lesions No cervical or supraclavicular adenopathy Lungs no rales or rhonchi Heart regular rate and rhythm Abd soft, nontender, positive bowel sounds MSK no  focal spinal tenderness, no upper extremity lymphedema Neuro: nonfocal, well oriented, appropriate affect Breasts: deferred  LAB RESULTS:  CMP     Component Value Date/Time   NA 144 04/18/2015 1343   NA 143 02/26/2015 1231   K 4.0 04/18/2015 1343   K 3.6 02/26/2015 1231   CL 105 02/26/2015 1231   CL 106 02/21/2013 1355   CO2 28 04/18/2015 1343   CO2 24 06/12/2014 2040   GLUCOSE 80 04/18/2015 1343   GLUCOSE 105* 02/26/2015 1231   GLUCOSE 92 02/21/2013 1355   BUN 23.8 04/18/2015 1343   BUN 15 02/26/2015 1231   CREATININE 0.9 04/18/2015 1343   CREATININE 0.90 02/26/2015 1231   CALCIUM 9.3 04/18/2015 1343   CALCIUM 9.7 06/12/2014 2040   PROT 6.5 04/18/2015 1343   PROT 8.4* 06/12/2014 2040   ALBUMIN 3.8 04/18/2015 1343   ALBUMIN 4.3 06/12/2014 2040   AST 18 04/18/2015 1343   AST 38* 06/12/2014 2040   ALT 15 04/18/2015 1343   ALT 15 06/12/2014 2040   ALKPHOS 114 04/18/2015 1343   ALKPHOS 106 06/12/2014 2040   BILITOT <0.20 04/18/2015 1343   BILITOT 0.4 06/12/2014 2040   GFRNONAA 80* 06/12/2014 2040   GFRAA >90 06/12/2014 2040    INo results found for: SPEP, UPEP  Lab Results  Component Value Date   WBC 3.7* 04/18/2015   NEUTROABS 1.7 04/18/2015   HGB 11.0* 04/18/2015   HCT 33.8* 04/18/2015   MCV 91.6 04/18/2015   PLT 234 04/18/2015      Chemistry      Component Value Date/Time   NA 144 04/18/2015 1343   NA 143 02/26/2015 1231   K 4.0 04/18/2015 1343   K 3.6 02/26/2015 1231   CL 105 02/26/2015 1231   CL 106 02/21/2013 1355   CO2 28 04/18/2015 1343   CO2 24 06/12/2014 2040   BUN 23.8 04/18/2015 1343   BUN 15 02/26/2015 1231   CREATININE 0.9 04/18/2015 1343   CREATININE 0.90 02/26/2015 1231      Component Value Date/Time   CALCIUM 9.3 04/18/2015 1343   CALCIUM 9.7 06/12/2014 2040   ALKPHOS 114 04/18/2015 1343   ALKPHOS 106 06/12/2014 2040   AST 18 04/18/2015 1343   AST 38* 06/12/2014 2040   ALT 15 04/18/2015 1343   ALT 15 06/12/2014 2040   BILITOT  <0.20 04/18/2015 1343   BILITOT 0.4 06/12/2014 2040       No results found for: LABCA2  No components  found for: TAVWP794  No results for input(s): INR in the last 168 hours.  Urinalysis    Component Value Date/Time   COLORURINE YELLOW 06/12/2014 2231   APPEARANCEUR CLEAR 06/12/2014 2231   LABSPEC 1.007 06/12/2014 2231   PHURINE 6.5 06/12/2014 2231   GLUCOSEU NEGATIVE 06/12/2014 2231   HGBUR NEGATIVE 06/12/2014 2231   BILIRUBINUR NEGATIVE 06/12/2014 2231   KETONESUR NEGATIVE 06/12/2014 2231   PROTEINUR NEGATIVE 06/12/2014 2231   UROBILINOGEN 0.2 06/12/2014 2231   NITRITE NEGATIVE 06/12/2014 2231   LEUKOCYTESUR NEGATIVE 06/12/2014 2231    STUDIES: No results found.  ASSESSMENT: 81 y.o. [place] woman status post left breast biopsy 01/23/2015 for a clinical T1b N0, stage IA invasive ductal carcinoma, grade 3, estrogen and progesterone receptor negative, but HER-2 amplified, with an MIB-1 of 79%  (1) genetics testing requested--patient with breast cancer, family history of ovarian cancer  (2) left lumpectomy with sentinel lymph node biopsy on 02/27/2015  (3) adjuvant anti-HER-2 immunotherapy with trastuzumab every 3 weeks through 1 year, to start 03/28/15. Patient declined chemotherapy (abraxane).  (4) radiation to begin 04/23/2015  (5) history of CLL, continues on Gleevec daily  PLAN: Pamela House looks and feels well today. The labs were reviewed in detail and were entirely stable. She will proceed with her next dose of trastuzumab as planned today.   She will begin radiation early next week.   Pamela House will continue trastuzumab every 3 weeks. Her next follow up visit will be in 2 months. She understands and agree with this plan. She knows the goal of treatment in her case is cure. She has been encouraged to call with any issues that might arise before her next visit here.   Laurie Panda, NP   04/18/2015 4:10 PM

## 2015-04-18 NOTE — Patient Instructions (Signed)
[facility] Discharge Instructions for Patients  Today you received the following: Herceptin   To help prevent nausea and vomiting after your treatment, we encourage you to take your nausea medication as directed.   If you develop nausea and vomiting that is not controlled by your nausea medication, call the clinic.   BELOW ARE SYMPTOMS THAT SHOULD BE REPORTED IMMEDIATELY:  *FEVER GREATER THAN 100.5 F  *CHILLS WITH OR WITHOUT FEVER  NAUSEA AND VOMITING THAT IS NOT CONTROLLED WITH YOUR NAUSEA MEDICATION  *UNUSUAL SHORTNESS OF BREATH  *UNUSUAL BRUISING OR BLEEDING  TENDERNESS IN MOUTH AND THROAT WITH OR WITHOUT PRESENCE OF ULCERS  *URINARY PROBLEMS  *BOWEL PROBLEMS  UNUSUAL RASH Items with * indicate a potential emergency and should be followed up as soon as possible.  Feel free to call the clinic you have any questions or concerns. The clinic phone number is [phone].  Please show the [facility] at check-in to the Emergency Department and triage nurse.

## 2015-04-22 ENCOUNTER — Ambulatory Visit
Admission: RE | Admit: 2015-04-22 | Discharge: 2015-04-22 | Disposition: A | Discharge status: Home / Self Care | Payer: Medicare Other | Source: Ambulatory Visit | Attending: Radiation Oncology | Admitting: Radiation Oncology

## 2015-04-22 ENCOUNTER — Ambulatory Visit: Payer: Medicare Other | Admitting: Physical Therapy

## 2015-04-22 DIAGNOSIS — C50912 Malignant neoplasm of unspecified site of left female breast: Secondary | ICD-10-CM

## 2015-04-22 DIAGNOSIS — Z510 Encounter for antineoplastic radiation therapy: Secondary | ICD-10-CM | POA: Diagnosis not present

## 2015-04-22 NOTE — Progress Notes (Signed)
Name: Pamela House   MRN: [id number]  Date:  04/22/2015  DOB: [birth year]  Status:outpatient    DIAGNOSIS: Breast cancer.  CONSENT VERIFIED: yes   SET UP: Patient is setup supine   IMMOBILIZATION:  The following immobilization was used:Custom Moldable Pillow, breast board.   NARRATIVE: [name] was brought to the [facility].  Identity was confirmed.  All relevant records and images related to the planned course of therapy were reviewed.  Then, the patient was positioned in a stable reproducible clinical set-up for radiation therapy.  Wires were placed to delineate the clinical extent of breast tissue. A wire was placed on the scar as well.  CT images were obtained.  An isocenter was placed. Skin markings were placed.  The CT images were loaded into the planning software where the target and avoidance structures were contoured.  The radiation prescription was entered and confirmed. The patient was discharged in stable condition and tolerated simulation well.    TREATMENT PLANNING NOTE:  Treatment planning then occurred. I have requested : MLC's, isodose plan, basic dose calculation  I personally designed and supervised the construction of 3 medically necessary complex treatment devices for the protection of critical normal structures including the lungs and contralateral breast as well as the immobilization device which is necessary for set up certainty.   3D simulation occurred. I requested and analyzed a dose volume histogram of the heart, lungs and lumpectomy cavity.

## 2015-04-24 ENCOUNTER — Other Ambulatory Visit: Payer: Self-pay | Admitting: *Deleted

## 2015-04-24 NOTE — Telephone Encounter (Signed)
Chart opened in Error

## 2015-04-25 DIAGNOSIS — Z510 Encounter for antineoplastic radiation therapy: Secondary | ICD-10-CM | POA: Diagnosis not present

## 2015-04-28 ENCOUNTER — Telehealth: Payer: Self-pay | Admitting: *Deleted

## 2015-04-28 DIAGNOSIS — C9210 Chronic myeloid leukemia, BCR/ABL-positive, not having achieved remission: Secondary | ICD-10-CM

## 2015-04-28 MED ORDER — IMATINIB MESYLATE 400 MG PO TABS: 400.0000 mg | ORAL_TABLET | ORAL | Status: DC

## 2015-04-28 NOTE — Telephone Encounter (Signed)
Reviewed with MD request for refill on Gleevec per therapy initiated by Dr Julien Nordmann for [facility] dx in 2010. Pt is now being seen by Dr Jana Hakim for a breast cancer dx and receiving herceptin therapy q 3 weeks as well as pt is currently receiving radiation therapy.  Per Dr Jana Hakim refill request obtained as well as lab order given for next scheduled lab date.  Refill escribed to pt's pharmacy.

## 2015-04-28 NOTE — Telephone Encounter (Signed)
SPOKE WITH DR.MOHAMED. PT. IS NOW SEEING DR.MAGRINAT SO HE WILL NEED TO DO THE REFILL FOR GLEEVEC. NOTIFIED DR.MAGRINAT'S NURSE, VAL DODD,RN. SHE WILL SPEAK WITH DR.MAGRINAT CONCERNING THE GLEEVEC REFILL. NOTIFIED PT.'S DAUGHTER. SHE VOICES UNDERSTANDING.

## 2015-04-29 ENCOUNTER — Ambulatory Visit
Admission: RE | Admit: 2015-04-29 | Discharge: 2015-04-29 | Disposition: A | Discharge status: Home / Self Care | Payer: Medicare Other | Source: Ambulatory Visit | Attending: Radiation Oncology | Admitting: Radiation Oncology

## 2015-04-29 DIAGNOSIS — Z510 Encounter for antineoplastic radiation therapy: Secondary | ICD-10-CM | POA: Diagnosis not present

## 2015-04-30 ENCOUNTER — Ambulatory Visit
Admission: RE | Admit: 2015-04-30 | Discharge: 2015-04-30 | Disposition: A | Discharge status: Home / Self Care | Payer: Medicare Other | Source: Ambulatory Visit | Attending: Radiation Oncology | Admitting: Radiation Oncology

## 2015-04-30 DIAGNOSIS — Z510 Encounter for antineoplastic radiation therapy: Secondary | ICD-10-CM | POA: Diagnosis not present

## 2015-05-01 ENCOUNTER — Telehealth: Payer: Self-pay | Admitting: *Deleted

## 2015-05-01 ENCOUNTER — Ambulatory Visit
Admission: RE | Admit: 2015-05-01 | Discharge: 2015-05-01 | Disposition: A | Discharge status: Home / Self Care | Payer: Medicare Other | Source: Ambulatory Visit | Attending: Radiation Oncology | Admitting: Radiation Oncology

## 2015-05-01 DIAGNOSIS — C50412 Malignant neoplasm of upper-outer quadrant of left female breast: Secondary | ICD-10-CM

## 2015-05-01 DIAGNOSIS — Z510 Encounter for antineoplastic radiation therapy: Secondary | ICD-10-CM | POA: Diagnosis not present

## 2015-05-01 DIAGNOSIS — C50912 Malignant neoplasm of unspecified site of left female breast: Secondary | ICD-10-CM

## 2015-05-01 MED ORDER — ALRA NON-METALLIC DEODORANT (RAD-ONC)
1.0000 "application " | Freq: Once | TOPICAL | Status: AC
  Administered 2015-05-01: 1 via TOPICAL

## 2015-05-01 MED ORDER — RADIAPLEXRX EX GEL
Freq: Once | CUTANEOUS | Status: AC
  Administered 2015-05-01: 18:00:00 via TOPICAL

## 2015-05-01 NOTE — Progress Notes (Signed)
Pt here for patient teaching.  Pt given . Pt reports they have not watched the Radiation Therapy Education video . Reviewed areas of pertinence such as  . Pt able to give teach back of to pat skin, use unscented/gentle soap and drink plenty of water,apply Radiaplex bid, avoid applying anything to skin within 4 hours of treatment, avoid wearing an under wire bra and to use an electric razor if they must shave. Pt needs reinforcement and verbalizes understanding of information given and will contact nursing with any questions or concerns.  Wrote information down on paper as patient has dementia.Walked her outside to car as daughter forgot her cellphone.Informed daughter to read book and that I would reinforce information on doctor day which is every Tuesday after treatment.

## 2015-05-01 NOTE — Telephone Encounter (Signed)
MESSAGE LEFT- GLEEVEC REFILL WAS CALLED TO [facility] [place] ON 04/28/15. DR.MAGRINAT'S NURSE, VAL DODD,RN SPOKE TO THE PHARMACIST, DONNIE. INSTRUCTED PT.'S DAUGHTER TO RETURN CALL IF THERE IS A PROBLEM.

## 2015-05-02 ENCOUNTER — Ambulatory Visit
Admission: RE | Admit: 2015-05-02 | Discharge: 2015-05-02 | Disposition: A | Discharge status: Home / Self Care | Payer: Medicare Other | Source: Ambulatory Visit | Attending: Radiation Oncology | Admitting: Radiation Oncology

## 2015-05-02 DIAGNOSIS — Z510 Encounter for antineoplastic radiation therapy: Secondary | ICD-10-CM | POA: Diagnosis not present

## 2015-05-05 ENCOUNTER — Ambulatory Visit
Admission: RE | Admit: 2015-05-05 | Discharge: 2015-05-05 | Disposition: A | Discharge status: Home / Self Care | Payer: Medicare Other | Source: Ambulatory Visit | Attending: Radiation Oncology | Admitting: Radiation Oncology

## 2015-05-05 ENCOUNTER — Other Ambulatory Visit: Payer: Self-pay | Admitting: *Deleted

## 2015-05-05 DIAGNOSIS — C9210 Chronic myeloid leukemia, BCR/ABL-positive, not having achieved remission: Secondary | ICD-10-CM

## 2015-05-05 DIAGNOSIS — Z510 Encounter for antineoplastic radiation therapy: Secondary | ICD-10-CM | POA: Diagnosis not present

## 2015-05-05 MED ORDER — IMATINIB MESYLATE 400 MG PO TABS: 400.0000 mg | ORAL_TABLET | ORAL | Status: DC

## 2015-05-05 NOTE — Telephone Encounter (Signed)
Patient's daughter called reporting she is "getting the run around about mom's medication.  Mateo Flow called this in to [facility] but they say they don't have a record of this.  She's been out of medication since July 16."  Confirmed pharmacy name and location.  [facility] [phone].  Spoke with Ronalee Belts.  Refill given to cover through next F/U with Gentry Fitz NP.

## 2015-05-06 ENCOUNTER — Ambulatory Visit
Admission: RE | Admit: 2015-05-06 | Discharge: 2015-05-06 | Disposition: A | Discharge status: Home / Self Care | Payer: Medicare Other | Source: Ambulatory Visit | Attending: Radiation Oncology | Admitting: Radiation Oncology

## 2015-05-06 DIAGNOSIS — C50412 Malignant neoplasm of upper-outer quadrant of left female breast: Secondary | ICD-10-CM

## 2015-05-06 DIAGNOSIS — Z510 Encounter for antineoplastic radiation therapy: Secondary | ICD-10-CM | POA: Diagnosis not present

## 2015-05-06 NOTE — Progress Notes (Signed)
Weekly Management Note Current Dose: 13.35  Gy  Projected Dose: 52.72 Gy   Narrative:  The patient presents for routine under treatment assessment.  CBCT/MVCT images/Port film x-rays were reviewed.  The chart was checked. No complaints except arm pain. She went to [facility] class and says she does her exercises (daughter thinks she forgets). RN education performed.  Physical Findings: Weight:  . Unchanged  Impression:  The patient is tolerating radiation.  Plan:  Continue treatment as planned. Continue RT.

## 2015-05-07 ENCOUNTER — Encounter: Payer: Self-pay | Admitting: Oncology

## 2015-05-07 ENCOUNTER — Ambulatory Visit
Admission: RE | Admit: 2015-05-07 | Discharge: 2015-05-07 | Disposition: A | Discharge status: Home / Self Care | Payer: Medicare Other | Source: Ambulatory Visit | Attending: Radiation Oncology | Admitting: Radiation Oncology

## 2015-05-07 DIAGNOSIS — Z510 Encounter for antineoplastic radiation therapy: Secondary | ICD-10-CM | POA: Diagnosis not present

## 2015-05-07 NOTE — Progress Notes (Signed)
I sent message to Pamela House to see if she got auth for [facility]. I recd fax from [facility] that insurance will not cover it???

## 2015-05-08 ENCOUNTER — Ambulatory Visit
Admission: RE | Admit: 2015-05-08 | Discharge: 2015-05-08 | Disposition: A | Discharge status: Home / Self Care | Payer: Medicare Other | Source: Ambulatory Visit | Attending: Radiation Oncology | Admitting: Radiation Oncology

## 2015-05-08 DIAGNOSIS — Z510 Encounter for antineoplastic radiation therapy: Secondary | ICD-10-CM | POA: Diagnosis not present

## 2015-05-09 ENCOUNTER — Other Ambulatory Visit: Payer: Self-pay | Admitting: Oncology

## 2015-05-09 ENCOUNTER — Ambulatory Visit (HOSPITAL_BASED_OUTPATIENT_CLINIC_OR_DEPARTMENT_OTHER): Payer: Medicare Other

## 2015-05-09 ENCOUNTER — Ambulatory Visit
Admission: RE | Admit: 2015-05-09 | Discharge: 2015-05-09 | Disposition: A | Discharge status: Home / Self Care | Payer: Medicare Other | Source: Ambulatory Visit | Attending: Radiation Oncology | Admitting: Radiation Oncology

## 2015-05-09 ENCOUNTER — Other Ambulatory Visit: Payer: Self-pay | Admitting: *Deleted

## 2015-05-09 VITALS — BP 141/49 | HR 48 | Temp 98.2°F | Resp 18

## 2015-05-09 DIAGNOSIS — Z5112 Encounter for antineoplastic immunotherapy: Secondary | ICD-10-CM | POA: Diagnosis not present

## 2015-05-09 DIAGNOSIS — C50912 Malignant neoplasm of unspecified site of left female breast: Secondary | ICD-10-CM

## 2015-05-09 DIAGNOSIS — C9210 Chronic myeloid leukemia, BCR/ABL-positive, not having achieved remission: Secondary | ICD-10-CM

## 2015-05-09 DIAGNOSIS — C50919 Malignant neoplasm of unspecified site of unspecified female breast: Secondary | ICD-10-CM

## 2015-05-09 DIAGNOSIS — Z510 Encounter for antineoplastic radiation therapy: Secondary | ICD-10-CM | POA: Diagnosis not present

## 2015-05-09 LAB — CBC WITH DIFFERENTIAL/PLATELET
BASO%: 1.1 % (ref 0.0–2.0)
Basophils Absolute: 0 10*3/uL (ref 0.0–0.1)
EOS%: 2.1 % (ref 0.0–7.0)
Eosinophils Absolute: 0.1 10*3/uL (ref 0.0–0.5)
HEMATOCRIT: 36 % (ref 34.8–46.6)
HEMOGLOBIN: 11.7 g/dL (ref 11.6–15.9)
LYMPH#: 1.1 10*3/uL (ref 0.9–3.3)
LYMPH%: 29.2 % (ref 14.0–49.7)
MCH: 29.7 pg (ref 25.1–34.0)
MCHC: 32.5 g/dL (ref 31.5–36.0)
MCV: 91.2 fL (ref 79.5–101.0)
MONO#: 0.2 10*3/uL (ref 0.1–0.9)
MONO%: 6.3 % (ref 0.0–14.0)
NEUT%: 61.3 % (ref 38.4–76.8)
NEUTROS ABS: 2.3 10*3/uL (ref 1.5–6.5)
PLATELETS: 286 10*3/uL (ref 145–400)
RBC: 3.94 10*6/uL (ref 3.70–5.45)
RDW: 14.9 % — AB (ref 11.2–14.5)
WBC: 3.8 10*3/uL — AB (ref 3.9–10.3)

## 2015-05-09 LAB — COMPREHENSIVE METABOLIC PANEL (CC13)
ALBUMIN: 3.9 g/dL (ref 3.5–5.0)
ALT: 15 U/L (ref 0–55)
ANION GAP: 8 meq/L (ref 3–11)
AST: 21 U/L (ref 5–34)
Alkaline Phosphatase: 104 U/L (ref 40–150)
BILIRUBIN TOTAL: 0.25 mg/dL (ref 0.20–1.20)
BUN: 18.5 mg/dL (ref 7.0–26.0)
CALCIUM: 9.6 mg/dL (ref 8.4–10.4)
CO2: 26 mEq/L (ref 22–29)
CREATININE: 0.9 mg/dL (ref 0.6–1.1)
Chloride: 107 mEq/L (ref 98–109)
EGFR: 72 mL/min/{1.73_m2} — ABNORMAL LOW (ref >90)
Glucose: 84 mg/dl (ref 70–140)
Potassium: 3.6 mEq/L (ref 3.5–5.1)
Sodium: 142 mEq/L (ref 136–145)
TOTAL PROTEIN: 7 g/dL (ref 6.4–8.3)

## 2015-05-09 MED ORDER — DIPHENHYDRAMINE HCL 25 MG PO CAPS
ORAL_CAPSULE | ORAL | Status: AC
  Filled 2015-05-09: qty 1

## 2015-05-09 MED ORDER — ACETAMINOPHEN 325 MG PO TABS
ORAL_TABLET | ORAL | Status: AC
  Filled 2015-05-09: qty 2

## 2015-05-09 MED ORDER — HEPARIN SOD (PORK) LOCK FLUSH 100 UNIT/ML IV SOLN
500.0000 [IU] | Freq: Once | INTRAVENOUS | Status: DC | PRN
  Filled 2015-05-09: qty 5

## 2015-05-09 MED ORDER — ACETAMINOPHEN 325 MG PO TABS
650.0000 mg | ORAL_TABLET | Freq: Once | ORAL | Status: AC
  Administered 2015-05-09: 650 mg via ORAL

## 2015-05-09 MED ORDER — TRASTUZUMAB CHEMO INJECTION 440 MG
6.0000 mg/kg | Freq: Once | INTRAVENOUS | Status: AC
  Administered 2015-05-09: 378 mg via INTRAVENOUS
  Filled 2015-05-09: qty 18

## 2015-05-09 MED ORDER — SODIUM CHLORIDE 0.9 % IJ SOLN
10.0000 mL | INTRAMUSCULAR | Status: DC | PRN
  Filled 2015-05-09: qty 10

## 2015-05-09 MED ORDER — DIPHENHYDRAMINE HCL 25 MG PO CAPS
25.0000 mg | ORAL_CAPSULE | Freq: Once | ORAL | Status: AC
  Administered 2015-05-09: 25 mg via ORAL

## 2015-05-09 MED ORDER — SODIUM CHLORIDE 0.9 % IV SOLN
Freq: Once | INTRAVENOUS | Status: AC
  Administered 2015-05-09: 15:00:00 via INTRAVENOUS

## 2015-05-09 NOTE — Patient Instructions (Signed)

## 2015-05-12 ENCOUNTER — Ambulatory Visit
Admission: RE | Admit: 2015-05-12 | Discharge: 2015-05-12 | Disposition: A | Discharge status: Home / Self Care | Payer: Medicare Other | Source: Ambulatory Visit | Attending: Radiation Oncology | Admitting: Radiation Oncology

## 2015-05-12 DIAGNOSIS — Z510 Encounter for antineoplastic radiation therapy: Secondary | ICD-10-CM | POA: Diagnosis not present

## 2015-05-13 ENCOUNTER — Ambulatory Visit
Admission: RE | Admit: 2015-05-13 | Discharge: 2015-05-13 | Disposition: A | Discharge status: Home / Self Care | Payer: Medicare Other | Source: Ambulatory Visit | Attending: Radiation Oncology | Admitting: Radiation Oncology

## 2015-05-13 VITALS — BP 161/62 | HR 55 | Temp 97.9°F | Wt 133.8 lb

## 2015-05-13 DIAGNOSIS — Z510 Encounter for antineoplastic radiation therapy: Secondary | ICD-10-CM | POA: Diagnosis not present

## 2015-05-13 DIAGNOSIS — C50412 Malignant neoplasm of upper-outer quadrant of left female breast: Secondary | ICD-10-CM

## 2015-05-13 NOTE — Progress Notes (Signed)
BP 161/62 mmHg  Pulse 55  Temp(Src) 97.9 F (36.6 C)  Wt 133 lb 12.8 oz (60.691 kg)  Wt Readings from Last 3 Encounters:  05/13/15 133 lb 12.8 oz (60.691 kg)  04/18/15 135 lb 6.4 oz (61.417 kg)  04/02/15 137 lb 1.6 oz (62.188 kg)   Weekly assessment of radiation to left breast.Completed 10 of 21 treatments.Denies pain.Mild discoloration no peeling.

## 2015-05-13 NOTE — Progress Notes (Signed)
Weekly Management Note Current Dose: 26.7  Gy  Projected Dose: 52.72 Gy   Narrative:  The patient presents for routine under treatment assessment.  CBCT/MVCT images/Port film x-rays were reviewed.  The chart was checked. No complaints except arm pain (still). She takes tylenol with moderate relief.  Physical Findings: Weight: 133 lb 12.8 oz (60.691 kg). Some skin darkening over left breast.   Impression:  The patient is tolerating radiation.  Plan:  Continue treatment as planned. Continue RT.

## 2015-05-13 NOTE — Progress Notes (Deleted)
Weekly Management Note Current Dose:   Gy  Projected Dose:  Gy   Narrative:  The patient presents for routine under treatment assessment.  CBCT/MVCT images/Port film x-rays were reviewed.  The chart was checked.  Physical Findings: Weight:  . Unchanged  Impression:  The patient is tolerating radiation.  Plan:  Continue treatment as planned.

## 2015-05-14 ENCOUNTER — Ambulatory Visit
Admission: RE | Admit: 2015-05-14 | Discharge: 2015-05-14 | Disposition: A | Discharge status: Home / Self Care | Payer: Medicare Other | Source: Ambulatory Visit | Attending: Radiation Oncology | Admitting: Radiation Oncology

## 2015-05-14 DIAGNOSIS — Z510 Encounter for antineoplastic radiation therapy: Secondary | ICD-10-CM | POA: Diagnosis not present

## 2015-05-15 ENCOUNTER — Ambulatory Visit
Admission: RE | Admit: 2015-05-15 | Discharge: 2015-05-15 | Disposition: A | Discharge status: Home / Self Care | Payer: Medicare Other | Source: Ambulatory Visit | Attending: Radiation Oncology | Admitting: Radiation Oncology

## 2015-05-15 DIAGNOSIS — Z510 Encounter for antineoplastic radiation therapy: Secondary | ICD-10-CM | POA: Diagnosis not present

## 2015-05-16 ENCOUNTER — Ambulatory Visit
Admission: RE | Admit: 2015-05-16 | Discharge: 2015-05-16 | Disposition: A | Discharge status: Home / Self Care | Payer: Medicare Other | Source: Ambulatory Visit | Attending: Radiation Oncology | Admitting: Radiation Oncology

## 2015-05-16 DIAGNOSIS — Z510 Encounter for antineoplastic radiation therapy: Secondary | ICD-10-CM | POA: Diagnosis not present

## 2015-05-20 ENCOUNTER — Ambulatory Visit
Admission: RE | Admit: 2015-05-20 | Discharge: 2015-05-20 | Disposition: A | Discharge status: Home / Self Care | Payer: Medicare Other | Source: Ambulatory Visit | Attending: Radiation Oncology | Admitting: Radiation Oncology

## 2015-05-20 DIAGNOSIS — Z510 Encounter for antineoplastic radiation therapy: Secondary | ICD-10-CM | POA: Diagnosis not present

## 2015-05-20 DIAGNOSIS — C50412 Malignant neoplasm of upper-outer quadrant of left female breast: Secondary | ICD-10-CM

## 2015-05-20 NOTE — Progress Notes (Signed)
Weekly Management Note Current Dose: 37.38 Gy  Projected Dose: 52.72 Gy   Narrative:  The patient presents for routine under treatment assessment.  CBCT/MVCT images/Port film x-rays were reviewed.  The chart was checked. No complaints except skin darkness. Saw on tx machine for electron boost.   Physical Findings: Weight:  . Some skin darkening over left breast.   Impression:  The patient is tolerating radiation.  Plan:  Continue treatment as planned. Continue RT. Ok to start electrons.

## 2015-05-21 ENCOUNTER — Ambulatory Visit
Admission: RE | Admit: 2015-05-21 | Discharge: 2015-05-21 | Disposition: A | Discharge status: Home / Self Care | Payer: Medicare Other | Source: Ambulatory Visit | Attending: Radiation Oncology | Admitting: Radiation Oncology

## 2015-05-21 DIAGNOSIS — Z510 Encounter for antineoplastic radiation therapy: Secondary | ICD-10-CM | POA: Diagnosis not present

## 2015-05-22 ENCOUNTER — Ambulatory Visit: Payer: Medicare Other | Admitting: Radiation Oncology

## 2015-05-22 ENCOUNTER — Ambulatory Visit
Admission: RE | Admit: 2015-05-22 | Discharge: 2015-05-22 | Disposition: A | Discharge status: Home / Self Care | Payer: Medicare Other | Source: Ambulatory Visit | Attending: Radiation Oncology | Admitting: Radiation Oncology

## 2015-05-22 DIAGNOSIS — Z510 Encounter for antineoplastic radiation therapy: Secondary | ICD-10-CM | POA: Diagnosis not present

## 2015-05-23 ENCOUNTER — Ambulatory Visit
Admission: RE | Admit: 2015-05-23 | Discharge: 2015-05-23 | Disposition: A | Discharge status: Home / Self Care | Payer: Medicare Other | Source: Ambulatory Visit | Attending: Radiation Oncology | Admitting: Radiation Oncology

## 2015-05-23 DIAGNOSIS — Z510 Encounter for antineoplastic radiation therapy: Secondary | ICD-10-CM | POA: Diagnosis not present

## 2015-05-26 ENCOUNTER — Other Ambulatory Visit: Payer: Self-pay | Admitting: Oncology

## 2015-05-26 ENCOUNTER — Ambulatory Visit
Admission: RE | Admit: 2015-05-26 | Discharge: 2015-05-26 | Disposition: A | Discharge status: Home / Self Care | Payer: Medicare Other | Source: Ambulatory Visit | Attending: Radiation Oncology | Admitting: Radiation Oncology

## 2015-05-26 DIAGNOSIS — Z510 Encounter for antineoplastic radiation therapy: Secondary | ICD-10-CM | POA: Diagnosis not present

## 2015-05-26 LAB — BCR/ABL (LIO MMD)

## 2015-05-27 ENCOUNTER — Ambulatory Visit
Admission: RE | Admit: 2015-05-27 | Discharge: 2015-05-27 | Disposition: A | Discharge status: Home / Self Care | Payer: Medicare Other | Source: Ambulatory Visit | Attending: Radiation Oncology | Admitting: Radiation Oncology

## 2015-05-27 DIAGNOSIS — Z510 Encounter for antineoplastic radiation therapy: Secondary | ICD-10-CM | POA: Diagnosis not present

## 2015-05-28 ENCOUNTER — Ambulatory Visit
Admission: RE | Admit: 2015-05-28 | Discharge: 2015-05-28 | Disposition: A | Discharge status: Home / Self Care | Payer: Medicare Other | Source: Ambulatory Visit | Attending: Radiation Oncology | Admitting: Radiation Oncology

## 2015-05-28 ENCOUNTER — Ambulatory Visit: Admission: RE | Admit: 2015-05-28 | Payer: Medicare Other | Source: Ambulatory Visit | Admitting: Radiation Oncology

## 2015-05-28 DIAGNOSIS — Z510 Encounter for antineoplastic radiation therapy: Secondary | ICD-10-CM | POA: Diagnosis not present

## 2015-05-29 ENCOUNTER — Ambulatory Visit
Admission: RE | Admit: 2015-05-29 | Discharge: 2015-05-29 | Disposition: A | Discharge status: Home / Self Care | Payer: Medicare Other | Source: Ambulatory Visit | Attending: Radiation Oncology | Admitting: Radiation Oncology

## 2015-05-29 VITALS — BP 156/60 | HR 51 | Temp 98.2°F | Wt 135.7 lb

## 2015-05-29 DIAGNOSIS — Z510 Encounter for antineoplastic radiation therapy: Secondary | ICD-10-CM | POA: Diagnosis not present

## 2015-05-29 DIAGNOSIS — C50412 Malignant neoplasm of upper-outer quadrant of left female breast: Secondary | ICD-10-CM

## 2015-05-29 MED ORDER — BIAFINE EX EMUL
CUTANEOUS | Status: DC | PRN
  Administered 2015-05-29: 18:00:00 via TOPICAL

## 2015-05-29 NOTE — Progress Notes (Signed)
  Radiation Oncology         [phone] ________________________________  Name: Pamela House MRN: [id number]  Date: 05/29/2015  DOB: 1930/05/24  End of Treatment Note  Diagnosis:   Breast cancer, left breast   Staging form: Breast, AJCC 7th Edition     Clinical: Stage IA (T1b, N0, M0) - Signed by Chauncey Cruel, MD on 02/07/2015    Indication for treatment:  Curative     Radiation treatment dates:   04/30/15-05/29/15  Site/dose:   Left breast/ 42.72 Gy at 2.67 Gy per fraction x 21 fractions.  Left breast boost/ 10 Gy at 2 Gy per fraction x 5 fractions  Beams/energy:  Opposed tangents with reduced fields / 6 MV photons Enface electrons / 15 MeV   Narrative: The patient tolerated radiation treatment relatively well.   She had moist desquamation at the end of her treatment which was treated with genetian violet.  She continued biafene on the darkening over the rest of the breast.   Plan: The patient has completed radiation treatment. The patient will return to radiation oncology clinic for routine followup in one month. I advised them to call or return sooner if they have any questions or concerns related to their recovery or treatment.  ------------------------------------------------  Thea Silversmith, MD

## 2015-05-29 NOTE — Addendum Note (Signed)
Encounter addended by: Norm Salt, RN on: 05/29/2015  6:20 PM<BR>     Documentation filed: Inpatient MAR

## 2015-05-29 NOTE — Addendum Note (Signed)
Encounter addended by: Norm Salt, RN on: 05/29/2015  6:04 PM<BR>     Documentation filed: Dx Association, Orders

## 2015-05-29 NOTE — Addendum Note (Signed)
Encounter addended by: Norm Salt, RN on: 05/29/2015  6:15 PM<BR>     Documentation filed: Vitals Section

## 2015-05-30 ENCOUNTER — Other Ambulatory Visit (HOSPITAL_BASED_OUTPATIENT_CLINIC_OR_DEPARTMENT_OTHER): Payer: Medicare Other

## 2015-05-30 ENCOUNTER — Ambulatory Visit (HOSPITAL_BASED_OUTPATIENT_CLINIC_OR_DEPARTMENT_OTHER): Payer: Medicare Other

## 2015-05-30 ENCOUNTER — Other Ambulatory Visit: Payer: Self-pay | Admitting: *Deleted

## 2015-05-30 VITALS — BP 160/76 | HR 72 | Temp 97.9°F | Resp 18

## 2015-05-30 DIAGNOSIS — C50912 Malignant neoplasm of unspecified site of left female breast: Secondary | ICD-10-CM | POA: Diagnosis not present

## 2015-05-30 DIAGNOSIS — Z5112 Encounter for antineoplastic immunotherapy: Secondary | ICD-10-CM | POA: Diagnosis not present

## 2015-05-30 DIAGNOSIS — Z79899 Other long term (current) drug therapy: Secondary | ICD-10-CM

## 2015-05-30 DIAGNOSIS — Z5181 Encounter for therapeutic drug level monitoring: Secondary | ICD-10-CM

## 2015-05-30 LAB — COMPREHENSIVE METABOLIC PANEL (CC13)
ALT: 13 U/L (ref 0–55)
ANION GAP: 5 meq/L (ref 3–11)
AST: 18 U/L (ref 5–34)
Albumin: 3.5 g/dL (ref 3.5–5.0)
Alkaline Phosphatase: 103 U/L (ref 40–150)
BUN: 13 mg/dL (ref 7.0–26.0)
CALCIUM: 9.4 mg/dL (ref 8.4–10.4)
CHLORIDE: 109 meq/L (ref 98–109)
CO2: 28 meq/L (ref 22–29)
Creatinine: 0.9 mg/dL (ref 0.6–1.1)
EGFR: 71 mL/min/{1.73_m2} — AB (ref >90)
Glucose: 100 mg/dl (ref 70–140)
POTASSIUM: 3.6 meq/L (ref 3.5–5.1)
Sodium: 142 mEq/L (ref 136–145)
Total Bilirubin: 0.28 mg/dL (ref 0.20–1.20)
Total Protein: 6.5 g/dL (ref 6.4–8.3)

## 2015-05-30 LAB — CBC WITH DIFFERENTIAL/PLATELET
BASO%: 0.9 % (ref 0.0–2.0)
BASOS ABS: 0 10*3/uL (ref 0.0–0.1)
EOS ABS: 0.1 10*3/uL (ref 0.0–0.5)
EOS%: 3.5 % (ref 0.0–7.0)
HCT: 34.1 % — ABNORMAL LOW (ref 34.8–46.6)
HGB: 11.3 g/dL — ABNORMAL LOW (ref 11.6–15.9)
LYMPH%: 19.2 % (ref 14.0–49.7)
MCH: 30 pg (ref 25.1–34.0)
MCHC: 33.1 g/dL (ref 31.5–36.0)
MCV: 90.6 fL (ref 79.5–101.0)
MONO#: 0.2 10*3/uL (ref 0.1–0.9)
MONO%: 6.1 % (ref 0.0–14.0)
NEUT#: 1.9 10*3/uL (ref 1.5–6.5)
NEUT%: 70.3 % (ref 38.4–76.8)
PLATELETS: 240 10*3/uL (ref 145–400)
RBC: 3.77 10*6/uL (ref 3.70–5.45)
RDW: 14.7 % — ABNORMAL HIGH (ref 11.2–14.5)
WBC: 2.7 10*3/uL — ABNORMAL LOW (ref 3.9–10.3)
lymph#: 0.5 10*3/uL — ABNORMAL LOW (ref 0.9–3.3)

## 2015-05-30 MED ORDER — ACETAMINOPHEN 325 MG PO TABS
ORAL_TABLET | ORAL | Status: AC
  Filled 2015-05-30: qty 2

## 2015-05-30 MED ORDER — SODIUM CHLORIDE 0.9 % IV SOLN
Freq: Once | INTRAVENOUS | Status: AC
  Administered 2015-05-30: 15:00:00 via INTRAVENOUS

## 2015-05-30 MED ORDER — SODIUM CHLORIDE 0.9 % IV SOLN
6.0000 mg/kg | Freq: Once | INTRAVENOUS | Status: AC
  Administered 2015-05-30: 378 mg via INTRAVENOUS
  Filled 2015-05-30: qty 18

## 2015-05-30 MED ORDER — DIPHENHYDRAMINE HCL 25 MG PO CAPS
25.0000 mg | ORAL_CAPSULE | Freq: Once | ORAL | Status: AC
  Administered 2015-05-30: 25 mg via ORAL

## 2015-05-30 MED ORDER — DIPHENHYDRAMINE HCL 25 MG PO CAPS
ORAL_CAPSULE | ORAL | Status: AC
  Filled 2015-05-30: qty 1

## 2015-05-30 MED ORDER — ACETAMINOPHEN 325 MG PO TABS
650.0000 mg | ORAL_TABLET | Freq: Once | ORAL | Status: AC
  Administered 2015-05-30: 650 mg via ORAL

## 2015-05-30 NOTE — Patient Instructions (Signed)
[facility] Discharge Instructions for Patients Receiving Chemotherapy  Today you received the following chemotherapy agents herceptin   To help prevent nausea and vomiting after your treatment, we encourage you to take your nausea medication as directed   If you develop nausea and vomiting that is not controlled by your nausea medication, call the clinic.   BELOW ARE SYMPTOMS THAT SHOULD BE REPORTED IMMEDIATELY:  *FEVER GREATER THAN 100.5 F  *CHILLS WITH OR WITHOUT FEVER  NAUSEA AND VOMITING THAT IS NOT CONTROLLED WITH YOUR NAUSEA MEDICATION  *UNUSUAL SHORTNESS OF BREATH  *UNUSUAL BRUISING OR BLEEDING  TENDERNESS IN MOUTH AND THROAT WITH OR WITHOUT PRESENCE OF ULCERS  *URINARY PROBLEMS  *BOWEL PROBLEMS  UNUSUAL RASH Items with * indicate a potential emergency and should be followed up as soon as possible.  Feel free to call the clinic you have any questions or concerns. The clinic phone number is [phone].

## 2015-06-12 ENCOUNTER — Encounter: Payer: Self-pay | Admitting: Radiation Oncology

## 2015-06-12 NOTE — Progress Notes (Signed)
Radiation Oncology         [phone] ________________________________  Name: CORRINA STEFFENSEN      MRN: [id number]          Date: 04/22/2015            DOB: April 19, 1930  Optical Surface Tracking Plan:  Since intensity modulated radiotherapy (IMRT) and 3D conformal radiation treatment methods are predicated on accurate and precise positioning for treatment, intrafraction motion monitoring is medically necessary to ensure accurate and safe treatment delivery.  The ability to quantify intrafraction motion without excessive ionizing radiation dose can only be performed with optical surface tracking. Accordingly, surface imaging offers the opportunity to obtain 3D measurements of patient position throughout IMRT and 3D treatments without excessive radiation exposure.  I am ordering optical surface tracking for this patient's upcoming course of radiotherapy. ________________________________ Signature   Reference:   Ursula Alert, J, et al. Surface imaging-based analysis of intrafraction motion for breast radiotherapy patients.Journal of [place], n. 6, nov. 2014. ISSN [id number].   Available at: <http://www.jacmp.org/index.php/jacmp/article/view/4957>.   This document serves as a record of services personally performed by Thea Silversmith , MD. It was created on her behalf by Lenn Cal, a trained medical scribe. The creation of this record is based on the scribe's personal observations and the provider's statements to them. This document has been checked and approved by the attending provider.   ------------------------------------------------  Thea Silversmith, MD

## 2015-06-12 NOTE — Progress Notes (Signed)
Name: Pamela House   MRN: [id number]  Date:  05/13/2015    DOB: [birth year]  Status:outpatient    DIAGNOSIS: Breast cancer, left breast   Staging form: Breast, AJCC 7th Edition     Clinical: Stage IA (T1b, N0, M0) - Signed by Chauncey Cruel, MD on 02/07/2015   CONSENT VERIFIED: yes   SET UP: Patient is setup supine   IMMOBILIZATION:  The following immobilization was used:Custom Moldable Pillow, breast board.   NARRATIVE: Pamela House underwent complex simulation and treatment planning for her boost treatment today.  Her tumor volume was outlined on the planning CT scan. The depth of her cavity was felt to be appropriate for treatment with electrons    15  MeV electrons will be prescribed to the 100%  isodose line.   I personally oversaw and approved the construction of a unique block which will be used for beam modification purposes.  An isodose plan is requested.   This document serves as a record of services personally performed by Thea Silversmith , MD. It was created on her behalf by Lenn Cal, a trained medical scribe. The creation of this record is based on the scribe's personal observations and the provider's statements to them. This document has been checked and approved by the attending provider.   ------------------------------------------------  Thea Silversmith, MD

## 2015-06-20 ENCOUNTER — Other Ambulatory Visit: Payer: Self-pay | Admitting: *Deleted

## 2015-06-20 ENCOUNTER — Other Ambulatory Visit (HOSPITAL_BASED_OUTPATIENT_CLINIC_OR_DEPARTMENT_OTHER): Payer: Medicare Other

## 2015-06-20 ENCOUNTER — Ambulatory Visit (HOSPITAL_BASED_OUTPATIENT_CLINIC_OR_DEPARTMENT_OTHER): Payer: Medicare Other | Admitting: Nurse Practitioner

## 2015-06-20 ENCOUNTER — Ambulatory Visit (HOSPITAL_BASED_OUTPATIENT_CLINIC_OR_DEPARTMENT_OTHER): Payer: Medicare Other

## 2015-06-20 ENCOUNTER — Encounter: Payer: Self-pay | Admitting: Nurse Practitioner

## 2015-06-20 VITALS — BP 146/54 | HR 67 | Temp 98.5°F | Resp 18 | Ht 63.0 in | Wt 136.5 lb

## 2015-06-20 DIAGNOSIS — C50912 Malignant neoplasm of unspecified site of left female breast: Secondary | ICD-10-CM

## 2015-06-20 DIAGNOSIS — Z5112 Encounter for antineoplastic immunotherapy: Secondary | ICD-10-CM | POA: Diagnosis not present

## 2015-06-20 DIAGNOSIS — R197 Diarrhea, unspecified: Secondary | ICD-10-CM | POA: Diagnosis not present

## 2015-06-20 DIAGNOSIS — R300 Dysuria: Secondary | ICD-10-CM

## 2015-06-20 DIAGNOSIS — Z856 Personal history of leukemia: Secondary | ICD-10-CM | POA: Diagnosis not present

## 2015-06-20 DIAGNOSIS — C9210 Chronic myeloid leukemia, BCR/ABL-positive, not having achieved remission: Secondary | ICD-10-CM

## 2015-06-20 LAB — CBC WITH DIFFERENTIAL/PLATELET
BASO%: 1.6 % (ref 0.0–2.0)
BASOS ABS: 0 10*3/uL (ref 0.0–0.1)
EOS%: 4.8 % (ref 0.0–7.0)
Eosinophils Absolute: 0.1 10*3/uL (ref 0.0–0.5)
HEMATOCRIT: 33.6 % — AB (ref 34.8–46.6)
HEMOGLOBIN: 10.9 g/dL — AB (ref 11.6–15.9)
LYMPH#: 0.8 10*3/uL — AB (ref 0.9–3.3)
LYMPH%: 31.5 % (ref 14.0–49.7)
MCH: 29.9 pg (ref 25.1–34.0)
MCHC: 32.5 g/dL (ref 31.5–36.0)
MCV: 91.8 fL (ref 79.5–101.0)
MONO#: 0.2 10*3/uL (ref 0.1–0.9)
MONO%: 8.8 % (ref 0.0–14.0)
NEUT#: 1.3 10*3/uL — ABNORMAL LOW (ref 1.5–6.5)
NEUT%: 53.3 % (ref 38.4–76.8)
PLATELETS: 253 10*3/uL (ref 145–400)
RBC: 3.66 10*6/uL — ABNORMAL LOW (ref 3.70–5.45)
RDW: 14.5 % (ref 11.2–14.5)
WBC: 2.4 10*3/uL — ABNORMAL LOW (ref 3.9–10.3)

## 2015-06-20 LAB — COMPREHENSIVE METABOLIC PANEL (CC13)
ALBUMIN: 3.8 g/dL (ref 3.5–5.0)
ALK PHOS: 102 U/L (ref 40–150)
ALT: 17 U/L (ref 0–55)
AST: 24 U/L (ref 5–34)
Anion Gap: 6 mEq/L (ref 3–11)
BUN: 13 mg/dL (ref 7.0–26.0)
CALCIUM: 9.6 mg/dL (ref 8.4–10.4)
CO2: 25 mEq/L (ref 22–29)
CREATININE: 0.9 mg/dL (ref 0.6–1.1)
Chloride: 109 mEq/L (ref 98–109)
EGFR: 70 mL/min/{1.73_m2} — ABNORMAL LOW (ref >90)
Glucose: 87 mg/dl (ref 70–140)
Potassium: 4.3 mEq/L (ref 3.5–5.1)
Sodium: 140 mEq/L (ref 136–145)
Total Protein: 6.7 g/dL (ref 6.4–8.3)

## 2015-06-20 LAB — URINALYSIS, MICROSCOPIC - CHCC
BLOOD: NEGATIVE
Bilirubin (Urine): NEGATIVE
GLUCOSE UR CHCC: NEGATIVE mg/dL
Ketones: NEGATIVE mg/dL
Nitrite: NEGATIVE
PH: 6 (ref 4.6–8.0)
PROTEIN: NEGATIVE mg/dL
RBC / HPF: NEGATIVE (ref 0–2)
SPECIFIC GRAVITY, URINE: 1.01 (ref 1.003–1.035)
UROBILINOGEN UR: 0.2 mg/dL (ref 0.2–1)
WBC, UA: NEGATIVE (ref 0–2)

## 2015-06-20 MED ORDER — DIPHENHYDRAMINE HCL 25 MG PO CAPS
ORAL_CAPSULE | ORAL | Status: AC
  Filled 2015-06-20: qty 1

## 2015-06-20 MED ORDER — CIPROFLOXACIN HCL 500 MG PO TABS: 500.0000 mg | ORAL_TABLET | Freq: Two times a day (BID) | ORAL | Status: DC

## 2015-06-20 MED ORDER — SODIUM CHLORIDE 0.9 % IV SOLN
6.0000 mg/kg | Freq: Once | INTRAVENOUS | Status: AC
  Administered 2015-06-20: 378 mg via INTRAVENOUS
  Filled 2015-06-20: qty 18

## 2015-06-20 MED ORDER — DIPHENHYDRAMINE HCL 25 MG PO CAPS
25.0000 mg | ORAL_CAPSULE | Freq: Once | ORAL | Status: AC
  Administered 2015-06-20: 25 mg via ORAL

## 2015-06-20 MED ORDER — ACETAMINOPHEN 325 MG PO TABS
650.0000 mg | ORAL_TABLET | Freq: Once | ORAL | Status: AC
  Administered 2015-06-20: 650 mg via ORAL

## 2015-06-20 MED ORDER — IMATINIB MESYLATE 400 MG PO TABS: 400.0000 mg | ORAL_TABLET | ORAL | Status: DC

## 2015-06-20 MED ORDER — ACETAMINOPHEN 325 MG PO TABS
ORAL_TABLET | ORAL | Status: AC
  Filled 2015-06-20: qty 2

## 2015-06-20 MED ORDER — SODIUM CHLORIDE 0.9 % IV SOLN
Freq: Once | INTRAVENOUS | Status: AC
  Administered 2015-06-20: 16:00:00 via INTRAVENOUS

## 2015-06-20 NOTE — Progress Notes (Signed)
[facility]  [phone] [phone]     ID: Pamela House DOB: 06/16/1930  MR#: [id number]  CSN#:[id number]  Patient Care Team: Karrar Husain, MD as PCP - General (Internal Medicine) Gustav C Magrinat, MD as Consulting Physician (Oncology) Paul Toth III, MD as Consulting Physician (General Surgery) PCP: HUSAIN,KARRAR, MD GYN: OTHER MD:  CHIEF COMPLAINT: HER-2 positive early stage breast cancer  CURRENT TREATMENT: anti-HER2 therapy   BREAST CANCER HISTORY: Pamela House has a history of chronic myeloid leukemia followed by Dr. Mohammed. She is treated with imatinib and her most recent BCR.ABL/ABL ratio was nearly unmeasurable.   On 12/25/2014 the patient had routine bilateral screening mammography at the breast Center, showing a breast density category B. There was a possible mass in the left breast and the patient was recalled for left diagnostic mammography with tomosynthesis and left breast ultrasonography 01/02/2015. This showed an 8 mm mass with indistinct margins in the posterior third of the outer left breast, which was palpable at the 2:30 position 8 cm from the nipple. Ultrasound confirmed an oval solid mass measuring 8 mm. Ultrasound of the left axilla showed no abnormal lymphadenopathy.  Biopsy of the left breast mass in question 01/23/2015 showed ([id number]) an invasive ductal carcinoma, grade 3, estrogen and progesterone receptor negative, but with HER-2 amplification, the signals ratio being 2.38, and the number per cell 3.45. MIB-1 was 79%.  The patient's case was presented at the multidisciplinary breast cancer conference 01/29/2015. At that time it was felt that breast conserving surgery would be the first step, to be followed by adjuvant chemotherapy and anti-HER-2 immunotherapy.  The patient's subsequent history is as detailed below  INTERVAL HISTORY:  Pamela House returns for follow up of her breast cancer, accompanied by her  daughter. She is due for her next dose of trastuzumab today. The interval history is remarkable for completing radiation therapy, which she tolerated well besides some darkening. The interval history is remarkable for an increase in diarrhea and urinary incontinence. She has a history of both of these, and usually resolved the diarrhea with probiotics. In the past days she has had over 10 stools. They are loose but not watery. She now complains that she has burning with urination. She fears constipation and will not take any imodium to slow this down. She claims to be hydrating well, and supplements with Boost daily anyway  REVIEW OF SYSTEMS:  Pamela House denies fevers or chills. She has a history of reflux due to a hiatal hernia and occasional associated nausea. She is on prilosec BID for this. She endorses anxiety but no depression. She has a history of dementia and is on aricept daily, but is perfectly appropriate during today's visit. A detailed review of systems is otherwise stable.  PAST MEDICAL HISTORY: Past Medical History  Diagnosis Date  . Dyslipidemia   . GERD (gastroesophageal reflux disease)   . Dementia   . Anxiety   . Allergic rhinitis   . Hypertension   . DDD (degenerative disc disease)   . Osteoporosis   . Vitamin D deficiency   . Leukemia 2010    PAST SURGICAL HISTORY: Past Surgical History  Procedure Laterality Date  . Back surgery  1984  . Breast lumpectomy with radioactive seed and sentinel lymph node biopsy Left 02/27/2015    Procedure: LEFT BREAST LUMPECTOMY WITH RADIOACTIVE SEED AND SENTINEL LYMPH NODE BIOPSY;  Surgeon: Paul Toth III, MD;  Location: [facility];  Service: General;  Laterality: Left;      FAMILY HISTORY No family history on file. The patient's father died at the age of 103. The patient's mother died in her 80s from complications of diabetes. The patient had one full brother and one full sister there this fall sister had ovarian cancer  diagnosed late in life. The patient had 14 additional half siblings. There is no other history of breast cancer in the family.  GYNECOLOGIC HISTORY:  No LMP recorded. Patient has had a hysterectomy. Menarche age 14, first live birth age 21. She is GX P5. Ms. McKennon is not sure when she went through the change of life, but she had a hysterectomy remotely. She is not sure whether the ovaries were removed. She did not take hormone replacement.  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 Her husband died in 2001 and she lives by herself, with no pets, in the [place] school apartments off some. There are exercise groups there but not a gym. Her daughter Pamela House is inspector for a shipping company in [place]. Granddaughter Pamela House (pronounced "McLain") is a social worker for the child protective services in [place]. The patient has 2 sons who have died. She attends a local Christian church.    ADVANCED DIRECTIVES: Her daughter Pamela House is her healthcare power of attorney. She can be reached at [phone].   HEALTH MAINTENANCE: Social History  Substance Use Topics  . Smoking status: Never Smoker   . Smokeless tobacco: Not on file  . Alcohol Use: No     Colonoscopy:  PAP:  Bone density:  Lipid panel:  Allergies  Allergen Reactions  . Ace Inhibitors Cough  . Hyzaar [Losartan Potassium-Hctz] Cough  . Lipitor [Atorvastatin]     Muscle fatigue  . Losartan Cough  . Tiazac [Diltiazem Hcl Er Beads] Cough    Current Outpatient Prescriptions  Medication Sig Dispense Refill  . ALPRAZolam (XANAX) 0.5 MG tablet Take 0.5 mg by mouth 2 (two) times daily as needed for anxiety.     . aspirin 81 MG tablet Take 81 mg by mouth daily. 2-3 times weekly    . diltiazem (CARDIZEM CD) 240 MG 24 hr capsule Take 240 mg by mouth daily.    . donepezil (ARICEPT) 5 MG tablet Take 5 mg by mouth at bedtime.    . ergocalciferol (VITAMIN  D2) 50000 UNITS capsule Take 50,000 Units by mouth every 30 (thirty) days.    . fluticasone (FLONASE) 50 MCG/ACT nasal spray Place 1 spray into both nostrils daily as needed for allergies or rhinitis.    . furosemide (LASIX) 20 MG tablet Take 20 mg by mouth daily.    . omeprazole (PRILOSEC) 20 MG capsule Take 20 mg by mouth 2 (two) times daily before a meal.     . potassium chloride (K-DUR) 10 MEQ tablet Take 10 mEq by mouth daily.    . Probiotic Product (ALIGN PO) Take by mouth.    . promethazine (PHENERGAN) 25 MG tablet Take 25 mg by mouth every 6 (six) hours as needed for nausea.     . valsartan (DIOVAN) 160 MG tablet Take 160 mg by mouth daily.     . ciprofloxacin (CIPRO) 500 MG tablet Take 1 tablet (500 mg total) by mouth 2 (two) times daily. 10 tablet 0  . diphenoxylate-atropine (LOMOTIL) 2.5-0.025 MG per tablet   0  . guaiFENesin-dextromethorphan (ROBITUSSIN DM) 100-10 MG/5ML syrup Take 5 mLs by mouth every 4 (four) hours as needed   for cough.    . imatinib (GLEEVEC) 400 MG tablet Take 1 tablet (400 mg total) by mouth See admin instructions. Take with meals and large glass of water.Caution:Chemotherapy. Takes on Tues, thurs, sat, sun 30 tablet 1  . oxyCODONE-acetaminophen (ROXICET) 5-325 MG per tablet Take 1-2 tablets by mouth every 4 (four) hours as needed. (Patient not taking: Reported on 04/18/2015) 50 tablet 0  . psyllium (METAMUCIL) 58.6 % powder Take 1 packet by mouth 3 (three) times daily.     No current facility-administered medications for this visit.    OBJECTIVE: Older Black woman who appears stated age 81 Vitals:   06/20/15 1419  BP: 146/54  Pulse: 67  Temp: 98.5 F (36.9 C)  Resp: 18     Body mass index is 24.19 kg/(m^2).    ECOG FS:1 - Symptomatic but completely ambulatory  Skin: warm, dry  HEENT: sclerae anicteric, conjunctivae pink, oropharynx clear. No thrush or mucositis.  Lymph Nodes: No cervical or supraclavicular lymphadenopathy  Lungs: clear to  auscultation bilaterally, no rales, wheezes, or rhonci  Heart: regular rate and rhythm  Abdomen: round, soft, non tender, positive bowel sounds  Musculoskeletal: No focal spinal tenderness, no peripheral edema  Neuro: non focal, well oriented, positive affect  Breasts: deferred  LAB RESULTS:  CMP     Component Value Date/Time   NA 140 06/20/2015 1402   NA 143 02/26/2015 1231   K 4.3 06/20/2015 1402   K 3.6 02/26/2015 1231   CL 105 02/26/2015 1231   CL 106 02/21/2013 1355   CO2 25 06/20/2015 1402   CO2 24 06/12/2014 2040   GLUCOSE 87 06/20/2015 1402   GLUCOSE 105* 02/26/2015 1231   GLUCOSE 92 02/21/2013 1355   BUN 13.0 06/20/2015 1402   BUN 15 02/26/2015 1231   CREATININE 0.9 06/20/2015 1402   CREATININE 0.90 02/26/2015 1231   CALCIUM 9.6 06/20/2015 1402   CALCIUM 9.7 06/12/2014 2040   PROT 6.7 06/20/2015 1402   PROT 8.4* 06/12/2014 2040   ALBUMIN 3.8 06/20/2015 1402   ALBUMIN 4.3 06/12/2014 2040   AST 24 06/20/2015 1402   AST 38* 06/12/2014 2040   ALT 17 06/20/2015 1402   ALT 15 06/12/2014 2040   ALKPHOS 102 06/20/2015 1402   ALKPHOS 106 06/12/2014 2040   BILITOT <0.30 06/20/2015 1402   BILITOT 0.4 06/12/2014 2040   GFRNONAA 80* 06/12/2014 2040   GFRAA >90 06/12/2014 2040    INo results found for: SPEP, UPEP  Lab Results  Component Value Date   WBC 2.4* 06/20/2015   NEUTROABS 1.3* 06/20/2015   HGB 10.9* 06/20/2015   HCT 33.6* 06/20/2015   MCV 91.8 06/20/2015   PLT 253 06/20/2015      Chemistry      Component Value Date/Time   NA 140 06/20/2015 1402   NA 143 02/26/2015 1231   K 4.3 06/20/2015 1402   K 3.6 02/26/2015 1231   CL 105 02/26/2015 1231   CL 106 02/21/2013 1355   CO2 25 06/20/2015 1402   CO2 24 06/12/2014 2040   BUN 13.0 06/20/2015 1402   BUN 15 02/26/2015 1231   CREATININE 0.9 06/20/2015 1402   CREATININE 0.90 02/26/2015 1231      Component Value Date/Time   CALCIUM 9.6 06/20/2015 1402   CALCIUM 9.7 06/12/2014 2040   ALKPHOS 102  06/20/2015 1402   ALKPHOS 106 06/12/2014 2040   AST 24 06/20/2015 1402   AST 38* 06/12/2014 2040   ALT 17 06/20/2015 1402  ALT 15 06/12/2014 2040   BILITOT <0.30 06/20/2015 1402   BILITOT 0.4 06/12/2014 2040       No results found for: LABCA2  No components found for: LABCA125  No results for input(s): INR in the last 168 hours.  Urinalysis    Component Value Date/Time   COLORURINE YELLOW 06/12/2014 2231   APPEARANCEUR CLEAR 06/12/2014 2231   LABSPEC 1.007 06/12/2014 2231   PHURINE 6.5 06/12/2014 2231   GLUCOSEU NEGATIVE 06/12/2014 2231   HGBUR NEGATIVE 06/12/2014 2231   BILIRUBINUR NEGATIVE 06/12/2014 2231   KETONESUR NEGATIVE 06/12/2014 2231   PROTEINUR NEGATIVE 06/12/2014 2231   UROBILINOGEN 0.2 06/12/2014 2231   NITRITE NEGATIVE 06/12/2014 2231   LEUKOCYTESUR NEGATIVE 06/12/2014 2231    STUDIES: No results found.  ASSESSMENT: 81 y.o. [place] woman status post left breast biopsy 01/23/2015 for a clinical T1b N0, stage IA invasive ductal carcinoma, grade 3, estrogen and progesterone receptor negative, but HER-2 amplified, with an MIB-1 of 79%  (1) genetics testing requested--patient with breast cancer, family history of ovarian cancer  (2) left lumpectomy with sentinel lymph node biopsy on 02/27/2015  (3) adjuvant anti-HER-2 immunotherapy with trastuzumab every 3 weeks through 1 year, to start 03/28/15. Patient declined chemotherapy (abraxane).  (4) radiation to begin 04/23/2015  (5) history of CLL, continues on Gleevec daily  PLAN: Pamela House continues to manage treatment well. The labs were reviewed in detail and were entirely stable. She will proceed with her next dose of trastuzumab as planned today. She is overdue for an echocardiogram, so I am having it performed ASAP prior to her next dose.  We are collecting a urinalysis, but I believe it reasonable that Pamela House has a UTI secondary to her multiple loose stools and inability to wipe properly. I am starting her  on cipro 537m daily x 10 days. She will start imodium immediately at home.  MAlechiawill continue trastuzumab every 3 weeks. Her next follow up visit will be in 2 months with Dr. MJana Hakim She understands and agree with this plan. She knows the goal of treatment in her case is cure. She has been encouraged to call with any issues that might arise before her next visit here.   HLaurie Panda NP   06/20/2015 3:08 PM

## 2015-06-20 NOTE — Patient Instructions (Signed)
[facility] Discharge Instructions for Patients Receiving Chemotherapy  Today you received the following chemotherapy agents; Herceptin     BELOW ARE SYMPTOMS THAT SHOULD BE REPORTED IMMEDIATELY:  *FEVER GREATER THAN 100.5 F  *CHILLS WITH OR WITHOUT FEVER  NAUSEA AND VOMITING THAT IS NOT CONTROLLED WITH YOUR NAUSEA MEDICATION  *UNUSUAL SHORTNESS OF BREATH  *UNUSUAL BRUISING OR BLEEDING  TENDERNESS IN MOUTH AND THROAT WITH OR WITHOUT PRESENCE OF ULCERS  *URINARY PROBLEMS  *BOWEL PROBLEMS  UNUSUAL RASH Items with * indicate a potential emergency and should be followed up as soon as possible.  Feel free to call the clinic you have any questions or concerns. The clinic phone number is [phone].  Please show the [facility] at check-in to the Emergency Department and triage nurse.

## 2015-06-23 LAB — URINE CULTURE

## 2015-06-24 ENCOUNTER — Other Ambulatory Visit: Payer: Self-pay | Admitting: Nurse Practitioner

## 2015-06-24 DIAGNOSIS — C50912 Malignant neoplasm of unspecified site of left female breast: Secondary | ICD-10-CM

## 2015-06-30 ENCOUNTER — Ambulatory Visit (HOSPITAL_COMMUNITY)
Admission: RE | Admit: 2015-06-30 | Discharge: 2015-06-30 | Disposition: A | Discharge status: Home / Self Care | Payer: Medicare Other | Source: Ambulatory Visit | Attending: Nurse Practitioner | Admitting: Nurse Practitioner

## 2015-06-30 ENCOUNTER — Encounter (HOSPITAL_COMMUNITY): Payer: Self-pay | Admitting: Nurse Practitioner

## 2015-06-30 ENCOUNTER — Telehealth: Payer: Self-pay | Admitting: *Deleted

## 2015-06-30 DIAGNOSIS — I340 Nonrheumatic mitral (valve) insufficiency: Secondary | ICD-10-CM | POA: Diagnosis not present

## 2015-06-30 DIAGNOSIS — I10 Essential (primary) hypertension: Secondary | ICD-10-CM | POA: Insufficient documentation

## 2015-06-30 DIAGNOSIS — I517 Cardiomegaly: Secondary | ICD-10-CM | POA: Diagnosis not present

## 2015-06-30 DIAGNOSIS — C50912 Malignant neoplasm of unspecified site of left female breast: Secondary | ICD-10-CM | POA: Insufficient documentation

## 2015-06-30 NOTE — Progress Notes (Signed)
  Echocardiogram 2D Echocardiogram has been performed.  Pamela House 06/30/2015, 4:01 PM

## 2015-06-30 NOTE — Telephone Encounter (Signed)
CALLED PATIENT TO ASK QUESTION, LVM FOR A RETURN CALL

## 2015-07-01 ENCOUNTER — Telehealth: Payer: Self-pay | Admitting: *Deleted

## 2015-07-01 ENCOUNTER — Inpatient Hospital Stay: Admission: RE | Admit: 2015-07-01 | Payer: Self-pay | Source: Ambulatory Visit | Admitting: Radiation Oncology

## 2015-07-01 NOTE — Telephone Encounter (Signed)
Called patient's daughter, Vernie Ammons per patient, Pamela House's request to inform that fu visit for today is with the wrong doctor, I have offered an appt. With her right doc- Dr. Pablo Ledger on 07-03-15 @ 4:20 pm, lvm for a return call

## 2015-07-01 NOTE — Telephone Encounter (Signed)
Patient called reporting she spoke with someone about Thursday's appointment and needs to know if anyone has contacted her daughter who works 12-hr shifts.  Checked with Enid Derry with Radiation Oncology.  Confirmed she spoke with patient's daughter.

## 2015-07-01 NOTE — Telephone Encounter (Signed)
Opened in error.

## 2015-07-03 ENCOUNTER — Encounter: Payer: Self-pay | Admitting: Radiation Oncology

## 2015-07-03 ENCOUNTER — Ambulatory Visit
Admission: RE | Admit: 2015-07-03 | Discharge: 2015-07-03 | Disposition: A | Discharge status: Home / Self Care | Payer: Medicare Other | Source: Ambulatory Visit | Attending: Radiation Oncology | Admitting: Radiation Oncology

## 2015-07-03 VITALS — BP 130/56 | HR 63 | Temp 97.7°F | Resp 16 | Wt 135.6 lb

## 2015-07-03 DIAGNOSIS — C50412 Malignant neoplasm of upper-outer quadrant of left female breast: Secondary | ICD-10-CM

## 2015-07-03 NOTE — Progress Notes (Signed)
Weight and vitals stable. Skin of left breast has returned to normal color and appearance. Patient continues to moisturize area daily. Denies nipple discharge from left breast. No lymphedema of left arm noted. Reports fatigue and weakness continues. Reports several episodes per day where she coughs until she vomits. She often feels lightheaded. Denies taking arimidex or tamoxifen.   BP 130/56 mmHg  Pulse 63  Temp(Src) 97.7 F (36.5 C) (Oral)  Resp 16  Wt 135 lb 9.6 oz (61.508 kg)  SpO2 100% Wt Readings from Last 3 Encounters:  07/03/15 135 lb 9.6 oz (61.508 kg)  06/20/15 136 lb 8 oz (61.916 kg)  05/29/15 135 lb 11.2 oz (61.553 kg)

## 2015-07-03 NOTE — Progress Notes (Signed)
   Department of Radiation Oncology  Phone:  [phone] Fax:        [phone]   Name: Pamela House MRN: [id number]  DOB: Jan 21, 1930  Date: 07/03/2015  Follow Up Visit Note  Diagnosis: Breast cancer, left breast ([place])   Staging form: Breast, AJCC 7th Edition     Clinical: Stage IA (T1b, N0, M0) - Signed by Chauncey Cruel, MD on 02/07/2015   Summary and Interval since last radiation: The patient's radiation treatment dates extended from  04/30/2015-05/29/2015. The site and does include the left breast at 42.72 Gy at 2.67 Gy per fraction x 21 fractions and the left breast boost at 10 Gy at 2 Gy per fraction x 5 fractions. The beams and energy used include opposed tangents with reduced fields at 6 MV photons and enface electrons at 15 MeV.  Interval History: Pamela House presents today for routine follow-up appointment. The patient's weight and vitals are currently stable. She reports using moisturizer daily on the treated area. She denies nipple discharge from left breast. However, she reports that symptoms of fatigue and weakness continues. She reports that several times a day she coughs until she vomits and that she often feels lightheaded. The patient denies taking the medications arimidex or tamoxifen. The patient projected a healthy mental status and was ot accompanied by her daughter for today's visit.  She continues on Herceptin.     Physical Exam:  Filed Vitals:   07/03/15 1624  BP: 130/56  Pulse: 63  Temp: 97.7 F (36.5 C)  TempSrc: Oral  Resp: 16  Weight: 135 lb 9.6 oz (61.508 kg)  SpO2: 100%  The patient is alert and oriented x3. There is no significant changes to the status of overall health to be noted at this time. Breast: Mild hyperpigmentation of the left breast with some areas of hypopigmentation in the intermammary fold. No evidence of infection.  IMPRESSION: Pamela House is a 81 y.o. female presenting to clinic in regards to her breast cancer of the left female breast  ([place]). She is recovering from the effects of radiation.  PLAN: She is doing well. We discussed the need for follow up every 4-6 months which she has scheduled.  We discussed the need for yearly mammograms which she can schedule with her OBGYN or with medical oncology. We discussed the need for sun protection in the treated area.  She can always call me with questions.  I will follow up with her on an as needed basis. Healthy methods of management in regards to reported symptoms were reviewed in detail. All vocalized questions and concerns have been addressed. She is going to follow-up with her PCP next week to address her vomiting and possible need for an inhaler, due her history of allergies. If the patient develops any further questions or concerns in regards to her treatment and recovery, she has been encouraged to contact Dr. Pablo Ledger, MD.   This document serves as a record of services personally performed by Thea Silversmith , MD. It was created on her behalf by Lenn Cal, a trained medical scribe. The creation of this record is based on the scribe's personal observations and the provider's statements to them. This document has been checked and approved by the attending provider.   ------------------------------------------------  Thea Silversmith, MD

## 2015-07-11 ENCOUNTER — Ambulatory Visit: Payer: Medicare Other

## 2015-07-11 ENCOUNTER — Other Ambulatory Visit: Payer: Self-pay | Admitting: Hematology and Oncology

## 2015-07-11 ENCOUNTER — Other Ambulatory Visit: Payer: Medicare Other

## 2015-07-14 ENCOUNTER — Telehealth: Payer: Self-pay | Admitting: Oncology

## 2015-07-14 NOTE — Telephone Encounter (Signed)
Patient called in to reschedule her appointment as she was ill last week

## 2015-07-16 ENCOUNTER — Ambulatory Visit (HOSPITAL_BASED_OUTPATIENT_CLINIC_OR_DEPARTMENT_OTHER): Payer: Medicare Other

## 2015-07-16 ENCOUNTER — Other Ambulatory Visit (HOSPITAL_BASED_OUTPATIENT_CLINIC_OR_DEPARTMENT_OTHER): Payer: Medicare Other

## 2015-07-16 ENCOUNTER — Other Ambulatory Visit: Payer: Self-pay | Admitting: *Deleted

## 2015-07-16 VITALS — BP 155/61 | HR 67 | Temp 97.8°F | Resp 18

## 2015-07-16 DIAGNOSIS — C50912 Malignant neoplasm of unspecified site of left female breast: Secondary | ICD-10-CM | POA: Diagnosis not present

## 2015-07-16 DIAGNOSIS — Z5112 Encounter for antineoplastic immunotherapy: Secondary | ICD-10-CM | POA: Diagnosis not present

## 2015-07-16 LAB — CBC WITH DIFFERENTIAL/PLATELET
BASO%: 0.3 % (ref 0.0–2.0)
BASOS ABS: 0 10*3/uL (ref 0.0–0.1)
EOS%: 4.3 % (ref 0.0–7.0)
Eosinophils Absolute: 0.2 10*3/uL (ref 0.0–0.5)
HCT: 36.8 % (ref 34.8–46.6)
HGB: 12.1 g/dL (ref 11.6–15.9)
LYMPH%: 17.9 % (ref 14.0–49.7)
MCH: 29.7 pg (ref 25.1–34.0)
MCHC: 32.9 g/dL (ref 31.5–36.0)
MCV: 90.4 fL (ref 79.5–101.0)
MONO#: 0.3 10*3/uL (ref 0.1–0.9)
MONO%: 7.4 % (ref 0.0–14.0)
NEUT#: 2.5 10*3/uL (ref 1.5–6.5)
NEUT%: 70.1 % (ref 38.4–76.8)
Platelets: 271 10*3/uL (ref 145–400)
RBC: 4.07 10*6/uL (ref 3.70–5.45)
RDW: 14.1 % (ref 11.2–14.5)
WBC: 3.5 10*3/uL — ABNORMAL LOW (ref 3.9–10.3)
lymph#: 0.6 10*3/uL — ABNORMAL LOW (ref 0.9–3.3)

## 2015-07-16 LAB — COMPREHENSIVE METABOLIC PANEL (CC13)
ALBUMIN: 4 g/dL (ref 3.5–5.0)
ALK PHOS: 124 U/L (ref 40–150)
ALT: 17 U/L (ref 0–55)
AST: 25 U/L (ref 5–34)
Anion Gap: 7 mEq/L (ref 3–11)
BUN: 13.3 mg/dL (ref 7.0–26.0)
CHLORIDE: 105 meq/L (ref 98–109)
CO2: 27 mEq/L (ref 22–29)
Calcium: 10 mg/dL (ref 8.4–10.4)
Creatinine: 0.9 mg/dL (ref 0.6–1.1)
EGFR: 70 mL/min/{1.73_m2} — AB (ref >90)
GLUCOSE: 83 mg/dL (ref 70–140)
POTASSIUM: 4 meq/L (ref 3.5–5.1)
SODIUM: 140 meq/L (ref 136–145)
Total Bilirubin: 0.37 mg/dL (ref 0.20–1.20)
Total Protein: 7.4 g/dL (ref 6.4–8.3)

## 2015-07-16 MED ORDER — DIPHENHYDRAMINE HCL 25 MG PO CAPS
25.0000 mg | ORAL_CAPSULE | Freq: Once | ORAL | Status: AC
  Administered 2015-07-16: 25 mg via ORAL

## 2015-07-16 MED ORDER — ACETAMINOPHEN 325 MG PO TABS
650.0000 mg | ORAL_TABLET | Freq: Once | ORAL | Status: AC
  Administered 2015-07-16: 650 mg via ORAL

## 2015-07-16 MED ORDER — TRASTUZUMAB CHEMO INJECTION 440 MG
6.0000 mg/kg | Freq: Once | INTRAVENOUS | Status: AC
  Administered 2015-07-16: 378 mg via INTRAVENOUS
  Filled 2015-07-16: qty 18

## 2015-07-16 MED ORDER — ACETAMINOPHEN 325 MG PO TABS
ORAL_TABLET | ORAL | Status: AC
  Filled 2015-07-16: qty 2

## 2015-07-16 MED ORDER — SODIUM CHLORIDE 0.9 % IV SOLN
Freq: Once | INTRAVENOUS | Status: AC
  Administered 2015-07-16: 16:00:00 via INTRAVENOUS

## 2015-07-16 MED ORDER — DIPHENHYDRAMINE HCL 25 MG PO CAPS
ORAL_CAPSULE | ORAL | Status: AC
  Filled 2015-07-16: qty 1

## 2015-07-17 ENCOUNTER — Other Ambulatory Visit: Payer: Self-pay | Admitting: *Deleted

## 2015-07-18 ENCOUNTER — Telehealth: Payer: Self-pay | Admitting: Oncology

## 2015-07-18 NOTE — Telephone Encounter (Signed)
lvm for pt regarding to [place] appt.Marland Kitchen

## 2015-08-01 ENCOUNTER — Other Ambulatory Visit: Payer: Medicare Other

## 2015-08-01 ENCOUNTER — Ambulatory Visit: Payer: Medicare Other

## 2015-08-06 ENCOUNTER — Other Ambulatory Visit: Payer: Self-pay | Admitting: Oncology

## 2015-08-08 ENCOUNTER — Other Ambulatory Visit: Payer: Medicare Other

## 2015-08-08 ENCOUNTER — Telehealth: Payer: Self-pay | Admitting: Nurse Practitioner

## 2015-08-08 ENCOUNTER — Ambulatory Visit: Payer: Medicare Other

## 2015-08-08 NOTE — Telephone Encounter (Signed)
Patient called in to reschedule her appointment as she is not feeling well.  I rescheduled her to the next available and then moved her  Lab visit and tx 3weeks out from that

## 2015-08-19 ENCOUNTER — Other Ambulatory Visit (HOSPITAL_BASED_OUTPATIENT_CLINIC_OR_DEPARTMENT_OTHER): Payer: Medicare Other

## 2015-08-19 ENCOUNTER — Ambulatory Visit (HOSPITAL_BASED_OUTPATIENT_CLINIC_OR_DEPARTMENT_OTHER): Payer: Medicare Other

## 2015-08-19 VITALS — BP 130/65 | HR 68 | Temp 98.6°F | Resp 18

## 2015-08-19 DIAGNOSIS — Z5112 Encounter for antineoplastic immunotherapy: Secondary | ICD-10-CM | POA: Diagnosis not present

## 2015-08-19 DIAGNOSIS — C50912 Malignant neoplasm of unspecified site of left female breast: Secondary | ICD-10-CM

## 2015-08-19 LAB — COMPREHENSIVE METABOLIC PANEL
ALT: 15 U/L (ref 0–55)
AST: 27 U/L (ref 5–34)
Albumin: 3.8 g/dL (ref 3.5–5.0)
Alkaline Phosphatase: 104 U/L (ref 40–150)
Anion Gap: 8 mEq/L (ref 3–11)
BUN: 25.3 mg/dL (ref 7.0–26.0)
CO2: 24 mEq/L (ref 22–29)
Calcium: 10.1 mg/dL (ref 8.4–10.4)
Chloride: 106 mEq/L (ref 98–109)
Creatinine: 0.9 mg/dL (ref 0.6–1.1)
EGFR: 71 mL/min/{1.73_m2} — ABNORMAL LOW (ref >90)
Glucose: 89 mg/dl (ref 70–140)
Potassium: 4.1 mEq/L (ref 3.5–5.1)
Sodium: 138 mEq/L (ref 136–145)
Total Bilirubin: <0.3 mg/dL (ref 0.20–1.20)
Total Protein: 7.1 g/dL (ref 6.4–8.3)

## 2015-08-19 LAB — CBC WITH DIFFERENTIAL/PLATELET
BASO%: 1.7 % (ref 0.0–2.0)
BASOS ABS: 0.1 10*3/uL (ref 0.0–0.1)
EOS%: 6.1 % (ref 0.0–7.0)
Eosinophils Absolute: 0.2 10*3/uL (ref 0.0–0.5)
HCT: 32.5 % — ABNORMAL LOW (ref 34.8–46.6)
HEMOGLOBIN: 10.8 g/dL — AB (ref 11.6–15.9)
LYMPH%: 28.7 % (ref 14.0–49.7)
MCH: 29.9 pg (ref 25.1–34.0)
MCHC: 33.2 g/dL (ref 31.5–36.0)
MCV: 90 fL (ref 79.5–101.0)
MONO#: 0.3 10*3/uL (ref 0.1–0.9)
MONO%: 9.2 % (ref 0.0–14.0)
NEUT%: 54.3 % (ref 38.4–76.8)
NEUTROS ABS: 1.8 10*3/uL (ref 1.5–6.5)
Platelets: 239 10*3/uL (ref 145–400)
RBC: 3.61 10*6/uL — ABNORMAL LOW (ref 3.70–5.45)
RDW: 16.1 % — AB (ref 11.2–14.5)
WBC: 3.3 10*3/uL — AB (ref 3.9–10.3)
lymph#: 1 10*3/uL (ref 0.9–3.3)

## 2015-08-19 MED ORDER — ACETAMINOPHEN 325 MG PO TABS
650.0000 mg | ORAL_TABLET | Freq: Once | ORAL | Status: AC
  Administered 2015-08-19: 650 mg via ORAL

## 2015-08-19 MED ORDER — DIPHENHYDRAMINE HCL 25 MG PO CAPS
ORAL_CAPSULE | ORAL | Status: AC
  Filled 2015-08-19: qty 1

## 2015-08-19 MED ORDER — SODIUM CHLORIDE 0.9 % IV SOLN
6.0000 mg/kg | Freq: Once | INTRAVENOUS | Status: AC
  Administered 2015-08-19: 378 mg via INTRAVENOUS
  Filled 2015-08-19: qty 18

## 2015-08-19 MED ORDER — ACETAMINOPHEN 325 MG PO TABS
ORAL_TABLET | ORAL | Status: AC
  Filled 2015-08-19: qty 2

## 2015-08-19 MED ORDER — DIPHENHYDRAMINE HCL 25 MG PO CAPS
25.0000 mg | ORAL_CAPSULE | Freq: Once | ORAL | Status: AC
  Administered 2015-08-19: 25 mg via ORAL

## 2015-08-19 MED ORDER — SODIUM CHLORIDE 0.9 % IV SOLN
Freq: Once | INTRAVENOUS | Status: AC
  Administered 2015-08-19: 15:00:00 via INTRAVENOUS

## 2015-08-19 NOTE — Patient Instructions (Signed)
[facility] Discharge Instructions for Patients Receiving Chemotherapy  Today you received the following chemotherapy agents: Herceptin   To help prevent nausea and vomiting after your treatment, we encourage you to take your nausea medication as directed.    If you develop nausea and vomiting that is not controlled by your nausea medication, call the clinic.   BELOW ARE SYMPTOMS THAT SHOULD BE REPORTED IMMEDIATELY:  *FEVER GREATER THAN 100.5 F  *CHILLS WITH OR WITHOUT FEVER  NAUSEA AND VOMITING THAT IS NOT CONTROLLED WITH YOUR NAUSEA MEDICATION  *UNUSUAL SHORTNESS OF BREATH  *UNUSUAL BRUISING OR BLEEDING  TENDERNESS IN MOUTH AND THROAT WITH OR WITHOUT PRESENCE OF ULCERS  *URINARY PROBLEMS  *BOWEL PROBLEMS  UNUSUAL RASH Items with * indicate a potential emergency and should be followed up as soon as possible.  Feel free to call the clinic you have any questions or concerns. The clinic phone number is [phone].  Please show the CHEMO ALERT CARD at check-in to the Emergency Department and triage nurse.   

## 2015-08-20 ENCOUNTER — Ambulatory Visit: Payer: Medicare Other | Admitting: Oncology

## 2015-08-20 ENCOUNTER — Ambulatory Visit: Payer: Medicare Other

## 2015-08-20 ENCOUNTER — Other Ambulatory Visit: Payer: Medicare Other

## 2015-08-29 ENCOUNTER — Ambulatory Visit: Payer: Medicare Other | Admitting: Nurse Practitioner

## 2015-08-29 ENCOUNTER — Other Ambulatory Visit: Payer: Medicare Other

## 2015-08-29 ENCOUNTER — Ambulatory Visit: Payer: Medicare Other

## 2015-09-09 ENCOUNTER — Ambulatory Visit (HOSPITAL_BASED_OUTPATIENT_CLINIC_OR_DEPARTMENT_OTHER): Payer: Medicare Other

## 2015-09-09 ENCOUNTER — Encounter: Payer: Self-pay | Admitting: Nurse Practitioner

## 2015-09-09 ENCOUNTER — Other Ambulatory Visit (HOSPITAL_BASED_OUTPATIENT_CLINIC_OR_DEPARTMENT_OTHER): Payer: Medicare Other

## 2015-09-09 ENCOUNTER — Ambulatory Visit (HOSPITAL_BASED_OUTPATIENT_CLINIC_OR_DEPARTMENT_OTHER): Payer: Medicare Other | Admitting: Nurse Practitioner

## 2015-09-09 ENCOUNTER — Telehealth: Payer: Self-pay | Admitting: Nurse Practitioner

## 2015-09-09 VITALS — BP 144/53 | HR 72 | Temp 98.6°F | Resp 20 | Wt 127.8 lb

## 2015-09-09 DIAGNOSIS — R059 Cough, unspecified: Secondary | ICD-10-CM

## 2015-09-09 DIAGNOSIS — Z5112 Encounter for antineoplastic immunotherapy: Secondary | ICD-10-CM

## 2015-09-09 DIAGNOSIS — R05 Cough: Secondary | ICD-10-CM

## 2015-09-09 DIAGNOSIS — C50412 Malignant neoplasm of upper-outer quadrant of left female breast: Secondary | ICD-10-CM

## 2015-09-09 DIAGNOSIS — C50912 Malignant neoplasm of unspecified site of left female breast: Secondary | ICD-10-CM

## 2015-09-09 LAB — COMPREHENSIVE METABOLIC PANEL
ALT: 14 U/L (ref 0–55)
AST: 23 U/L (ref 5–34)
Albumin: 4 g/dL (ref 3.5–5.0)
Alkaline Phosphatase: 93 U/L (ref 40–150)
Anion Gap: 10 mEq/L (ref 3–11)
BUN: 14.2 mg/dL (ref 7.0–26.0)
CHLORIDE: 105 meq/L (ref 98–109)
CO2: 23 meq/L (ref 22–29)
Calcium: 9.7 mg/dL (ref 8.4–10.4)
Creatinine: 0.9 mg/dL (ref 0.6–1.1)
EGFR: 72 mL/min/{1.73_m2} — AB (ref >90)
GLUCOSE: 96 mg/dL (ref 70–140)
POTASSIUM: 4.1 meq/L (ref 3.5–5.1)
SODIUM: 137 meq/L (ref 136–145)
Total Bilirubin: <0.3 mg/dL (ref 0.20–1.20)
Total Protein: 7.4 g/dL (ref 6.4–8.3)

## 2015-09-09 LAB — CBC WITH DIFFERENTIAL/PLATELET
BASO%: 1 % (ref 0.0–2.0)
Basophils Absolute: 0 10*3/uL (ref 0.0–0.1)
EOS ABS: 0.2 10*3/uL (ref 0.0–0.5)
EOS%: 5.2 % (ref 0.0–7.0)
HCT: 32.7 % — ABNORMAL LOW (ref 34.8–46.6)
HEMOGLOBIN: 10.7 g/dL — AB (ref 11.6–15.9)
LYMPH#: 1.2 10*3/uL (ref 0.9–3.3)
LYMPH%: 25.1 % (ref 14.0–49.7)
MCH: 29.9 pg (ref 25.1–34.0)
MCHC: 32.6 g/dL (ref 31.5–36.0)
MCV: 91.9 fL (ref 79.5–101.0)
MONO#: 0.5 10*3/uL (ref 0.1–0.9)
MONO%: 10.1 % (ref 0.0–14.0)
NEUT#: 2.8 10*3/uL (ref 1.5–6.5)
NEUT%: 58.6 % (ref 38.4–76.8)
Platelets: 300 10*3/uL (ref 145–400)
RBC: 3.56 10*6/uL — AB (ref 3.70–5.45)
RDW: 15.8 % — ABNORMAL HIGH (ref 11.2–14.5)
WBC: 4.8 10*3/uL (ref 3.9–10.3)

## 2015-09-09 MED ORDER — ACETAMINOPHEN 325 MG PO TABS
650.0000 mg | ORAL_TABLET | Freq: Once | ORAL | Status: AC
  Administered 2015-09-09: 650 mg via ORAL

## 2015-09-09 MED ORDER — ACETAMINOPHEN 325 MG PO TABS
ORAL_TABLET | ORAL | Status: AC
  Filled 2015-09-09: qty 2

## 2015-09-09 MED ORDER — DIPHENHYDRAMINE HCL 25 MG PO CAPS
25.0000 mg | ORAL_CAPSULE | Freq: Once | ORAL | Status: AC
  Administered 2015-09-09: 25 mg via ORAL

## 2015-09-09 MED ORDER — DIPHENHYDRAMINE HCL 25 MG PO CAPS
ORAL_CAPSULE | ORAL | Status: AC
  Filled 2015-09-09: qty 1

## 2015-09-09 MED ORDER — SODIUM CHLORIDE 0.9 % IV SOLN
6.0000 mg/kg | Freq: Once | INTRAVENOUS | Status: AC
  Administered 2015-09-09: 378 mg via INTRAVENOUS
  Filled 2015-09-09: qty 18

## 2015-09-09 MED ORDER — SODIUM CHLORIDE 0.9 % IV SOLN
Freq: Once | INTRAVENOUS | Status: AC
  Administered 2015-09-09: 16:00:00 via INTRAVENOUS

## 2015-09-09 MED ORDER — HYDROCODONE-HOMATROPINE 5-1.5 MG/5ML PO SYRP: 5.0000 mL | ORAL_SOLUTION | Freq: Four times a day (QID) | ORAL | Status: DC | PRN

## 2015-09-09 NOTE — Progress Notes (Signed)
[place]  Telephone:([phone]) [phone] Fax:(336) [phone]     ID: ANESHA HACKERT DOB: [birth year]  MR#: [id number]  MLJ#:[id number]  Patient Care Team: Wenda Low, MD as PCP - General (Internal Medicine) Chauncey Cruel, MD as Consulting Physician (Oncology) Autumn Messing III, MD as Consulting Physician (General Surgery) PCP: Wenda Low, MD GYN: OTHER MD:  CHIEF COMPLAINT: HER-2 positive early stage breast cancer  CURRENT TREATMENT: anti-HER2 therapy  BREAST CANCER HISTORY: Ms. Spainhower has a history of chronic myeloid leukemia followed by Dr. Earlie Server. She is treated with imatinib and her most recent BCR.ABL/ABL ratio was nearly unmeasurable.   On 12/25/2014 the patient had routine bilateral screening mammography at the breast Center, showing a breast density category B. There was a possible mass in the left breast and the patient was recalled for left diagnostic mammography with tomosynthesis and left breast ultrasonography 01/02/2015. This showed an 8 mm mass with indistinct margins in the posterior third of the outer left breast, which was palpable at the 2:30 position 8 cm from the nipple. Ultrasound confirmed an oval solid mass measuring 8 mm. Ultrasound of the left axilla showed no abnormal lymphadenopathy.  Biopsy of the left breast mass in question 01/23/2015 showed ([id number]) an invasive ductal carcinoma, grade 3, estrogen and progesterone receptor negative, but with HER-2 amplification, the signals ratio being 2.38, and the number per cell 3.45. MIB-1 was 79%.  The patient's case was presented at the multidisciplinary breast cancer conference 01/29/2015. At that time it was felt that breast conserving surgery would be the first step, to be followed by adjuvant chemotherapy and anti-HER-2 immunotherapy.  The patient's subsequent history is as detailed below  INTERVAL HISTORY:  Ms. Behe returns for follow up of her breast cancer, alone. Her daughter is  away attending the funeral of her stepson. Aminat is due for her next dose of trastuzumab today. The interval history is remarkable for an upper respiratory infection and now a lingering cough. She visited her PCP who started her on a steroid dose pack and z-pak. The patient completed these earlier in the week. She still has a non productive, but persistent cough that wakes her form her sleep. Occasionally she has triggered her gag reflex and even vomited. She is using robitussin as needed, but it is minimally helpful. She denies fevers or chills. She is short of breath with minimal exertion.   REVIEW OF SYSTEMS:  Sway has a history of reflux due to a hiatal hernia and occasional associated nausea. She is on prilosec BID for this. Her stools are chronically loose and she refuses to use antidiarrheal agents. She endorses anxiety but no depression. She has a history of dementia and is on aricept daily, but is perfectly appropriate during today's visit. A detailed review of systems is otherwise stable.  PAST MEDICAL HISTORY: Past Medical History  Diagnosis Date  . Dyslipidemia   . GERD (gastroesophageal reflux disease)   . Dementia   . Anxiety   . Allergic rhinitis   . Hypertension   . DDD (degenerative disc disease)   . Osteoporosis   . Vitamin D deficiency   . Leukemia ([place]) 2010  . Radiation 04/30/15-05/1515    Left breast    PAST SURGICAL HISTORY: Past Surgical History  Procedure Laterality Date  . Back surgery  1984  . Breast lumpectomy with radioactive seed and sentinel lymph node biopsy Left 02/27/2015    Procedure: LEFT BREAST LUMPECTOMY WITH RADIOACTIVE SEED AND SENTINEL LYMPH NODE BIOPSY;  Surgeon:  Autumn Messing III, MD;  Location: [facility];  Service: General;  Laterality: Left;    FAMILY HISTORY No family history on file. The patient's father died at the age of 59. The patient's mother died in her 00P from complications of diabetes. The patient had one full brother  and one full sister there this fall sister had ovarian cancer diagnosed late in life. The patient had 14 additional half siblings. There is no other history of breast cancer in the family.  GYNECOLOGIC HISTORY:  No LMP recorded. Patient has had a hysterectomy. Menarche age 81, first live birth age 81. She is GX P5. [name] is not sure when she went through the change of life, but she had a hysterectomy remotely. She is not sure whether the ovaries were removed. She did not take hormone replacement.  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 Her husband died in 18-Nov-1999 and she lives by herself, with no pets, in the [place] school apartments off some. There are exercise groups there but not a gym. Her daughter [name] is Agricultural consultant for a shipping company in [place]. Granddaughter [name] (pronounced "Trey Paula") is a Education officer, museum for the child protective services in [place]. The patient has 2 sons who have died. She attends a [employer].    ADVANCED DIRECTIVES: Her daughter [name] is her healthcare power of attorney. She can be reached at [phone].   HEALTH MAINTENANCE: Social History  Substance Use Topics  . Smoking status: Never Smoker   . Smokeless tobacco: Not on file  . Alcohol Use: No     Colonoscopy:  PAP:  Bone density:  Lipid panel:  Allergies  Allergen Reactions  . Ace Inhibitors Cough  . Hyzaar [Losartan Potassium-Hctz] Cough  . Lipitor [Atorvastatin]     Muscle fatigue  . Losartan Cough  . Tiazac [Diltiazem Hcl Er Beads] Cough    Current Outpatient Prescriptions  Medication Sig Dispense Refill  . ALPRAZolam (XANAX) 0.5 MG tablet Take 0.5 mg by mouth 2 (two) times daily as needed for anxiety.     Marland Kitchen aspirin 81 MG tablet Take 81 mg by mouth daily. 2-3 times weekly    . diltiazem (CARDIZEM CD) 240 MG 24 hr capsule Take 240 mg by mouth daily.    Marland Kitchen donepezil (ARICEPT) 5 MG  tablet Take 5 mg by mouth at bedtime.    . ergocalciferol (VITAMIN D2) 50000 UNITS capsule Take 50,000 Units by mouth every 30 (thirty) days.    . furosemide (LASIX) 20 MG tablet Take 20 mg by mouth daily.    Marland Kitchen imatinib (GLEEVEC) 400 MG tablet Take 1 tablet (400 mg total) by mouth See admin instructions. Take with meals and large glass of water.Caution:Chemotherapy. Takes on Tues, thurs, sat, sun 30 tablet 1  . omeprazole (PRILOSEC) 20 MG capsule Take 20 mg by mouth 2 (two) times daily before a meal.     . potassium chloride (K-DUR) 10 MEQ tablet Take 10 mEq by mouth daily.    . Probiotic Product (ALIGN PO) Take by mouth.    . valsartan (DIOVAN) 160 MG tablet Take 160 mg by mouth daily.     . diphenoxylate-atropine (LOMOTIL) 2.5-0.025 MG per tablet Reported on 09/09/2015  0  . fluticasone (FLONASE) 50 MCG/ACT nasal spray Place 1 spray into both nostrils daily as needed for allergies or rhinitis. Reported on 09/09/2015    . guaiFENesin-dextromethorphan (ROBITUSSIN DM)  100-10 MG/5ML syrup Take 5 mLs by mouth every 4 (four) hours as needed for cough. Reported on 09/09/2015    . HYDROcodone-homatropine (HYCODAN) 5-1.5 MG/5ML syrup Take 5 mLs by mouth every 6 (six) hours as needed for cough. 120 mL 0  . oxyCODONE-acetaminophen (ROXICET) 5-325 MG per tablet Take 1-2 tablets by mouth every 4 (four) hours as needed. (Patient not taking: Reported on 04/18/2015) 50 tablet 0  . psyllium (METAMUCIL) 58.6 % powder Take 1 packet by mouth 3 (three) times daily. Reported on 09/09/2015     No current facility-administered medications for this visit.   Facility-Administered Medications Ordered in Other Visits  Medication Dose Route Frequency Provider Last Rate Last Dose  . trastuzumab (HERCEPTIN) 378 mg in sodium chloride 0.9 % 250 mL chemo infusion  6 mg/kg (Treatment Plan Actual) Intravenous Once Chauncey Cruel, MD 536 mL/hr at 09/09/15 1558 378 mg at 09/09/15 1558    OBJECTIVE: Older Black woman who appears  stated age 81 Vitals:   09/09/15 1405  BP: 144/53  Pulse: 72  Temp: 98.6 F (37 C)  Resp: 20     Body mass index is 22.64 kg/(m^2).    ECOG FS:1 - Symptomatic but completely ambulatory  Skin: warm, dry  HEENT: sclerae anicteric, conjunctivae pink, oropharynx clear. No thrush or mucositis.  Lymph Nodes: No cervical or supraclavicular lymphadenopathy  Lungs: clear to auscultation bilaterally, no rales, wheezes, or rhonci  Heart: regular rate and rhythm  Abdomen: round, soft, non tender, positive bowel sounds  Musculoskeletal: No focal spinal tenderness, no peripheral edema  Neuro: non focal, well oriented, positive affect  Breasts: deferred  LAB RESULTS:  CMP     Component Value Date/Time   NA 137 09/09/2015 1353   NA 143 02/26/2015 1231   K 4.1 09/09/2015 1353   K 3.6 02/26/2015 1231   CL 105 02/26/2015 1231   CL 106 02/21/2013 1355   CO2 23 09/09/2015 1353   CO2 24 06/12/2014 2040   GLUCOSE 96 09/09/2015 1353   GLUCOSE 105* 02/26/2015 1231   GLUCOSE 92 02/21/2013 1355   BUN 14.2 09/09/2015 1353   BUN 15 02/26/2015 1231   CREATININE 0.9 09/09/2015 1353   CREATININE 0.90 02/26/2015 1231   CALCIUM 9.7 09/09/2015 1353   CALCIUM 9.7 06/12/2014 2040   PROT 7.4 09/09/2015 1353   PROT 8.4* 06/12/2014 2040   ALBUMIN 4.0 09/09/2015 1353   ALBUMIN 4.3 06/12/2014 2040   AST 23 09/09/2015 1353   AST 38* 06/12/2014 2040   ALT 14 09/09/2015 1353   ALT 15 06/12/2014 2040   ALKPHOS 93 09/09/2015 1353   ALKPHOS 106 06/12/2014 2040   BILITOT <0.30 09/09/2015 1353   BILITOT 0.4 06/12/2014 2040   GFRNONAA 80* 06/12/2014 2040   GFRAA >90 06/12/2014 2040    INo results found for: SPEP, UPEP  Lab Results  Component Value Date   WBC 4.8 09/09/2015   NEUTROABS 2.8 09/09/2015   HGB 10.7* 09/09/2015   HCT 32.7* 09/09/2015   MCV 91.9 09/09/2015   PLT 300 09/09/2015      Chemistry      Component Value Date/Time   NA 137 09/09/2015 1353   NA 143 02/26/2015 1231   K 4.1  09/09/2015 1353   K 3.6 02/26/2015 1231   CL 105 02/26/2015 1231   CL 106 02/21/2013 1355   CO2 23 09/09/2015 1353   CO2 24 06/12/2014 2040   BUN 14.2 09/09/2015 1353   BUN 15 02/26/2015 1231  CREATININE 0.9 09/09/2015 1353   CREATININE 0.90 02/26/2015 1231      Component Value Date/Time   CALCIUM 9.7 09/09/2015 1353   CALCIUM 9.7 06/12/2014 2040   ALKPHOS 93 09/09/2015 1353   ALKPHOS 106 06/12/2014 2040   AST 23 09/09/2015 1353   AST 38* 06/12/2014 2040   ALT 14 09/09/2015 1353   ALT 15 06/12/2014 2040   BILITOT <0.30 09/09/2015 1353   BILITOT 0.4 06/12/2014 2040       No results found for: LABCA2  No components found for: LABCA125  No results for input(s): INR in the last 168 hours.  Urinalysis    Component Value Date/Time   COLORURINE YELLOW 06/12/2014 2231   APPEARANCEUR CLEAR 06/12/2014 2231   LABSPEC 1.010 06/20/2015 1529   LABSPEC 1.007 06/12/2014 2231   PHURINE 6.0 06/20/2015 1529   PHURINE 6.5 06/12/2014 2231   GLUCOSEU Negative 06/20/2015 1529   GLUCOSEU NEGATIVE 06/12/2014 2231   HGBUR Negative 06/20/2015 1529   HGBUR NEGATIVE 06/12/2014 2231   BILIRUBINUR Negative 06/20/2015 1529   BILIRUBINUR NEGATIVE 06/12/2014 2231   KETONESUR Negative 06/20/2015 1529   KETONESUR NEGATIVE 06/12/2014 2231   PROTEINUR Negative 06/20/2015 1529   PROTEINUR NEGATIVE 06/12/2014 2231   UROBILINOGEN 0.2 06/20/2015 1529   UROBILINOGEN 0.2 06/12/2014 2231   NITRITE Negative 06/20/2015 1529   NITRITE NEGATIVE 06/12/2014 2231   LEUKOCYTESUR Trace 06/20/2015 1529   LEUKOCYTESUR NEGATIVE 06/12/2014 2231    STUDIES: No results found.  ASSESSMENT: 81 y.o. [place] woman status post left breast biopsy 01/23/2015 for a clinical T1b N0, stage IA invasive ductal carcinoma, grade 3, estrogen and progesterone receptor negative, but HER-2 amplified, with an MIB-1 of 79%  (1) genetics testing requested--patient with breast cancer, family history of ovarian cancer  (2) left  lumpectomy with sentinel lymph node biopsy on 02/27/2015  (3) adjuvant anti-HER-2 immunotherapy with trastuzumab every 3 weeks through 1 year, to start 03/28/15. Patient declined chemotherapy (abraxane).  (4) radiation to begin 04/23/2015  (5) history of CLL, continues on Gleevec daily  PLAN: Annaliz's cough is her chief concern today. As she has already been treated with steroids and an antibiotic, we will focus on symptom management. I have given her a prescription for hycodan syrup to use at night. She understands that this will make her drowsy, so she is to use the robitussin during the day time. If her cough has not improved in 1 week, we can consider a chest xray. She will follow up at her PCP's office as well.   The labs were reviewed in detail and were stable. She will proceed with her next dose of trastuzumab as planned. She will be due for a repeat echocardiogram in January, so I have placed orders for that to be performed accordingly.   Alizza will continue trastuzumab every 3 weeks. Her next follow up visit will be in 2 months with Dr. Jana Hakim. She understands and agree with this plan. She knows the goal of treatment in her case is cure. She has been encouraged to call with any issues that might arise before her next visit here.   Laurie Panda, NP   09/09/2015 4:01 PM

## 2015-09-09 NOTE — Patient Instructions (Signed)
[facility] Discharge Instructions for Patients Receiving Chemotherapy  Today you received the following chemotherapy agents Herceptin    If you develop nausea and vomiting that is not controlled by your nausea medication, call the clinic.   BELOW ARE SYMPTOMS THAT SHOULD BE REPORTED IMMEDIATELY:  *FEVER GREATER THAN 100.5 F  *CHILLS WITH OR WITHOUT FEVER  NAUSEA AND VOMITING THAT IS NOT CONTROLLED WITH YOUR NAUSEA MEDICATION  *UNUSUAL SHORTNESS OF BREATH  *UNUSUAL BRUISING OR BLEEDING  TENDERNESS IN MOUTH AND THROAT WITH OR WITHOUT PRESENCE OF ULCERS  *URINARY PROBLEMS  *BOWEL PROBLEMS  UNUSUAL RASH Items with * indicate a potential emergency and should be followed up as soon as possible.  Feel free to call the clinic you have any questions or concerns. The clinic phone number is [phone].  Please show the [facility] at check-in to the Emergency Department and triage nurse.

## 2015-09-09 NOTE — Telephone Encounter (Signed)
Appointments made and patient will get a new avs in chemo,echo to [facility] for precert

## 2015-09-10 ENCOUNTER — Telehealth: Payer: Self-pay | Admitting: Oncology

## 2015-09-10 NOTE — Telephone Encounter (Signed)
Called and left a message with her echo appointment

## 2015-09-12 ENCOUNTER — Ambulatory Visit: Payer: Medicare Other

## 2015-09-12 ENCOUNTER — Other Ambulatory Visit: Payer: Medicare Other

## 2015-09-15 ENCOUNTER — Encounter (HOSPITAL_COMMUNITY): Payer: Self-pay

## 2015-09-15 ENCOUNTER — Emergency Department (INDEPENDENT_AMBULATORY_CARE_PROVIDER_SITE_OTHER): Payer: Medicare Other

## 2015-09-15 ENCOUNTER — Emergency Department (HOSPITAL_COMMUNITY)
Admission: EM | Admit: 2015-09-15 | Discharge: 2015-09-15 | Disposition: A | Discharge status: Home / Self Care | Payer: Medicare Other | Source: Home / Self Care

## 2015-09-15 DIAGNOSIS — J189 Pneumonia, unspecified organism: Secondary | ICD-10-CM

## 2015-09-15 MED ORDER — LEVOFLOXACIN 500 MG PO TABS: 500.0000 mg | ORAL_TABLET | Freq: Every day | ORAL | Status: DC

## 2015-09-15 NOTE — Discharge Instructions (Signed)
If she feels worse with high fevers chills and difficulty breathing she should present to the emergency room  Because she has no wheeze at this time I do not feel inhalers or bronchodilators are necessary  Community-Acquired Pneumonia, Adult Pneumonia is an infection of the lungs. One type of pneumonia can happen while a person is in a hospital. A different type can happen when a person is not in a hospital (community-acquired pneumonia). It is easy for this kind to spread from person to person. It can spread to you if you breathe near an infected person who coughs or sneezes. Some symptoms include:  A dry cough.  A wet (productive) cough.  Fever.  Sweating.  Chest pain. HOME CARE  Take over-the-counter and prescription medicines only as told by your doctor.  Only take cough medicine if you are losing sleep.  If you were prescribed an antibiotic medicine, take it as told by your doctor. Do not stop taking the antibiotic even if you start to feel better.  Sleep with your head and neck raised (elevated). You can do this by putting a few pillows under your head, or you can sleep in a recliner.  Do not use tobacco products. These include cigarettes, chewing tobacco, and e-cigarettes. If you need help quitting, ask your doctor.  Drink enough water to keep your pee (urine) clear or pale yellow. A shot (vaccine) can help prevent pneumonia. Shots are often suggested for:  People older than 80 years of age.  People older than 80 years of age:  Who are having cancer treatment.  Who have long-term (chronic) lung disease.  Who have problems with their body's defense system (immune system). You may also prevent pneumonia if you take these actions:  Get the flu (influenza) shot every year.  Go to the dentist as often as told.  Wash your hands often. If soap and water are not available, use hand sanitizer. GET HELP IF:  You have a fever.  You lose sleep because your cough  medicine does not help. GET HELP RIGHT AWAY IF:  You are short of breath and it gets worse.  You have more chest pain.  Your sickness gets worse. This is very serious if:  You are an older adult.  Your body's defense system is weak.  You cough up blood.   This information is not intended to replace advice given to you by your health care provider. Make sure you discuss any questions you have with your health care provider.   Document Released: 02/16/2008 Document Revised: 05/21/2015 Document Reviewed: 12/25/2014 Elsevier Interactive Patient Education [employer].

## 2015-09-15 NOTE — ED Notes (Signed)
Patient here for a cough that she has had for the last ten months Currently being treated for breast cancer/luekemia Cough syrup she is currently taking is not working and too expensive

## 2015-09-15 NOTE — ED Provider Notes (Signed)
CSN: [id number]     Arrival date & time 09/15/15  1319 History   None    No chief complaint on file.   HPI  82 y/o ? chronic myeloid leukemia followed by Dr. Earlie Server. Breast cancer s/p L lumopectomy s/p XRT Hiatal hernia + GERD Chr loose stools Mild mod dementia recently Rx UTI   Recently seen at oncology practice given steroids as well as antibiotic She tells me that she's been having cough since the past 6 months She was given Hycodan syrup and Robitussin to use during the day She has had no relief from symptomatology She denies any chest pain any shortness breath any blurred vision any double vision any weakness on any one side of the body She's had no other contacts with illness She's not coughing up anything significant She has no joint pains Chest no rash  Past Medical History  Diagnosis Date  . Dyslipidemia   . GERD (gastroesophageal reflux disease)   . Dementia   . Anxiety   . Allergic rhinitis   . Hypertension   . DDD (degenerative disc disease)   . Osteoporosis   . Vitamin D deficiency   . Leukemia ([place]) 2010  . Radiation 04/30/15-05/1515    Left breast   Past Surgical History  Procedure Laterality Date  . Back surgery  1984  . Breast lumpectomy with radioactive seed and sentinel lymph node biopsy Left 02/27/2015    Procedure: LEFT BREAST LUMPECTOMY WITH RADIOACTIVE SEED AND SENTINEL LYMPH NODE BIOPSY;  Surgeon: Autumn Messing III, MD;  Location: [facility];  Service: General;  Laterality: Left;   No family history on file. Social History  Substance Use Topics  . Smoking status: Never Smoker   . Smokeless tobacco: Not on file  . Alcohol Use: No   OB History    No data available     Review of Systems see above    Allergies  Ace inhibitors; Hyzaar; Lipitor; Losartan; and Tiazac  Home Medications   Prior to Admission medications   Medication Sig Start Date End Date Taking? Authorizing Provider  ALPRAZolam Duanne Moron) 0.5 MG tablet Take 0.5  mg by mouth 2 (two) times daily as needed for anxiety.     Historical Provider, MD  aspirin 81 MG tablet Take 81 mg by mouth daily. 2-3 times weekly    Historical Provider, MD  diltiazem (CARDIZEM CD) 240 MG 24 hr capsule Take 240 mg by mouth daily.    Historical Provider, MD  diphenoxylate-atropine (LOMOTIL) 2.5-0.025 MG per tablet Reported on 09/09/2015 01/09/15   Historical Provider, MD  donepezil (ARICEPT) 5 MG tablet Take 5 mg by mouth at bedtime.    Historical Provider, MD  ergocalciferol (VITAMIN D2) 50000 UNITS capsule Take 50,000 Units by mouth every 30 (thirty) days.    Historical Provider, MD  fluticasone (FLONASE) 50 MCG/ACT nasal spray Place 1 spray into both nostrils daily as needed for allergies or rhinitis. Reported on 09/09/2015    Historical Provider, MD  furosemide (LASIX) 20 MG tablet Take 20 mg by mouth daily.    Historical Provider, MD  guaiFENesin-dextromethorphan (ROBITUSSIN DM) 100-10 MG/5ML syrup Take 5 mLs by mouth every 4 (four) hours as needed for cough. Reported on 09/09/2015    Historical Provider, MD  HYDROcodone-homatropine [facility]) 5-1.5 MG/5ML syrup Take 5 mLs by mouth every 6 (six) hours as needed for cough. 09/09/15   Laurie Panda, NP  imatinib (GLEEVEC) 400 MG tablet Take 1 tablet (400 mg total) by mouth  See admin instructions. Take with meals and large glass of water.Caution:Chemotherapy. Takes on Tues, thurs, sat, sun 06/20/15   Laurie Panda, NP  omeprazole (PRILOSEC) 20 MG capsule Take 20 mg by mouth 2 (two) times daily before a meal.  06/13/13   Historical Provider, MD  oxyCODONE-acetaminophen (ROXICET) 5-325 MG per tablet Take 1-2 tablets by mouth every 4 (four) hours as needed. Patient not taking: Reported on 04/18/2015 02/27/15   Autumn Messing III, MD  potassium chloride (K-DUR) 10 MEQ tablet Take 10 mEq by mouth daily.    Historical Provider, MD  Probiotic Product (ALIGN PO) Take by mouth.    Historical Provider, MD  psyllium (METAMUCIL) 58.6 % powder  Take 1 packet by mouth 3 (three) times daily. Reported on 09/09/2015    Historical Provider, MD  valsartan (DIOVAN) 160 MG tablet Take 160 mg by mouth daily.  05/17/13   Historical Provider, MD   Meds Ordered and Administered this Visit  Medications - No data to display  There were no vitals taken for this visit. No data found.   Physical Exam  Alert frail pleasant ? No specific respiratory distress Lung sounds are clear Abdomen soft nontender nondistended No lower extremity edema No rash   no focal neurological deficit S1-S2 no murmur rub or gallop  ED Course  Procedures (including critical care time)  Labs Review Labs Reviewed - No data to display  Imaging Review No results found.   Visual Acuity Review  Right Eye Distance:   Left Eye Distance:   Bilateral Distance:    Right Eye Near:   Left Eye Near:    Bilateral Near:         MDM  No diagnosis found.  cough-? Bronchitis versus radiation pneumonitis Chest x-ray 3:58 PM has not been reported on as yet by radiology however does  shows some confluence in the left upper lobe and blunting of the costophrenic angles on the left side consistent with probably a left lower lobar pneumonia. I gave her the options of hospital stay versus discharging home and she would much rather go home  At this stage because she does not appear to be toxic I don't even think I would get a CBC plus differential rather I would treat her empirically with Levaquin 500 mg daily for 5 days and I will cc her oncologist Dr. Jana Hakim about this regarding further follow-up in the next 1-2 days to ensure that she has some follow-up.  If she feels worse with high fevers chills and difficulty breathing she should present to the emergency room  Because she has no wheeze at this time I do not feel inhalers or bronchodilators are necessary  Warning signs were emphasized to daughter was present in room  Verneita Griffes, MD [facility] ([phone]     Nita Sells, MD 09/15/15 [phone]

## 2015-09-22 ENCOUNTER — Ambulatory Visit (HOSPITAL_COMMUNITY): Admission: RE | Admit: 2015-09-22 | Payer: Medicare Other | Source: Ambulatory Visit

## 2015-09-25 ENCOUNTER — Telehealth: Payer: Self-pay | Admitting: *Deleted

## 2015-09-25 NOTE — Telephone Encounter (Signed)
Triage pager reads: "Mom is very sick, need to talk with someone ASAP.  [phone]."  0034 called daughter West Carbo who is anxious with speech.  Tried to calm her speech and focus on assessment questions.  Mom is home alone as [place] Carbo has to work.  History of "Urgent Care visit, cxr positive for pneumonia but mom does not want to be admitted.  Home with antibiotics.  Coughing and throwing up with cough.  Saw PCP yesterday Temp = 99.7, O2 sat = 92% on room air.  New antibiotic Augnmentin ordered.  Symptoms present a long time but getting worse.  I have to work so she's alone."  Called patient who's speech sound is short of breath.  Improved with conversation.  Advised ED with symptoms.  "NOooo!  I threw up one time last Friday.  I only get s.o.b when I get up to answer phone that is at the foot of my bed.  I do not have chest pain.  I take the antibiotic, cough syrup and honey and the cough stops a few hours.  I uses flonase nasal spray is why I think I threw up."    Advised she call 911 if chest pain, increase s.o.b, fever.  Does not have thermometer.  Called daughter advising she get a thermometer, encourage fluids and complete antibiotic.  Pearl asked about next weeks treatment.  Mom will have labs and be evaluated next week.  Daughter will call 911 if symptoms progress.

## 2015-09-29 ENCOUNTER — Encounter (HOSPITAL_COMMUNITY): Payer: Self-pay | Admitting: Nurse Practitioner

## 2015-09-29 ENCOUNTER — Ambulatory Visit (HOSPITAL_COMMUNITY)
Admission: RE | Admit: 2015-09-29 | Discharge: 2015-09-29 | Disposition: A | Discharge status: Home / Self Care | Payer: Medicare Other | Source: Ambulatory Visit | Attending: Nurse Practitioner | Admitting: Nurse Practitioner

## 2015-09-29 DIAGNOSIS — I10 Essential (primary) hypertension: Secondary | ICD-10-CM | POA: Insufficient documentation

## 2015-09-29 DIAGNOSIS — C50412 Malignant neoplasm of upper-outer quadrant of left female breast: Secondary | ICD-10-CM | POA: Diagnosis not present

## 2015-09-29 DIAGNOSIS — E785 Hyperlipidemia, unspecified: Secondary | ICD-10-CM | POA: Diagnosis not present

## 2015-09-29 DIAGNOSIS — Z09 Encounter for follow-up examination after completed treatment for conditions other than malignant neoplasm: Secondary | ICD-10-CM | POA: Diagnosis present

## 2015-09-29 DIAGNOSIS — I517 Cardiomegaly: Secondary | ICD-10-CM | POA: Diagnosis not present

## 2015-09-29 NOTE — Progress Notes (Signed)
*  PRELIMINARY RESULTS* Echocardiogram 2D Echocardiogram has been performed.  Pamela House 09/29/2015, 3:41 PM

## 2015-09-30 ENCOUNTER — Telehealth: Payer: Self-pay | Admitting: *Deleted

## 2015-09-30 ENCOUNTER — Telehealth: Payer: Self-pay | Admitting: Oncology

## 2015-09-30 ENCOUNTER — Other Ambulatory Visit: Payer: Self-pay | Admitting: Nurse Practitioner

## 2015-09-30 ENCOUNTER — Ambulatory Visit: Payer: Medicare Other

## 2015-09-30 ENCOUNTER — Other Ambulatory Visit: Payer: Medicare Other

## 2015-09-30 DIAGNOSIS — R931 Abnormal findings on diagnostic imaging of heart and coronary circulation: Secondary | ICD-10-CM

## 2015-09-30 NOTE — Telephone Encounter (Signed)
Aware of md visit 1/19 at [facility]

## 2015-09-30 NOTE — Telephone Encounter (Signed)
Called pt to inform her not to come for Herceptin today b/c her EF number was too low. I told pt an appt is being made for her to visit with the cardiologist to evaluate and treat. Pt understands. I will call her when the appt has set with the heart doctor to make sure she keeps the appointment. Message to be fwd to Gentry Fitz, NP

## 2015-10-01 ENCOUNTER — Other Ambulatory Visit: Payer: Self-pay | Admitting: Nurse Practitioner

## 2015-10-01 ENCOUNTER — Ambulatory Visit (HOSPITAL_COMMUNITY): Payer: Medicare Other

## 2015-10-02 ENCOUNTER — Encounter (HOSPITAL_COMMUNITY): Payer: Self-pay

## 2015-10-02 ENCOUNTER — Other Ambulatory Visit: Payer: Self-pay | Admitting: *Deleted

## 2015-10-02 ENCOUNTER — Ambulatory Visit (HOSPITAL_COMMUNITY)
Admission: RE | Admit: 2015-10-02 | Discharge: 2015-10-02 | Disposition: A | Discharge status: Home / Self Care | Payer: Medicare Other | Source: Ambulatory Visit | Attending: Cardiology | Admitting: Cardiology

## 2015-10-02 VITALS — BP 132/70 | HR 69 | Wt 128.8 lb

## 2015-10-02 DIAGNOSIS — C50412 Malignant neoplasm of upper-outer quadrant of left female breast: Secondary | ICD-10-CM

## 2015-10-02 DIAGNOSIS — T451X5A Adverse effect of antineoplastic and immunosuppressive drugs, initial encounter: Secondary | ICD-10-CM | POA: Diagnosis not present

## 2015-10-02 DIAGNOSIS — C9210 Chronic myeloid leukemia, BCR/ABL-positive, not having achieved remission: Secondary | ICD-10-CM

## 2015-10-02 DIAGNOSIS — I429 Cardiomyopathy, unspecified: Secondary | ICD-10-CM | POA: Diagnosis not present

## 2015-10-02 DIAGNOSIS — I427 Cardiomyopathy due to drug and external agent: Secondary | ICD-10-CM

## 2015-10-02 DIAGNOSIS — I10 Essential (primary) hypertension: Secondary | ICD-10-CM | POA: Insufficient documentation

## 2015-10-02 DIAGNOSIS — C50922 Malignant neoplasm of unspecified site of left male breast: Secondary | ICD-10-CM

## 2015-10-02 MED ORDER — IMATINIB MESYLATE 400 MG PO TABS: 400.0000 mg | ORAL_TABLET | ORAL | Status: DC

## 2015-10-02 MED ORDER — CARVEDILOL 12.5 MG PO TABS: 12.5000 mg | ORAL_TABLET | Freq: Two times a day (BID) | ORAL | Status: DC

## 2015-10-02 NOTE — Progress Notes (Signed)
Patient ID: Pamela House, female   DOB: 06/14/1930, 82 y.o.   MRN: [id number] PCP: Dr. Lysle Rubens Oncologist: Dr. Jana Hakim (breast CA), Dr Earlie Server ([place])  82 yo with history of CML, breast cancer, HTN, and mild-moderate dementia presents for cardio-oncology evaluation given fall in EF while getting Herceptin therapy.  Patient has CML and has been on imatinib for an extended period.  In 5/16 she was found to have breast cancer, ER-/PR-/HER2+.  She had a lumpectomy in 6/16 and declined chemotherapy.  She has been getting Herceptin every 3 wks to be continued to 7/17.  Initial echo in 6/16 showed normal LV function.  Echo in 10/16 showed a mild fall in EF.  Echo in 1/17 has showed a more significant fall in EF along with a corresponding drop in global longitudinal strain.   Patient does not seem to have a significant cardiac history.  She says she had a remote catheterization but does not remember any of the details of this.  She was seen 09/15/15 in the ER and thought to have LLL PNA.  Main symptom was cough.  She has been on antibiotics and the cough is slowly improving.  She has no exertional chest pain, only has some soreness when she coughs.  She denies exertional dyspnea.  She does calisthenics and push-ups daily.    Labs (12/16): K 4.1, creatinine 0.9  PMH: 1. Hyperlipidemia: Myalgias with Lipitor.  2. Mild-moderate dementia 3. HTN 4. Degenerative disc disease 5. GERD 6. CML: She has been on imatinib.  Followed by Dr. Earlie Server.  7. Breast cancer: Diagnosed in 5/16.  ER-/PR-/HER2+.  Lumpectomy 6/16, declined chemotherapy. She is getting Herceptin every 3 wks to be continued to 7/17.  - Echo (6/16): EF 55-60%, GLS -19%. - Echo (10/16): EF 50-55%, GLS -21.9%. - Echo (1/17): EF 45-50%, GLS -15.7%.   SH: Lives alone, 1 living daughter, nonsmoker.   FH: Ovarian cancer  ROS: All systems reviewed and negative except as per HPI.  Current Outpatient Prescriptions  Medication Sig Dispense Refill  .  ALPRAZolam (XANAX) 0.5 MG tablet Take 0.5 mg by mouth 2 (two) times daily as needed for anxiety.     Marland Kitchen aspirin 81 MG tablet Take 81 mg by mouth daily. 2-3 times weekly    . diphenoxylate-atropine (LOMOTIL) 2.5-0.025 MG per tablet Reported on 09/09/2015  0  . donepezil (ARICEPT) 5 MG tablet Take 5 mg by mouth at bedtime.    . ergocalciferol (VITAMIN D2) 50000 UNITS capsule Take 50,000 Units by mouth every 30 (thirty) days.    . fluticasone (FLONASE) 50 MCG/ACT nasal spray Place 1 spray into both nostrils daily as needed for allergies or rhinitis. Reported on 09/09/2015    . furosemide (LASIX) 20 MG tablet Take 20 mg by mouth as needed.    Marland Kitchen levofloxacin (LEVAQUIN) 500 MG tablet Take 1 tablet (500 mg total) by mouth daily. 5 tablet 0  . omeprazole (PRILOSEC) 20 MG capsule Take 20 mg by mouth 2 (two) times daily before a meal.     . potassium chloride (K-DUR,KLOR-CON) 10 MEQ tablet Take 10 mEq by mouth as needed (with lasix.).    Marland Kitchen Probiotic Product (ALIGN PO) Take by mouth.    . psyllium (METAMUCIL) 58.6 % powder Take 1 packet by mouth 3 (three) times daily. Reported on 09/09/2015    . valsartan (DIOVAN) 160 MG tablet Take 160 mg by mouth daily.     . carvedilol (COREG) 12.5 MG tablet Take 1 tablet (12.5 mg  total) by mouth 2 (two) times daily. 60 tablet 3  . guaiFENesin-dextromethorphan (ROBITUSSIN DM) 100-10 MG/5ML syrup Take 5 mLs by mouth every 4 (four) hours as needed for cough. Reported on 10/02/2015    . imatinib (GLEEVEC) 400 MG tablet Take 1 tablet (400 mg total) by mouth See admin instructions. Take with meals and large glass of water.Caution:Chemotherapy. Takes on Tues, thurs, sat, sun 30 tablet 1   No current facility-administered medications for this encounter.   BP 132/70 mmHg  Pulse 69  Wt 128 lb 12 oz (58.401 kg)  SpO2 99% General: NAD Neck: No JVD, no thyromegaly or thyroid nodule.  Lungs: Clear to auscultation bilaterally with normal respiratory effort. CV: Nondisplaced PMI.   Heart regular S1/S2, no S3/S4, 1/6 SEM RUSB.  No peripheral edema.  No carotid bruit.  Normal pedal pulses.  Abdomen: Soft, nontender, no hepatosplenomegaly, no distention.  Skin: Intact without lesions or rashes.  Neurologic: Alert and oriented x 3.  Psych: Normal affect. Extremities: No clubbing or cyanosis.  HEENT: Normal.   Assessment/Plan: 1. Cardiomyopathy: EF and global longitudinal strain have fallen with initiation of Herceptin. EF down to 45-50% on 1/17 echo.  I suspect Herceptin is the cause.  She has been on imatinib long-term (rarely can cause CHF).  She has had no chest pain or other suggestion of MI/cardiac ischemia.  She is not volume overloaded on exam and does not have significant dyspnea.  - Stop Herceptin for the time being.  - Continue valsartan.  - Stop diltiazem CD and start Coreg 12.5 mg bid.  - Repeat echo in 6 wks => if EF has recovered, would consider re-challenge with Herceptin.  2. HTN: Replacing Diltiazem with Coreg, which may be better for her BP control.   Loralie House 10/02/2015

## 2015-10-02 NOTE — Patient Instructions (Signed)
Stop Diltiazem  Start Carvedilol 12.5 mg Twice daily   Hold Herceptin treatments for now  Your physician recommends that you schedule a follow-up appointment in: 6 weeks with echocardiogram

## 2015-10-03 ENCOUNTER — Other Ambulatory Visit: Payer: Self-pay | Admitting: Oncology

## 2015-10-06 ENCOUNTER — Other Ambulatory Visit: Payer: Self-pay | Admitting: Oncology

## 2015-10-07 ENCOUNTER — Other Ambulatory Visit: Payer: Self-pay | Admitting: Nurse Practitioner

## 2015-10-07 ENCOUNTER — Telehealth: Payer: Self-pay | Admitting: *Deleted

## 2015-10-07 NOTE — Telephone Encounter (Signed)
Call Documentation      Harmon Pier, LPN at 11/24/9700 6:37 PM     Status: Signed       Expand All Collapse All   Called pt's daughter to inform that her mom needs to come to next appt on Feb.7 for labs and Dr. Jana Hakim, but we will cancel the appt on the 28th because she will have to have another Echo in March to determine whether we will continue with the Herceptin. Daughter, [place] verbalized understanding. Message to be fwd to Gentry Fitz, NP

## 2015-10-07 NOTE — Telephone Encounter (Signed)
"  Does mom need to keep te appointment October 21, 2015.  She was told her heart is too weak for chemotherapy.  Next Echo is in March?  Call me at [phone]."

## 2015-10-07 NOTE — Telephone Encounter (Signed)
Called pt's daughter to inform that her mom needs  to come to next appt on Feb.7 for labs and Dr. Jana Hakim, but we will cancel the appt on the 28th because she will have to have another Echo in March to determine whether we will continue with the Herceptin. Daughter, [place] verbalized understanding. Message to be fwd to Gentry Fitz, NP

## 2015-10-08 NOTE — Telephone Encounter (Signed)
Patient called asking what time she needs to be here on 10-21-2015.  10:30 AM lab time provided.  Asked when she will see The doctor.  Appointment will follow lab at 11:00 for MD.

## 2015-10-21 ENCOUNTER — Ambulatory Visit: Payer: Medicare Other

## 2015-10-21 ENCOUNTER — Ambulatory Visit (HOSPITAL_BASED_OUTPATIENT_CLINIC_OR_DEPARTMENT_OTHER): Payer: Medicare Other

## 2015-10-21 ENCOUNTER — Other Ambulatory Visit (HOSPITAL_BASED_OUTPATIENT_CLINIC_OR_DEPARTMENT_OTHER): Payer: Medicare Other

## 2015-10-21 ENCOUNTER — Ambulatory Visit (HOSPITAL_BASED_OUTPATIENT_CLINIC_OR_DEPARTMENT_OTHER): Payer: Medicare Other | Admitting: Oncology

## 2015-10-21 ENCOUNTER — Telehealth: Payer: Self-pay | Admitting: Oncology

## 2015-10-21 VITALS — BP 141/58 | HR 65 | Temp 97.9°F | Resp 18 | Ht 63.0 in | Wt 128.9 lb

## 2015-10-21 DIAGNOSIS — R300 Dysuria: Secondary | ICD-10-CM

## 2015-10-21 DIAGNOSIS — J849 Interstitial pulmonary disease, unspecified: Secondary | ICD-10-CM

## 2015-10-21 DIAGNOSIS — C9210 Chronic myeloid leukemia, BCR/ABL-positive, not having achieved remission: Secondary | ICD-10-CM

## 2015-10-21 DIAGNOSIS — I427 Cardiomyopathy due to drug and external agent: Secondary | ICD-10-CM | POA: Diagnosis not present

## 2015-10-21 DIAGNOSIS — C50412 Malignant neoplasm of upper-outer quadrant of left female breast: Secondary | ICD-10-CM | POA: Diagnosis not present

## 2015-10-21 DIAGNOSIS — T451X5A Adverse effect of antineoplastic and immunosuppressive drugs, initial encounter: Secondary | ICD-10-CM

## 2015-10-21 DIAGNOSIS — C50912 Malignant neoplasm of unspecified site of left female breast: Secondary | ICD-10-CM

## 2015-10-21 LAB — CBC WITH DIFFERENTIAL/PLATELET
BASO%: 0.6 % (ref 0.0–2.0)
BASOS ABS: 0 10*3/uL (ref 0.0–0.1)
EOS ABS: 0.3 10*3/uL (ref 0.0–0.5)
EOS%: 8.7 % — ABNORMAL HIGH (ref 0.0–7.0)
HCT: 35.5 % (ref 34.8–46.6)
HEMOGLOBIN: 11.7 g/dL (ref 11.6–15.9)
LYMPH#: 1.6 10*3/uL (ref 0.9–3.3)
LYMPH%: 44.8 % (ref 14.0–49.7)
MCH: 29.9 pg (ref 25.1–34.0)
MCHC: 33 g/dL (ref 31.5–36.0)
MCV: 90.8 fL (ref 79.5–101.0)
MONO#: 0.2 10*3/uL (ref 0.1–0.9)
MONO%: 6.5 % (ref 0.0–14.0)
NEUT%: 39.4 % (ref 38.4–76.8)
NEUTROS ABS: 1.4 10*3/uL — AB (ref 1.5–6.5)
PLATELETS: 247 10*3/uL (ref 145–400)
RBC: 3.91 10*6/uL (ref 3.70–5.45)
RDW: 14.9 % — AB (ref 11.2–14.5)
WBC: 3.6 10*3/uL — AB (ref 3.9–10.3)

## 2015-10-21 LAB — COMPREHENSIVE METABOLIC PANEL
ALT: 17 U/L (ref 0–55)
ANION GAP: 9 meq/L (ref 3–11)
AST: 25 U/L (ref 5–34)
Albumin: 3.7 g/dL (ref 3.5–5.0)
Alkaline Phosphatase: 92 U/L (ref 40–150)
BILIRUBIN TOTAL: 0.4 mg/dL (ref 0.20–1.20)
BUN: 8.8 mg/dL (ref 7.0–26.0)
CHLORIDE: 106 meq/L (ref 98–109)
CO2: 22 meq/L (ref 22–29)
CREATININE: 1.1 mg/dL (ref 0.6–1.1)
Calcium: 9.2 mg/dL (ref 8.4–10.4)
EGFR: 57 mL/min/{1.73_m2} — ABNORMAL LOW (ref >90)
GLUCOSE: 111 mg/dL (ref 70–140)
Potassium: 4 mEq/L (ref 3.5–5.1)
SODIUM: 137 meq/L (ref 136–145)
TOTAL PROTEIN: 7.1 g/dL (ref 6.4–8.3)

## 2015-10-21 LAB — URINALYSIS, MICROSCOPIC - CHCC
BILIRUBIN (URINE): NEGATIVE
BLOOD: NEGATIVE
Glucose: NEGATIVE mg/dL
Ketones: NEGATIVE mg/dL
LEUKOCYTE ESTERASE: NEGATIVE
NITRITE: NEGATIVE
Protein: NEGATIVE mg/dL
SPECIFIC GRAVITY, URINE: 1.01 (ref 1.003–1.035)
UROBILINOGEN UR: 0.2 mg/dL (ref 0.2–1)
pH: 5 (ref 4.6–8.0)

## 2015-10-21 NOTE — Telephone Encounter (Signed)
Added herc for 3/30 after f/u per 1st 2/7 pof. Patient dtr given new avs report.

## 2015-10-21 NOTE — Telephone Encounter (Signed)
Patient sent back to lab and given avs report and appointments for March.

## 2015-10-21 NOTE — Progress Notes (Signed)
Pamela House  Telephone:([phone]) [phone] Fax:([phone]) [phone]     ID: Pamela House DOB: 1930/03/18  MR#: [id number]  CWC#:[id number]  Patient Care Team: Pamela Low, MD as PCP - General (Internal Medicine) Pamela Cruel, MD as Consulting Physician (Oncology) Pamela Messing III, MD as Consulting Physician (General Surgery) PCP: Pamela Low, MD GYN: OTHER MD:  CHIEF COMPLAINT: HER-2 positive early stage breast cancer; chronic myeloid leukemia  CURRENT TREATMENT: anti-HER2 therapy  BREAST CANCER HISTORY: From the original intake note:  Pamela House has a history of chronic myeloid leukemia followed by Dr. Earlie House. She is treated with imatinib and her most recent BCR.ABL/ABL ratio was nearly unmeasurable.   On 12/25/2014 the patient had routine bilateral screening mammography at the breast Center, showing a breast density category B. There was a possible mass in the left breast and the patient was recalled for left diagnostic mammography with tomosynthesis and left breast ultrasonography 01/02/2015. This showed an 8 mm mass with indistinct margins in the posterior third of the outer left breast, which was palpable at the 2:30 position 8 cm from the nipple. Ultrasound confirmed an oval solid mass measuring 8 mm. Ultrasound of the left axilla showed no abnormal lymphadenopathy.  Biopsy of the left breast mass in question 01/23/2015 showed ([id number]) an invasive ductal carcinoma, grade 3, estrogen and progesterone receptor negative, but with HER-2 amplification, the signals ratio being 2.38, and the number per cell 3.45. MIB-1 was 79%.  The patient's case was presented at the multidisciplinary breast cancer conference 01/29/2015. At that time it was felt that breast conserving surgery would be the first step, to be followed by adjuvant chemotherapy and anti-HER-2 immunotherapy.  The patient's subsequent history is as detailed below  INTERVAL HISTORY:  Pamela House returns for  follow-up of her breast cancer, accompanied by her daughter Pamela House. After her December Herceptin treatment she had a repeat echocardiogram which showed a drop in her ejection fraction to between 45 and 50%, with global hypokinesis. She was evaluated by Dr. Benjamine House and started on a beta blocker. Her Herceptin has been put on hold for now.  She continues on imatinib daily. She tolerates that with no side effects that she is aware of. Her last BCR ABL testing was 6 months ago.  REVIEW OF SYSTEMS:  Pamela House tells me she developed pneumonia in January and was treated with 2 courses of antibiotics. She now has very little cough. She denies pleurisy or worsening shortness of breath. Since started on the antibiotics she has developed dysuria. There has been no hematuria that she is aware of. She does have occasional nosebleeds. Her dentures do not fit well. She has a little bit of a sore throat. Sometimes she has abdominal cramps. She feels forgetful and depressed. She is not very active at home. She still has soreness in the left armpit area from the breast surgery. A detailed review of systems today was otherwise noncontributory.  PAST MEDICAL HISTORY: Past Medical History  Diagnosis Date  . Dyslipidemia   . GERD (gastroesophageal reflux disease)   . Dementia   . Anxiety   . Allergic rhinitis   . Hypertension   . DDD (degenerative disc disease)   . Osteoporosis   . Vitamin D deficiency   . Leukemia ([place]) 2010  . Radiation 04/30/15-05/1515    Left breast    PAST SURGICAL HISTORY: Past Surgical History  Procedure Laterality Date  . Back surgery  1984  . Breast lumpectomy with radioactive seed and sentinel lymph node  biopsy Left 02/27/2015    Procedure: LEFT BREAST LUMPECTOMY WITH RADIOACTIVE SEED AND SENTINEL LYMPH NODE BIOPSY;  Surgeon: Pamela Messing III, MD;  Location: [facility];  Service: General;  Laterality: Left;    FAMILY HISTORY No family history on file. The patient's father  died at the age of 31. The patient's mother died in her 78H from complications of diabetes. The patient had one full brother and one full sister there this fall sister had ovarian cancer diagnosed late in life. The patient had 14 additional half siblings. There is no other history of breast cancer in the family.  GYNECOLOGIC HISTORY:  No LMP recorded. Patient has had a hysterectomy. Menarche age 6, first live birth age 89. She is GX P5. [name] is not sure when she went through the change of life, but she had a hysterectomy remotely. She is not sure whether the ovaries were removed. She did not take hormone replacement.  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 Her husband died in Nov 05, 1999 and she lives by herself, with no pets, in the [employer] school apartments off some. There are exercise groups there but not a gym. Her daughter Pamela House is Agricultural consultant for a shipping company in [place]. Granddaughter Pamela House (pronounced "Trey Paula") is a Education officer, museum for the child protective services in [place]. The patient has 2 sons who have died. She attends a [employer].    ADVANCED DIRECTIVES: Her daughter Pamela House is her healthcare power of attorney. She can be reached at [phone].   HEALTH MAINTENANCE: Social History  Substance Use Topics  . Smoking status: Never Smoker   . Smokeless tobacco: Not on file  . Alcohol Use: No     Colonoscopy:  PAP:  Bone density:  Lipid panel:  Allergies  Allergen Reactions  . Ace Inhibitors Cough  . Hyzaar [Losartan Potassium-Hctz] Cough  . Lipitor [Atorvastatin]     Muscle fatigue  . Losartan Cough  . Tiazac [Diltiazem Hcl Er Beads] Cough    Current Outpatient Prescriptions  Medication Sig Dispense Refill  . ALPRAZolam (XANAX) 0.5 MG tablet Take 0.5 mg by mouth 2 (two) times daily as needed for anxiety.     Marland Kitchen aspirin 81 MG tablet Take 81 mg by mouth daily. 2-3  times weekly    . carvedilol (COREG) 12.5 MG tablet Take 1 tablet (12.5 mg total) by mouth 2 (two) times daily. 60 tablet 3  . diphenoxylate-atropine (LOMOTIL) 2.5-0.025 MG per tablet Reported on 09/09/2015  0  . donepezil (ARICEPT) 5 MG tablet Take 5 mg by mouth at bedtime.    . ergocalciferol (VITAMIN D2) 50000 UNITS capsule Take 50,000 Units by mouth every 30 (thirty) days.    . fluticasone (FLONASE) 50 MCG/ACT nasal spray Place 1 spray into both nostrils daily as needed for allergies or rhinitis. Reported on 09/09/2015    . furosemide (LASIX) 20 MG tablet Take 20 mg by mouth as needed.    Marland Kitchen guaiFENesin-dextromethorphan (ROBITUSSIN DM) 100-10 MG/5ML syrup Take 5 mLs by mouth every 4 (four) hours as needed for cough. Reported on 10/02/2015    . imatinib (GLEEVEC) 400 MG tablet Take 1 tablet (400 mg total) by mouth See admin instructions. Take with meals and large glass of water.Caution:Chemotherapy. Takes on Tues, thurs, sat, sun 30 tablet 1  . levofloxacin (LEVAQUIN) 500 MG tablet Take 1 tablet (500 mg total) by mouth daily. 5 tablet 0  .  omeprazole (PRILOSEC) 20 MG capsule Take 20 mg by mouth 2 (two) times daily before a meal.     . potassium chloride (K-DUR,KLOR-CON) 10 MEQ tablet Take 10 mEq by mouth as needed (with lasix.).    Marland Kitchen Probiotic Product (ALIGN PO) Take by mouth.    . psyllium (METAMUCIL) 58.6 % powder Take 1 packet by mouth 3 (three) times daily. Reported on 09/09/2015    . valsartan (DIOVAN) 160 MG tablet Take 160 mg by mouth daily.      No current facility-administered medications for this visit.    OBJECTIVE: Older Black woman in no acute distress Filed Vitals:   10/21/15 1044  BP: 141/58  Pulse: 65  Temp: 97.9 F (36.6 C)  Resp: 18     Body mass index is 22.84 kg/(m^2).    ECOG FS:1 - Symptomatic but completely ambulatory  Sclerae unicteric, EOMs intact Oropharynx clear, edentulous No cervical or supraclavicular adenopathy Lungs no rales or rhonchi Heart regular  rate and rhythm Abd soft, nontender, positive bowel sounds MSK no upper extremity lymphedema Neuro: nonfocal, well oriented, appropriate affect Breasts: The right breast is unremarkable. The left breast is status post lumpectomy and radiation. There is no evidence of local recurrence. The left axilla is benign.   LAB RESULTS:  CMP     Component Value Date/Time   NA 137 09/09/2015 1353   NA 143 02/26/2015 1231   K 4.1 09/09/2015 1353   K 3.6 02/26/2015 1231   CL 105 02/26/2015 1231   CL 106 02/21/2013 1355   CO2 23 09/09/2015 1353   CO2 24 06/12/2014 2040   GLUCOSE 96 09/09/2015 1353   GLUCOSE 105* 02/26/2015 1231   GLUCOSE 92 02/21/2013 1355   BUN 14.2 09/09/2015 1353   BUN 15 02/26/2015 1231   CREATININE 0.9 09/09/2015 1353   CREATININE 0.90 02/26/2015 1231   CALCIUM 9.7 09/09/2015 1353   CALCIUM 9.7 06/12/2014 2040   PROT 7.4 09/09/2015 1353   PROT 8.4* 06/12/2014 2040   ALBUMIN 4.0 09/09/2015 1353   ALBUMIN 4.3 06/12/2014 2040   AST 23 09/09/2015 1353   AST 38* 06/12/2014 2040   ALT 14 09/09/2015 1353   ALT 15 06/12/2014 2040   ALKPHOS 93 09/09/2015 1353   ALKPHOS 106 06/12/2014 2040   BILITOT <0.30 09/09/2015 1353   BILITOT 0.4 06/12/2014 2040   GFRNONAA 80* 06/12/2014 2040   GFRAA >90 06/12/2014 2040    INo results found for: SPEP, UPEP  Lab Results  Component Value Date   WBC 3.6* 10/21/2015   NEUTROABS 1.4* 10/21/2015   HGB 11.7 10/21/2015   HCT 35.5 10/21/2015   MCV 90.8 10/21/2015   PLT 247 10/21/2015      Chemistry      Component Value Date/Time   NA 137 09/09/2015 1353   NA 143 02/26/2015 1231   K 4.1 09/09/2015 1353   K 3.6 02/26/2015 1231   CL 105 02/26/2015 1231   CL 106 02/21/2013 1355   CO2 23 09/09/2015 1353   CO2 24 06/12/2014 2040   BUN 14.2 09/09/2015 1353   BUN 15 02/26/2015 1231   CREATININE 0.9 09/09/2015 1353   CREATININE 0.90 02/26/2015 1231      Component Value Date/Time   CALCIUM 9.7 09/09/2015 1353   CALCIUM 9.7  06/12/2014 2040   ALKPHOS 93 09/09/2015 1353   ALKPHOS 106 06/12/2014 2040   AST 23 09/09/2015 1353   AST 38* 06/12/2014 2040   ALT 14 09/09/2015 1353  ALT 15 06/12/2014 2040   BILITOT <0.30 09/09/2015 1353   BILITOT 0.4 06/12/2014 2040       No results found for: LABCA2  No components found for: LABCA125  No results for input(s): INR in the last 168 hours.  Urinalysis    Component Value Date/Time   COLORURINE YELLOW 06/12/2014 2231   APPEARANCEUR CLEAR 06/12/2014 2231   LABSPEC 1.010 06/20/2015 1529   LABSPEC 1.007 06/12/2014 2231   PHURINE 6.0 06/20/2015 1529   PHURINE 6.5 06/12/2014 2231   GLUCOSEU Negative 06/20/2015 1529   GLUCOSEU NEGATIVE 06/12/2014 2231   HGBUR Negative 06/20/2015 1529   HGBUR NEGATIVE 06/12/2014 2231   BILIRUBINUR Negative 06/20/2015 1529   BILIRUBINUR NEGATIVE 06/12/2014 2231   KETONESUR Negative 06/20/2015 1529   KETONESUR NEGATIVE 06/12/2014 2231   PROTEINUR Negative 06/20/2015 1529   PROTEINUR NEGATIVE 06/12/2014 2231   UROBILINOGEN 0.2 06/20/2015 1529   UROBILINOGEN 0.2 06/12/2014 2231   NITRITE Negative 06/20/2015 1529   NITRITE NEGATIVE 06/12/2014 2231   LEUKOCYTESUR Trace 06/20/2015 1529   LEUKOCYTESUR NEGATIVE 06/12/2014 2231    STUDIES:   Interpretation Summary     CLINICAL DATA: Breast cancer. Cough. Shortness of breath.  EXAM: CHEST 2 VIEW  COMPARISON: 06/12/2014  FINDINGS: Bilateral interstitial accentuation especially in the periphery of the lungs. Moderate enlargement of the cardiopericardial silhouette with tortuosity of the thoracic aorta and atherosclerotic calcification of the aortic arch.  There is spondylosis and degenerative disc disease at the thoracolumbar junction, similar to prior. Suspected chronic superior endplate compression at T11, similar to prior. No new fracture identified.  Clips project over the left axilla.  IMPRESSION: 1. Moderate enlargement of the cardiopericardial  silhouette with bilateral interstitial opacity most confluent and reticular in the lung periphery. This represents a moderate worsening from prior and probably reflects pulmonary edema -resolving edema can sometimes concentrated in the lung periphery. Usual interstitial pneumonia is a less likely differential diagnostic consideration given the faint reticular nature of the interstitial accentuation.   Electronically Signed  By: Van Clines M.D.  On: 09/15/2015 16:18     ASSESSMENT: 82 y.o. [place] woman status post left breast biopsy 01/23/2015 for a clinical T1b N0, stage IA invasive ductal carcinoma, grade 3, estrogen and progesterone receptor negative, but HER-2 amplified, with an MIB-1 of 79%  (1) genetics testing requested--patient with breast cancer, family history of ovarian cancer  (2) left lumpectomy with sentinel lymph node biopsy on 02/27/2015  (3) adjuvant anti-HER-2 immunotherapy with trastuzumab every 3 weeks through 1 year, started 03/28/15. Patient declined chemotherapy (abraxane).  (a) echocardiogram 09/29/2015 showed an ejection fraction of 45-50%, with diffuse hypokinesis. Herceptin interrupted after 09/09/2015 dose  (4) radiation 04/30/15-05/29/15: Left breast/ 42.72 Gy at 2.67 Gy per fraction x 21 fractions.  Left breast boost/ 10 Gy at 2 Gy per fraction x 5 fractions  (5) history of chronic myeloid leukemia, continues on imatinib/Gleevec 400 mg daily  (a) bcr.abl / abl ratio 0.00013 as of 05/09/2015.  PLAN: Suhey is very stable from a breast cancer point of view and the plan will be to resume Herceptin if and when her heart sufficiently recovers. We will let the cardio oncologists guide [place] in that regard.  She continues on imatinib with good tolerance. We will check a BCR ABL level at her next visit.  She warrants genetic testing. Possibly this could be done the same day she returns to see [place] in March. This was discussed with the patient and her  daughter today.  She will  see [place] again in 12/11/2015. Hopefully we can resume trastuzumab at that time. Otherwise we may consider initiating long-term follow-up then.  I am obtaining a urinalysis today to further evaluate Levenia's persistent dysuria.  Pamela Cruel, MD   10/21/2015 10:50 AM

## 2015-10-22 ENCOUNTER — Ambulatory Visit
Admission: RE | Admit: 2015-10-22 | Discharge: 2015-10-22 | Disposition: A | Discharge status: Home / Self Care | Payer: Medicare Other | Source: Ambulatory Visit | Attending: Internal Medicine | Admitting: Internal Medicine

## 2015-10-22 ENCOUNTER — Other Ambulatory Visit: Payer: Self-pay | Admitting: Internal Medicine

## 2015-10-22 DIAGNOSIS — J189 Pneumonia, unspecified organism: Secondary | ICD-10-CM

## 2015-10-22 LAB — URINE CULTURE

## 2015-11-10 ENCOUNTER — Telehealth: Payer: Self-pay | Admitting: *Deleted

## 2015-11-10 ENCOUNTER — Encounter: Payer: Self-pay | Admitting: *Deleted

## 2015-11-10 NOTE — Telephone Encounter (Addendum)
"  You all completed FMLA for my mom but I still need a note to take to work.  I have been out of work taking care of my mom Friday and today.  She has a cough."  Offered [facility].  Denies need to be seen stating "she has a lung disease robitussin doesn't help with.  I can pick this note up within the hour."

## 2015-11-11 ENCOUNTER — Ambulatory Visit: Payer: Medicare Other

## 2015-11-11 ENCOUNTER — Other Ambulatory Visit: Payer: Medicare Other

## 2015-11-11 NOTE — Telephone Encounter (Signed)
Letter completed.

## 2015-11-18 ENCOUNTER — Ambulatory Visit (HOSPITAL_COMMUNITY)
Admission: RE | Admit: 2015-11-18 | Discharge: 2015-11-18 | Disposition: A | Discharge status: Home / Self Care | Payer: Medicare Other | Source: Ambulatory Visit | Attending: Internal Medicine | Admitting: Internal Medicine

## 2015-11-18 ENCOUNTER — Encounter (HOSPITAL_COMMUNITY): Payer: Self-pay | Admitting: *Deleted

## 2015-11-18 ENCOUNTER — Ambulatory Visit (HOSPITAL_BASED_OUTPATIENT_CLINIC_OR_DEPARTMENT_OTHER)
Admission: RE | Admit: 2015-11-18 | Discharge: 2015-11-18 | Disposition: A | Discharge status: Home / Self Care | Payer: Medicare Other | Source: Ambulatory Visit | Attending: Cardiology | Admitting: Cardiology

## 2015-11-18 VITALS — BP 162/76 | HR 81 | Wt 122.5 lb

## 2015-11-18 DIAGNOSIS — I340 Nonrheumatic mitral (valve) insufficiency: Secondary | ICD-10-CM | POA: Diagnosis not present

## 2015-11-18 DIAGNOSIS — C50912 Malignant neoplasm of unspecified site of left female breast: Secondary | ICD-10-CM

## 2015-11-18 DIAGNOSIS — I119 Hypertensive heart disease without heart failure: Secondary | ICD-10-CM | POA: Insufficient documentation

## 2015-11-18 DIAGNOSIS — J849 Interstitial pulmonary disease, unspecified: Secondary | ICD-10-CM | POA: Diagnosis not present

## 2015-11-18 DIAGNOSIS — I10 Essential (primary) hypertension: Secondary | ICD-10-CM | POA: Diagnosis not present

## 2015-11-18 DIAGNOSIS — I427 Cardiomyopathy due to drug and external agent: Secondary | ICD-10-CM | POA: Insufficient documentation

## 2015-11-18 DIAGNOSIS — T451X5A Adverse effect of antineoplastic and immunosuppressive drugs, initial encounter: Secondary | ICD-10-CM

## 2015-11-18 DIAGNOSIS — C50922 Malignant neoplasm of unspecified site of left male breast: Secondary | ICD-10-CM

## 2015-11-18 DIAGNOSIS — E785 Hyperlipidemia, unspecified: Secondary | ICD-10-CM | POA: Diagnosis not present

## 2015-11-18 MED ORDER — CARVEDILOL 25 MG PO TABS: 25.0000 mg | ORAL_TABLET | Freq: Two times a day (BID) | ORAL | Status: DC

## 2015-11-18 NOTE — Progress Notes (Signed)
  Echocardiogram 2D Echocardiogram has been performed.  Pamela House 11/18/2015, 3:07 PM

## 2015-11-18 NOTE — Patient Instructions (Signed)
INCREASE Coreg to 25 mg, one tab twice a day  Your physician has requested that you have an echocardiogram. Echocardiography is a painless test that uses sound waves to create images of your heart. It provides your doctor with information about the size and shape of your heart and how well your heart's chambers and valves are working. This procedure takes approximately one hour. There are no restrictions for this procedure.  Your physician recommends that you schedule a follow-up appointment in: 6 weeks with echo

## 2015-11-19 NOTE — Progress Notes (Signed)
Patient ID: Pamela House, female   DOB: [birth year], 82 y.o.   MRN: [id number] PCP: Dr. Lysle Rubens Oncologist: Dr. Jana Hakim (breast CA), Dr Earlie Server ([place])  82 yo with history of CML, breast cancer, HTN, and mild-moderate dementia presents for cardio-oncology evaluation given fall in EF while getting Herceptin therapy.  Patient has CML and has been on imatinib for an extended period.  In 5/16 she was found to have breast cancer, ER-/PR-/HER2+.  She had a lumpectomy in 6/16 and declined chemotherapy.  She has been getting Herceptin every 3 wks to be continued to 7/17.  Initial echo in 6/16 showed normal LV function.  Echo in 10/16 showed a mild fall in EF.  Echo in 1/17 has showed a more significant fall in EF to 45-50% along with a corresponding drop in global longitudinal strain.   Patient does not seem to have a significant cardiac history.  She says she had a remote catheterization but does not remember any of the details of this.  She was seen 09/15/15 in the ER and thought to have LLL PNA.  Main symptom was cough.    At last appointment, she was started on Coreg and Herceptin was held.  She returns today for repeat echo off Herceptin.  This shows EF about 45% with global longitudinal strain remaining low.  Her main complaint remains a chronic cough.  She is short of breath when she coughs.  She is going to be seen by pulmonology.  No chest pain.  No orthopnea/PND.  Able to walk in today without problems.  Of note, CXR showed interstitial fibrosis and a prior CT chest showed interstitial fibrosis pattern.    Labs (12/16): K 4.1, creatinine 0.9 Labs (2/17): K 4, creatinine 1.1  PMH: 1. Hyperlipidemia: Myalgias with Lipitor.  2. Mild-moderate dementia 3. HTN 4. Degenerative disc disease 5. GERD 6. CML: She has been on imatinib.  Followed by Dr. Earlie Server.  7. Breast cancer: Diagnosed in 5/16.  ER-/PR-/HER2+.  Lumpectomy 6/16, declined chemotherapy. She is getting Herceptin every 3 wks to be continued  to 7/17.  - Echo (6/16): EF 55-60%, GLS -19%. - Echo (10/16): EF 50-55%, GLS -21.9%. - Echo (1/17): EF 45-50%, GLS -15.7%.  - Echo  (2/17): EF 45%, GLS -12.1%.  8. Interstitial fibrosis: Noted on prior CT and CXR.   SH: Lives alone, 1 living daughter, nonsmoker.   FH: Ovarian cancer  ROS: All systems reviewed and negative except as per HPI.  Current Outpatient Prescriptions  Medication Sig Dispense Refill  . ALPRAZolam (XANAX) 0.5 MG tablet Take 0.5 mg by mouth 2 (two) times daily as needed for anxiety.     . carvedilol (COREG) 25 MG tablet Take 1 tablet (25 mg total) by mouth 2 (two) times daily. 60 tablet 6  . diphenoxylate-atropine (LOMOTIL) 2.5-0.025 MG per tablet Reported on 09/09/2015  0  . donepezil (ARICEPT) 5 MG tablet Take 5 mg by mouth at bedtime.    . fluticasone (FLONASE) 50 MCG/ACT nasal spray Place 1 spray into both nostrils daily as needed for allergies or rhinitis. Reported on 09/09/2015    . furosemide (LASIX) 20 MG tablet Take 20 mg by mouth as needed.    . imatinib (GLEEVEC) 400 MG tablet Take 1 tablet (400 mg total) by mouth See admin instructions. Take with meals and large glass of water.Caution:Chemotherapy. Takes on Tues, thurs, sat, sun 30 tablet 1  . omeprazole (PRILOSEC) 20 MG capsule Take 20 mg by mouth 2 (two) times daily before  a meal.     . potassium chloride (K-DUR,KLOR-CON) 10 MEQ tablet Take 10 mEq by mouth as needed (with lasix.).    Marland Kitchen Probiotic Product (ALIGN PO) Take by mouth.    . promethazine (PHENERGAN) 25 MG tablet Take 25 mg by mouth 4 (four) times daily.    . psyllium (METAMUCIL) 58.6 % powder Take 1 packet by mouth 3 (three) times daily. Reported on 09/09/2015    . valsartan (DIOVAN) 160 MG tablet Take 160 mg by mouth daily.      No current facility-administered medications for this encounter.   BP 162/76 mmHg  Pulse 81  Wt 122 lb 8 oz (55.566 kg)  SpO2 92% General: NAD Neck: No JVD, no thyromegaly or thyroid nodule.  Lungs: Dry  crackles at bases bilaterally. CV: Nondisplaced PMI.  Heart regular S1/S2, no S3/S4, 1/6 SEM RUSB.  No peripheral edema.  No carotid bruit.  Normal pedal pulses.  Abdomen: Soft, nontender, no hepatosplenomegaly, no distention.  Skin: Intact without lesions or rashes.  Neurologic: Alert and oriented x 3.  Psych: Normal affect. Extremities: No clubbing or cyanosis.  HEENT: Normal.   Assessment/Plan: 1. Cardiomyopathy: EF and global longitudinal strain have fallen with initiation of Herceptin. EF down to 45-50% on 1/17 echo and Herceptin was held.  Today, EF about 45% with lower strain.  I suspect Herceptin is the cause.  She has been on imatinib long-term (rarely can cause CHF).  She has had no chest pain or other suggestion of MI/cardiac ischemia.  She is not volume overloaded on exam.  - Would stay off Herceptin.  - Continue valsartan.  - Increase Coreg to 25 mg bid.  - Repeat echo again in 6 wks => if EF has recovered, would consider re-challenge with Herceptin.  2. HTN: BP remains high, increasing Coreg. 3. Cough: This is very bothersome.  Looking at her CXR, this is probably from interstitial lung disease.  She says that she has an appointment with pulmonary, which would be the next step.    [name] 11/19/2015

## 2015-11-26 ENCOUNTER — Encounter: Payer: Self-pay | Admitting: Pulmonary Disease

## 2015-11-26 ENCOUNTER — Ambulatory Visit (INDEPENDENT_AMBULATORY_CARE_PROVIDER_SITE_OTHER): Payer: Medicare Other | Admitting: Pulmonary Disease

## 2015-11-26 VITALS — BP 140/68 | HR 66 | Ht 63.0 in | Wt 125.6 lb

## 2015-11-26 DIAGNOSIS — J849 Interstitial pulmonary disease, unspecified: Secondary | ICD-10-CM | POA: Diagnosis not present

## 2015-11-26 MED ORDER — OMEPRAZOLE 40 MG PO CPDR: 40.0000 mg | DELAYED_RELEASE_CAPSULE | Freq: Two times a day (BID) | ORAL | Status: DC

## 2015-11-26 MED ORDER — OMEPRAZOLE 20 MG PO CPDR: 20.0000 mg | DELAYED_RELEASE_CAPSULE | Freq: Two times a day (BID) | ORAL | Status: DC

## 2015-11-26 MED ORDER — ALBUTEROL SULFATE HFA 108 (90 BASE) MCG/ACT IN AERS: 2.0000 | INHALATION_SPRAY | Freq: Four times a day (QID) | RESPIRATORY_TRACT | Status: DC | PRN

## 2015-11-26 MED ORDER — HYDROCOD POLST-CPM POLST ER 10-8 MG/5ML PO SUER: 5.0000 mL | Freq: Two times a day (BID) | ORAL | Status: DC | PRN

## 2015-11-26 NOTE — Patient Instructions (Addendum)
Increase her Protonix dose to 40 mg twice daily. We'll start you on loratadine for allergies and tussionex for cough. Continue Flonase nasal spray. You 'll be scheduled for a CT high resolution of the chest and blood tests for workup of interstitial lung disease. We'll also give you an albuterol rescue inhaler to be used as needed.  Return to clinic in 3 months

## 2015-11-26 NOTE — Progress Notes (Signed)
Subjective:    Patient ID: Pamela House, female    DOB: May 09, 1930, 82 y.o.   MRN: [id number]  HPI Consult for evaluation of cough, interstitial lung disease.  Pamela House is a 82 year old with past medical history of CML, on Gleevec for many years. She has a diagnosis of breast cancer in 2016, T1b N0, stage IA invasive ductal carcinoma, grade 3, estrogen and progesterone receptor negative, HER-2 +. She underwent left lumpectomy with lymph node biopsy on 6/16 and treated with herceptin. She was recently evaluated by cardiology for reduced EF. Herceptin was held and Coreg increased.  She was evaluated in 2014 by Dr. Lamonte Sakai for chronic cough. This is felt to be due to upper airway syndrome from post nasal drip, GERD. She had a CT at that time which showed and is happy fibrosis. Repeat x-rays this year showed probable progression of disease. Her cough continues to bother her.   DATA: CT chest 05/21/13 Images reviewed. Primary fibrosis in NSIP pattern.  CXR 10/22/15 Probable progression of interstitial lung disease. No definite active process.  Social History: She is a never smoker,. No drug, alcohol use. No known inhalational exposures.  Family History: Ovarian cancer.  Past Medical History  Diagnosis Date  . Dyslipidemia   . GERD (gastroesophageal reflux disease)   . Dementia   . Anxiety   . Allergic rhinitis   . Hypertension   . DDD (degenerative disc disease)   . Osteoporosis   . Vitamin D deficiency   . Leukemia ([place]) 2010  . Radiation 04/30/15-05/1515    Left breast    Current outpatient prescriptions:  .  ALPRAZolam (XANAX) 0.5 MG tablet, Take 0.5 mg by mouth 2 (two) times daily as needed for anxiety. , Disp: , Rfl:  .  carvedilol (COREG) 25 MG tablet, Take 1 tablet (25 mg total) by mouth 2 (two) times daily., Disp: 60 tablet, Rfl: 6 .  diphenoxylate-atropine (LOMOTIL) 2.5-0.025 MG per tablet, Reported on 09/09/2015, Disp: , Rfl: 0 .  donepezil (ARICEPT) 5 MG tablet,  Take 5 mg by mouth at bedtime., Disp: , Rfl:  .  fluticasone (FLONASE) 50 MCG/ACT nasal spray, Place 1 spray into both nostrils daily as needed for allergies or rhinitis. Reported on 09/09/2015, Disp: , Rfl:  .  furosemide (LASIX) 20 MG tablet, Take 20 mg by mouth as needed., Disp: , Rfl:  .  imatinib (GLEEVEC) 400 MG tablet, Take 1 tablet (400 mg total) by mouth See admin instructions. Take with meals and large glass of water.Caution:Chemotherapy. Takes on Tues, thurs, sat, sun, Disp: 30 tablet, Rfl: 1 .  omeprazole (PRILOSEC) 20 MG capsule, Take 20 mg by mouth 2 (two) times daily before a meal. , Disp: , Rfl:  .  potassium chloride (K-DUR,KLOR-CON) 10 MEQ tablet, Take 10 mEq by mouth as needed (with lasix.)., Disp: , Rfl:  .  Probiotic Product (ALIGN PO), Take by mouth., Disp: , Rfl:  .  promethazine (PHENERGAN) 25 MG tablet, Take 25 mg by mouth 4 (four) times daily., Disp: , Rfl:  .  psyllium (METAMUCIL) 58.6 % powder, Take 1 packet by mouth 3 (three) times daily. Reported on 09/09/2015, Disp: , Rfl:  .  valsartan (DIOVAN) 160 MG tablet, Take 160 mg by mouth daily. , Disp: , Rfl:   Review of Systems  Nonproductive cough, dyspnea, wheezing. No fevers, chills, loss of weight,appetite. No chest pain, palpitations. No nausea, vomiting or diarrhea, consultation. All other review systems are negative    Objective:  Physical Exam Blood pressure 140/68, pulse 66, height _0  (1.6 m), weight 125 lb 9.6 oz (56.972 kg), SpO2 96 %. Gen: No apparent distress Neuro: No gross focal deficits. Neck: No JVD, lymphadenopathy, thyromegaly. RS: Clear, No wheeze or crackles CVS: S1-S2 heard, no murmurs rubs gallops. Abdomen: Soft, positive bowel sounds. Extremities: No edema.    Assessment & Plan:  #1 ILD  Review of imaging shows lung fibrosis in NSIP pattern dating back to 2014. I will further evaluate by getting pulmonary function tests and repeating a CT high resolution. I'll send blood work for  autoimmune, ILD workup.  #2 Cough  She's had chronic cough which is suspect is related to upper airway syndrome from GERD, hiatal hernia, postnasal drip. I'm not entirely convinced that this is related to the ILD. She'll continue her Flonase. I will start her on loratadine for allergies. She'll increase her Protonix to 40 mg twice a day and also get Tussionex for cough suppression. I'll give her albuterol rescue inhaler to be used as needed.  Plan: - Continue Flonase, add loratadine. - Increasing Protonix to 40 twice a day - Albuterol rescue inhaler - High res CT scan, blood work for autoimmune workup.  Marshell Garfinkel MD [facility] Pager [phone] If no answer or after 3pm call: [phone] 11/26/2015, 2:28 PM

## 2015-12-01 ENCOUNTER — Telehealth: Payer: Self-pay | Admitting: *Deleted

## 2015-12-01 ENCOUNTER — Telehealth: Payer: Self-pay | Admitting: Nurse Practitioner

## 2015-12-01 ENCOUNTER — Other Ambulatory Visit: Payer: Self-pay | Admitting: *Deleted

## 2015-12-01 NOTE — Telephone Encounter (Signed)
Spoke to pt about the cancelling of her 3/30 appts and the change we are making b/c of holding her Herceptin per Dr. Claris Gladden recommendation. I told pt that her numbers are lower than what the doctor would like and we are holding the Herceptin for now but have scheduled a May appt and based on the numbers she may or may not ger tx. Dr. Jana Hakim will make a decision. Pt verbalized understanding and I will call her daughter as well to let her know. Message to be fwd to [employer]

## 2015-12-01 NOTE — Telephone Encounter (Signed)
Spoke with patient to confirm r/s 3/30 appt for 5/4

## 2015-12-03 ENCOUNTER — Ambulatory Visit (INDEPENDENT_AMBULATORY_CARE_PROVIDER_SITE_OTHER)
Admission: RE | Admit: 2015-12-03 | Discharge: 2015-12-03 | Disposition: A | Discharge status: Home / Self Care | Payer: Medicare Other | Source: Ambulatory Visit | Attending: Pulmonary Disease | Admitting: Pulmonary Disease

## 2015-12-03 DIAGNOSIS — J849 Interstitial pulmonary disease, unspecified: Secondary | ICD-10-CM | POA: Diagnosis not present

## 2015-12-04 ENCOUNTER — Telehealth: Payer: Self-pay | Admitting: *Deleted

## 2015-12-04 DIAGNOSIS — T451X5A Adverse effect of antineoplastic and immunosuppressive drugs, initial encounter: Principal | ICD-10-CM

## 2015-12-04 DIAGNOSIS — I427 Cardiomyopathy due to drug and external agent: Secondary | ICD-10-CM

## 2015-12-04 MED ORDER — CARVEDILOL 12.5 MG PO TABS: 18.7500 mg | ORAL_TABLET | Freq: Two times a day (BID) | ORAL | Status: DC

## 2015-12-04 NOTE — Telephone Encounter (Signed)
Pt aware and voiced understanding pts daughter aware- via detailed message

## 2015-12-04 NOTE — Telephone Encounter (Signed)
Received a call from patients daughter and she is concerned about mother. Patient has been feeling dizzy at times, worse going from sitting to standing position.  She does not check her blood pressure at home on a regular basis but systolic has been running 135-145. At last OV with Dr Aundra Dubin patients Carvedilol was increase to 25 mg twice a day and daughter concerned this is causing dizziness. Saw PCP yesterday and was advised to call Dr Aundra Dubin.  Advised daughter to start checking patients blood pressure daily and when she gets these dizzy  Advised to call back with update and will forward to Dr Aundra Dubin for any further recommendations

## 2015-12-04 NOTE — Telephone Encounter (Signed)
Decrease Coreg to 18.75 mg bid.

## 2015-12-10 NOTE — Progress Notes (Signed)
Quick Note:  Labs were ordered at last ov, why did pt not get these done? Will need to have PFT as well. ______

## 2015-12-11 ENCOUNTER — Ambulatory Visit: Payer: Medicare Other

## 2015-12-11 ENCOUNTER — Other Ambulatory Visit: Payer: Medicare Other

## 2015-12-11 ENCOUNTER — Ambulatory Visit: Payer: Medicare Other | Admitting: Nurse Practitioner

## 2016-01-08 ENCOUNTER — Other Ambulatory Visit: Payer: Self-pay | Admitting: *Deleted

## 2016-01-13 ENCOUNTER — Other Ambulatory Visit: Payer: Self-pay | Admitting: *Deleted

## 2016-01-13 DIAGNOSIS — C9210 Chronic myeloid leukemia, BCR/ABL-positive, not having achieved remission: Secondary | ICD-10-CM

## 2016-01-13 MED ORDER — IMATINIB MESYLATE 400 MG PO TABS: 400.0000 mg | ORAL_TABLET | ORAL | Status: DC

## 2016-01-14 ENCOUNTER — Inpatient Hospital Stay (HOSPITAL_COMMUNITY): Admission: RE | Admit: 2016-01-14 | Payer: Medicare Other | Source: Ambulatory Visit

## 2016-01-14 ENCOUNTER — Encounter (HOSPITAL_COMMUNITY): Payer: Medicare Other

## 2016-01-14 ENCOUNTER — Other Ambulatory Visit (HOSPITAL_COMMUNITY): Payer: Self-pay

## 2016-01-14 ENCOUNTER — Ambulatory Visit (HOSPITAL_COMMUNITY): Admission: RE | Admit: 2016-01-14 | Payer: Medicare Other | Source: Ambulatory Visit

## 2016-01-14 DIAGNOSIS — I427 Cardiomyopathy due to drug and external agent: Secondary | ICD-10-CM

## 2016-01-14 DIAGNOSIS — T451X5A Adverse effect of antineoplastic and immunosuppressive drugs, initial encounter: Principal | ICD-10-CM

## 2016-01-14 MED ORDER — CARVEDILOL 12.5 MG PO TABS: 18.7500 mg | ORAL_TABLET | Freq: Two times a day (BID) | ORAL | Status: DC

## 2016-01-15 ENCOUNTER — Ambulatory Visit: Payer: Medicare Other | Admitting: Nurse Practitioner

## 2016-01-15 ENCOUNTER — Other Ambulatory Visit: Payer: Medicare Other

## 2016-01-15 ENCOUNTER — Ambulatory Visit: Payer: Medicare Other

## 2016-01-16 ENCOUNTER — Ambulatory Visit: Payer: Medicare Other | Admitting: Nurse Practitioner

## 2016-01-16 ENCOUNTER — Ambulatory Visit: Payer: Medicare Other

## 2016-01-16 ENCOUNTER — Other Ambulatory Visit: Payer: Medicare Other

## 2016-01-28 ENCOUNTER — Other Ambulatory Visit: Payer: Self-pay | Admitting: Pulmonary Disease

## 2016-01-28 DIAGNOSIS — R0600 Dyspnea, unspecified: Secondary | ICD-10-CM

## 2016-01-29 ENCOUNTER — Ambulatory Visit (INDEPENDENT_AMBULATORY_CARE_PROVIDER_SITE_OTHER): Payer: Medicare Other | Admitting: Pulmonary Disease

## 2016-01-29 DIAGNOSIS — R0600 Dyspnea, unspecified: Secondary | ICD-10-CM | POA: Diagnosis not present

## 2016-01-29 LAB — PULMONARY FUNCTION TEST
DL/VA % PRED: 77 %
DL/VA: 3.54 ml/min/mmHg/L
DLCO COR % PRED: 23 %
DLCO COR: 5.07 ml/min/mmHg
DLCO UNC % PRED: 23 %
DLCO unc: 5.07 ml/min/mmHg
FEF 25-75 POST: 0.64 L/s
FEF 25-75 PRE: 1.35 L/s
FEF2575-%CHANGE-POST: -52 %
FEF2575-%PRED-PRE: 132 %
FEF2575-%Pred-Post: 62 %
FEV1-%Change-Post: -5 %
FEV1-%Pred-Post: 75 %
FEV1-%Pred-Pre: 80 %
FEV1-POST: 0.96 L
FEV1-Pre: 1.02 L
FEV1FVC-%Change-Post: 2 %
FEV1FVC-%PRED-PRE: 107 %
FEV6-%CHANGE-POST: -3 %
FEV6-%PRED-POST: 74 %
FEV6-%Pred-Pre: 77 %
FEV6-PRE: 1.21 L
FEV6-Post: 1.16 L
FEV6FVC-%Change-Post: 1 %
FEV6FVC-%PRED-PRE: 103 %
FEV6FVC-%Pred-Post: 105 %
FVC-%Change-Post: -7 %
FVC-%PRED-POST: 70 %
FVC-%Pred-Pre: 76 %
FVC-POST: 1.16 L
FVC-Pre: 1.26 L
PRE FEV1/FVC RATIO: 81 %
PRE FEV6/FVC RATIO: 99 %
Post FEV1/FVC ratio: 83 %
Post FEV6/FVC ratio: 100 %
RV % pred: 48 %
RV: 1.16 L
TLC % pred: 49 %
TLC: 2.36 L

## 2016-01-29 NOTE — Progress Notes (Signed)
PFT done today. 

## 2016-02-05 ENCOUNTER — Inpatient Hospital Stay (HOSPITAL_COMMUNITY): Admission: RE | Admit: 2016-02-05 | Payer: Medicare Other | Source: Ambulatory Visit

## 2016-02-05 ENCOUNTER — Encounter (HOSPITAL_COMMUNITY): Payer: Medicare Other

## 2016-02-05 ENCOUNTER — Ambulatory Visit (HOSPITAL_COMMUNITY): Admission: RE | Admit: 2016-02-05 | Payer: Medicare Other | Source: Ambulatory Visit

## 2016-02-05 ENCOUNTER — Other Ambulatory Visit (HOSPITAL_COMMUNITY): Payer: Self-pay | Admitting: *Deleted

## 2016-02-05 DIAGNOSIS — C50412 Malignant neoplasm of upper-outer quadrant of left female breast: Secondary | ICD-10-CM

## 2016-02-23 ENCOUNTER — Telehealth: Payer: Self-pay | Admitting: *Deleted

## 2016-02-23 ENCOUNTER — Other Ambulatory Visit: Payer: Self-pay | Admitting: *Deleted

## 2016-02-23 NOTE — Telephone Encounter (Signed)
VM received from pt's daughter- Vira Agar requesting a return call regarding " paper work for when I need to leave work "  Return call number given as cell of [phone].  This RN returned call and obtained identified VM- message left informing Vira Agar this office can assist with above. Also requested a return call to this RN due to need to schedule pt for follow up.  POF sent requesting an appointment and lab with MD for next available.

## 2016-02-25 ENCOUNTER — Encounter: Payer: Self-pay | Admitting: Oncology

## 2016-02-25 NOTE — Progress Notes (Signed)
form left in pod- left for dr. Jana Hakim to sign

## 2016-02-25 NOTE — Progress Notes (Signed)
form left in pod

## 2016-02-26 ENCOUNTER — Other Ambulatory Visit (INDEPENDENT_AMBULATORY_CARE_PROVIDER_SITE_OTHER): Payer: Medicare Other

## 2016-02-26 ENCOUNTER — Ambulatory Visit (INDEPENDENT_AMBULATORY_CARE_PROVIDER_SITE_OTHER): Payer: Medicare Other | Admitting: Pulmonary Disease

## 2016-02-26 ENCOUNTER — Encounter: Payer: Self-pay | Admitting: Pulmonary Disease

## 2016-02-26 VITALS — BP 118/64 | HR 77 | Ht 63.0 in | Wt 119.6 lb

## 2016-02-26 DIAGNOSIS — R0600 Dyspnea, unspecified: Secondary | ICD-10-CM | POA: Diagnosis not present

## 2016-02-26 DIAGNOSIS — J849 Interstitial pulmonary disease, unspecified: Secondary | ICD-10-CM

## 2016-02-26 LAB — SEDIMENTATION RATE: Sed Rate: 25 mm/hr (ref 0–30)

## 2016-02-26 NOTE — Patient Instructions (Signed)
We will send you for blood test to determine the cause of your lung fibrosis. We will change the loratidine to chlorpheniramine 8 mg tid.  and change flonase to dymista (sample given)  Return in 1 month to discuss results and further therapy.

## 2016-02-26 NOTE — Progress Notes (Signed)
Subjective:    Patient ID: Pamela House, female    DOB: [birth year], 82 y.o.   MRN: [id number]  PROBLEM LIST Lung fibrosis in UIP pattern Chronic cough  HPI Mrs. Palau is a 82 year old with past medical history of CML, on Gleevec for many years. She has a diagnosis of breast cancer in 2016, T1b N0, stage IA invasive ductal carcinoma, grade 3, estrogen and progesterone receptor negative, HER-2 +. She underwent left lumpectomy with lymph node biopsy on 6/16 and treated with herceptin. She was recently evaluated by cardiology for reduced EF. Herceptin was held and Coreg increased.  She was evaluated in 2014 by Dr. Lamonte Sakai for chronic cough. This is felt to be due to upper airway syndrome from post nasal drip, GERD. She had a CT at that time which showed lung fibrosis. Repeat CT this year showed probable progression of disease. Her cough continues to bother her.   DATA: Imaging CT chest 05/21/13 Images reviewed. Pulmonary fibrosis.  CT high res 12/03/15 Progress of lung fibrosis UIP pattern. Images reviewed  PFTs  01/29/16 FVC 1.26 (76%) FEV1 1.02 (80%] F/F 81 TLC 49% DLCO 23%. Severe restriction and severe diffusion defect.  Social History: She is a never smoker,. No drug, alcohol use. No known inhalational exposures.  Family History: Ovarian cancer.  Past Medical History  Diagnosis Date  . Dyslipidemia   . GERD (gastroesophageal reflux disease)   . Dementia   . Anxiety   . Allergic rhinitis   . Hypertension   . DDD (degenerative disc disease)   . Osteoporosis   . Vitamin D deficiency   . Leukemia ([place]) 2010  . Radiation 04/30/15-05/1515    Left breast    Current outpatient prescriptions:  .  albuterol (PROAIR HFA) 108 (90 Base) MCG/ACT inhaler, Inhale 2 puffs into the lungs every 6 (six) hours as needed for wheezing or shortness of breath., Disp: 8 g, Rfl: 3 .  ALPRAZolam (XANAX) 0.5 MG tablet, Take 0.5 mg by mouth 2 (two) times daily as needed for anxiety. , Disp:  , Rfl:  .  aspirin 81 MG tablet, Take 81 mg by mouth daily., Disp: , Rfl:  .  carvedilol (COREG) 12.5 MG tablet, Take 1.5 tablets (18.75 mg total) by mouth 2 (two) times daily., Disp: 90 tablet, Rfl: 6 .  donepezil (ARICEPT) 5 MG tablet, Take 5 mg by mouth at bedtime., Disp: , Rfl:  .  fluticasone (FLONASE) 50 MCG/ACT nasal spray, Place 1 spray into both nostrils daily as needed for allergies or rhinitis. Reported on 09/09/2015, Disp: , Rfl:  .  furosemide (LASIX) 20 MG tablet, Take 20 mg by mouth as needed., Disp: , Rfl:  .  imatinib (GLEEVEC) 400 MG tablet, Take 1 tablet (400 mg total) by mouth See admin instructions. Take with meals and large glass of water.Caution:Chemotherapy. Takes on Tues, thurs, sat, sun, Disp: 30 tablet, Rfl: 1 .  omeprazole (PRILOSEC) 40 MG capsule, Take 1 capsule (40 mg total) by mouth 2 (two) times daily before a meal., Disp: 60 capsule, Rfl: 3 .  potassium chloride (K-DUR,KLOR-CON) 10 MEQ tablet, Take 10 mEq by mouth as needed (with lasix.)., Disp: , Rfl:  .  Probiotic Product (ALIGN PO), Take by mouth., Disp: , Rfl:  .  promethazine (PHENERGAN) 25 MG tablet, Take 25 mg by mouth 4 (four) times daily., Disp: , Rfl:  .  valsartan (DIOVAN) 160 MG tablet, Take 160 mg by mouth daily. , Disp: , Rfl:  .  Vitamin D,  Ergocalciferol, (DRISDOL) 50000 units CAPS capsule, Take 50,000 Units by mouth every 30 (thirty) days., Disp: , Rfl:   Review of Systems  Nonproductive cough, dyspnea, wheezing. No fevers, chills, loss of weight,appetite. No chest pain, palpitations. No nausea, vomiting or diarrhea, consultation. All other review systems are negative    Objective:   Physical Exam Blood pressure 118/64, pulse 77, height _0  (1.6 m), weight 119 lb 9.6 oz (54.25 kg), SpO2 97 %. Gen: No apparent distress Neuro: No gross focal deficits. Neck: No JVD, lymphadenopathy, thyromegaly. RS: Clear, No wheeze or crackles CVS: S1-S2 heard, no murmurs rubs gallops. Abdomen: Soft,  positive bowel sounds. Extremities: No edema.    Assessment & Plan:  #1 ILD  Review of imaging shows lung fibrosis dating back to 2014. There is progression of disease on repeat CT scan. I'll send blood work for autoimmune, ILD workup. If all are negative then she may be a candidate for pirfenidone or Nintedanib  #2 Cough  She's had chronic cough which is suspect is related to upper airway syndrome from GERD, hiatal hernia, postnasal drip. I'm not entirely convinced that this is related to the ILD. I will switch loratidine to chlorpheniramine and the flonase to dymista.  She'll continue her Protonix to 40 mg twice a day and albuterol rescue inhaler to be used as needed.  Plan: - Chlorpheniramine, dymista for cough - Continue Protonix to 40 twice a day - Albuterol rescue inhaler - Blood work for autoimmune workup. - 6 minute walk test  Marshell Garfinkel MD [facility] Pager [phone] If no answer or after 3pm call: [phone] 02/26/2016, 4:48 PM

## 2016-02-27 LAB — CENTROMERE ANTIBODIES: CENTROMERE AB SCREEN: NEGATIVE

## 2016-02-27 LAB — CYCLIC CITRUL PEPTIDE ANTIBODY, IGG: Cyclic Citrullin Peptide Ab: <16 Units

## 2016-02-27 LAB — JO-1 ANTIBODY-IGG: Jo-1 Antibody, IgG: <1

## 2016-02-27 LAB — SJOGREN'S SYNDROME ANTIBODS(SSA + SSB)
SSA (Ro) (ENA) Antibody, IgG: <1
SSB (LA) (ENA) ANTIBODY, IGG: NEGATIVE

## 2016-02-27 LAB — RFLX P-ANCA TITER: P-ANCA: 1:40 {titer} — ABNORMAL HIGH

## 2016-02-27 LAB — ANTI-SMITH ANTIBODY: ENA SM Ab Ser-aCnc: <1

## 2016-02-27 LAB — ANCA SCREEN W REFLEX TITER: ANCA Screen: POSITIVE — AB

## 2016-02-27 LAB — ANTI-DNA ANTIBODY, DOUBLE-STRANDED: ds DNA Ab: <1 IU/mL

## 2016-02-27 LAB — RHEUMATOID FACTOR: Rhuematoid fact SerPl-aCnc: <10 IU/mL (ref <=14)

## 2016-02-27 LAB — RNP ANTIBODY: RIBONUCLEIC PROTEIN(ENA) ANTIBODY, IGG: NEGATIVE

## 2016-02-27 LAB — C-REACTIVE PROTEIN: CRP: 0.2 mg/dL — ABNORMAL LOW (ref 0.5–20.0)

## 2016-02-27 LAB — ANTI-SCLERODERMA ANTIBODY: Scleroderma (Scl-70) (ENA) Antibody, IgG: <1

## 2016-03-01 LAB — ALDOLASE: ALDOLASE: 3.6 U/L (ref <=8.1)

## 2016-03-04 ENCOUNTER — Encounter: Payer: Self-pay | Admitting: Oncology

## 2016-03-04 LAB — MYOSITIS PANEL III
EJ: NEGATIVE
Jo-1 (WB)*: NEGATIVE
Ku*: NEGATIVE
MI-2 ANTIBODIES: NEGATIVE
OJ*: NEGATIVE
PL-12: NEGATIVE
PL-7*: NEGATIVE
PM-SCL 100: NEGATIVE
PM-SCL 75: NEGATIVE
RNP: 16.3 U/mL
RO-52: NEGATIVE
Signal Recognition Particle*: NEGATIVE

## 2016-03-04 LAB — ANA COMPREHENSIVE PANEL
Centromere Ab Screen: <0.2 AI (ref 0.0–0.9)
ENA RNP Ab: 0.3 AI (ref 0.0–0.9)
ENA SM Ab Ser-aCnc: <0.2 AI (ref 0.0–0.9)
ENA SSA (RO) Ab: <0.2 AI (ref 0.0–0.9)
ENA SSB (LA) Ab: <0.2 AI (ref 0.0–0.9)
dsDNA Ab: <1 IU/mL (ref 0–9)

## 2016-03-04 NOTE — Progress Notes (Signed)
form left in pod- left for dr. Jana Hakim to sign- let [facility] forms are ready for pick up and faxed-sent to medical recrds

## 2016-03-10 ENCOUNTER — Other Ambulatory Visit: Payer: Self-pay | Admitting: General Surgery

## 2016-03-10 ENCOUNTER — Ambulatory Visit: Payer: Medicare Other | Admitting: Oncology

## 2016-03-10 ENCOUNTER — Other Ambulatory Visit: Payer: Self-pay | Admitting: Internal Medicine

## 2016-03-10 ENCOUNTER — Other Ambulatory Visit: Payer: Medicare Other

## 2016-03-10 DIAGNOSIS — Z853 Personal history of malignant neoplasm of breast: Secondary | ICD-10-CM

## 2016-03-19 ENCOUNTER — Ambulatory Visit
Admission: RE | Admit: 2016-03-19 | Discharge: 2016-03-19 | Disposition: A | Discharge status: Home / Self Care | Payer: Medicare Other | Source: Ambulatory Visit | Attending: Internal Medicine | Admitting: Internal Medicine

## 2016-03-19 DIAGNOSIS — Z853 Personal history of malignant neoplasm of breast: Secondary | ICD-10-CM

## 2016-03-23 ENCOUNTER — Ambulatory Visit (HOSPITAL_COMMUNITY)
Admission: RE | Admit: 2016-03-23 | Discharge: 2016-03-23 | Disposition: A | Discharge status: Home / Self Care | Payer: Medicare Other | Source: Ambulatory Visit | Attending: Cardiology | Admitting: Cardiology

## 2016-03-23 ENCOUNTER — Encounter (HOSPITAL_COMMUNITY): Payer: Self-pay

## 2016-03-23 ENCOUNTER — Encounter (HOSPITAL_COMMUNITY): Payer: Self-pay | Admitting: *Deleted

## 2016-03-23 ENCOUNTER — Encounter (HOSPITAL_COMMUNITY): Payer: Self-pay | Admitting: Cardiology

## 2016-03-23 ENCOUNTER — Ambulatory Visit (HOSPITAL_BASED_OUTPATIENT_CLINIC_OR_DEPARTMENT_OTHER)
Admission: RE | Admit: 2016-03-23 | Discharge: 2016-03-23 | Disposition: A | Discharge status: Home / Self Care | Payer: Medicare Other | Source: Ambulatory Visit | Attending: Internal Medicine | Admitting: Internal Medicine

## 2016-03-23 VITALS — BP 146/70 | HR 66 | Wt 124.8 lb

## 2016-03-23 DIAGNOSIS — J8410 Pulmonary fibrosis, unspecified: Secondary | ICD-10-CM | POA: Diagnosis not present

## 2016-03-23 DIAGNOSIS — K219 Gastro-esophageal reflux disease without esophagitis: Secondary | ICD-10-CM | POA: Insufficient documentation

## 2016-03-23 DIAGNOSIS — C50412 Malignant neoplasm of upper-outer quadrant of left female breast: Secondary | ICD-10-CM

## 2016-03-23 DIAGNOSIS — C9210 Chronic myeloid leukemia, BCR/ABL-positive, not having achieved remission: Secondary | ICD-10-CM | POA: Diagnosis not present

## 2016-03-23 DIAGNOSIS — Z8041 Family history of malignant neoplasm of ovary: Secondary | ICD-10-CM | POA: Diagnosis not present

## 2016-03-23 DIAGNOSIS — Z79899 Other long term (current) drug therapy: Secondary | ICD-10-CM | POA: Insufficient documentation

## 2016-03-23 DIAGNOSIS — T451X5A Adverse effect of antineoplastic and immunosuppressive drugs, initial encounter: Secondary | ICD-10-CM | POA: Diagnosis not present

## 2016-03-23 DIAGNOSIS — C50919 Malignant neoplasm of unspecified site of unspecified female breast: Secondary | ICD-10-CM | POA: Diagnosis not present

## 2016-03-23 DIAGNOSIS — E785 Hyperlipidemia, unspecified: Secondary | ICD-10-CM | POA: Diagnosis not present

## 2016-03-23 DIAGNOSIS — F0390 Unspecified dementia without behavioral disturbance: Secondary | ICD-10-CM | POA: Diagnosis not present

## 2016-03-23 DIAGNOSIS — I429 Cardiomyopathy, unspecified: Secondary | ICD-10-CM | POA: Insufficient documentation

## 2016-03-23 DIAGNOSIS — Z171 Estrogen receptor negative status [ER-]: Secondary | ICD-10-CM | POA: Diagnosis not present

## 2016-03-23 DIAGNOSIS — I427 Cardiomyopathy due to drug and external agent: Secondary | ICD-10-CM

## 2016-03-23 DIAGNOSIS — J849 Interstitial pulmonary disease, unspecified: Secondary | ICD-10-CM

## 2016-03-23 DIAGNOSIS — I10 Essential (primary) hypertension: Secondary | ICD-10-CM | POA: Diagnosis not present

## 2016-03-23 DIAGNOSIS — Z7982 Long term (current) use of aspirin: Secondary | ICD-10-CM | POA: Insufficient documentation

## 2016-03-23 LAB — ECHOCARDIOGRAM COMPLETE
CHL CUP MV DEC (S): 349
E/e' ratio: 15.81
EWDT: 349 ms
FS: 37 % (ref 28–44)
IV/PV OW: 0.97
LA ID, A-P, ES: 35 mm
LA diam end sys: 35 mm
LA vol index: 41.5 mL/m2
LADIAMINDEX: 2.25 cm/m2
LAVOL: 64.5 mL
LAVOLA4C: 81.2 mL
LV TDI E'MEDIAL: 4.35
LV dias vol index: 50 mL/m2
LV dias vol: 78 mL (ref 46–106)
LV e' LATERAL: 5.77 cm/s
LVEEAVG: 15.81
LVEEMED: 15.81
LVOT VTI: 25.2 cm
LVOT area: 2.27 cm2
LVOT diameter: 17 mm
LVOT peak vel: 106 cm/s
LVOTSV: 57 mL
LVSYSVOL: 22 mL (ref 14–42)
LVSYSVOLIN: 14 mL/m2
MV Peak grad: 3 mmHg
MV pk A vel: 101 m/s
MVPKEVEL: 91.2 m/s
PW: 9.84 mm — AB (ref 0.6–1.1)
Simpson's disk: 72
Stroke v: 57 ml
TDI e' lateral: 5.77

## 2016-03-23 LAB — BASIC METABOLIC PANEL
ANION GAP: 5 (ref 5–15)
BUN: 16 mg/dL (ref 6–20)
CO2: 26 mmol/L (ref 22–32)
Calcium: 9.6 mg/dL (ref 8.9–10.3)
Chloride: 103 mmol/L (ref 101–111)
Creatinine, Ser: 0.83 mg/dL (ref 0.44–1.00)
GFR calc Af Amer: >60 mL/min (ref >60)
GLUCOSE: 97 mg/dL (ref 65–99)
POTASSIUM: 4.6 mmol/L (ref 3.5–5.1)
Sodium: 134 mmol/L — ABNORMAL LOW (ref 135–145)

## 2016-03-23 NOTE — Progress Notes (Signed)
Patient ID: Pamela House, female   DOB: [birth year], 82 y.o.   MRN: [id number] PCP: Dr. Lysle Rubens Oncologist: Dr. Jana Hakim (breast CA), Dr Earlie Server [facility]) HF MD: Dr Aundra Dubin.   82 yo with history of CML, breast cancer, HTN, and mild-moderate dementia presents for cardio-oncology evaluation given fall in EF while getting Herceptin therapy.  Patient has CML and has been on imatinib for an extended period.  In 5/16 she was found to have breast cancer, ER-/PR-/HER2+.  She had a lumpectomy in 6/16 and declined chemotherapy.  She has been getting Herceptin every 3 wks to be continued to 7/17.  Initial echo in 6/16 showed normal LV function.  Echo in 10/16 showed a mild fall in EF.  Echo in 1/17 has showed a more significant fall in EF to 45-50% along with a corresponding drop in global longitudinal strain.   Patient does not seem to have a significant cardiac history.  She says she had a remote catheterization but does not remember any of the details of this.  She was seen 09/15/15 in the ER and thought to have LLL PNA.  Main symptom was cough.    At last appointment, Coreg was increased to 25 mg twice a day but later decreased to 18.75 mg twice a day. Herceptin remains on hold. Evaluated by Pulmonary in June with progression of lung fibrosis. She has been referred to Dr Chase Caller. Ongoing cough. Mild dyspnea with exertion. Occasionally SOB when she is talking. Complaining of fatigue. She uses a pill box and she is able to set up.   Repeat echo today showed EF improved to 60-65%.    Labs (12/16): K 4.1, creatinine 0.9 Labs (2/17): K 4, creatinine 1.1 Labs (6/17): ANA negative, anti-SCL 70 negative, RF negative, p-ANCA weakly positive at 1:40.   PMH: 1. Hyperlipidemia: Myalgias with Lipitor.  2. Mild-moderate dementia 3. HTN 4. Degenerative disc disease 5. GERD 6. CML: She has been on imatinib.  Followed by Dr. Earlie Server.  7. Breast cancer: Diagnosed in 5/16.  ER-/PR-/HER2+.  Lumpectomy 6/16, declined  chemotherapy. She is getting Herceptin every 3 wks to be continued to 7/17.  - Echo (6/16): EF 55-60%, GLS -19%. - Echo (10/16): EF 50-55%, GLS -21.9%. - Echo (1/17): EF 45-50%, GLS -15.7%.  - Echo  (2/17): EF 45%, GLS -12.1%.  - Echo (7/17): EF 60-65%, lateral s' 10.1 cm/sec, GLS -23.7%, RV mildly dilated with mildly decreased systolic function.  8. Interstitial fibrosis: Noted on prior CT and CXR.  - High resolution chest CT (3/17) with UIP fibrosis pattern. - PFTs (5/17) with severe restriction and severely decreased DLCO.   SH: Lives alone, 1 living daughter, nonsmoker.   FH: Ovarian cancer  ROS: All systems reviewed and negative except as per HPI.  Current Outpatient Prescriptions  Medication Sig Dispense Refill  . albuterol (PROAIR HFA) 108 (90 Base) MCG/ACT inhaler Inhale 2 puffs into the lungs every 6 (six) hours as needed for wheezing or shortness of breath. 8 g 3  . ALPRAZolam (XANAX) 0.5 MG tablet Take 0.5 mg by mouth 2 (two) times daily as needed for anxiety.     Marland Kitchen aspirin 81 MG tablet Take 81 mg by mouth daily.    . carvedilol (COREG) 12.5 MG tablet Take 1.5 tablets (18.75 mg total) by mouth 2 (two) times daily. 90 tablet 6  . donepezil (ARICEPT) 5 MG tablet Take 5 mg by mouth at bedtime.    . fluticasone (FLONASE) 50 MCG/ACT nasal spray Place 1 spray into  both nostrils daily as needed for allergies or rhinitis. Reported on 09/09/2015    . furosemide (LASIX) 20 MG tablet Take 20 mg by mouth as needed.    . imatinib (GLEEVEC) 400 MG tablet Take 1 tablet (400 mg total) by mouth See admin instructions. Take with meals and large glass of water.Caution:Chemotherapy. Takes on Tues, thurs, sat, sun 30 tablet 1  . omeprazole (PRILOSEC) 40 MG capsule Take 1 capsule (40 mg total) by mouth 2 (two) times daily before a meal. 60 capsule 3  . potassium chloride (K-DUR,KLOR-CON) 10 MEQ tablet Take 10 mEq by mouth as needed (with lasix.).    Marland Kitchen Probiotic Product (ALIGN PO) Take by mouth.     . promethazine (PHENERGAN) 25 MG tablet Take 25 mg by mouth 4 (four) times daily.    . valsartan (DIOVAN) 160 MG tablet Take 160 mg by mouth daily.     . Vitamin D, Ergocalciferol, (DRISDOL) 50000 units CAPS capsule Take 50,000 Units by mouth every 30 (thirty) days.     No current facility-administered medications for this encounter.   BP 146/70 mmHg  Pulse 66  Wt 124 lb 12 oz (56.586 kg)  SpO2 97% General: NAD Neck: No JVD, no thyromegaly or thyroid nodule.  Lungs: Dry crackles at bases bilaterally. CV: Nondisplaced PMI.  Heart regular S1/S2, no S3/S4, 1/6 SEM RUSB.  No peripheral edema.  No carotid bruit.  Normal pedal pulses.  Abdomen: Soft, nontender, no hepatosplenomegaly, no distention.  Skin: Intact without lesions or rashes.  Neurologic: Alert and oriented x 3.  Psych: Normal affect. Extremities: No clubbing or cyanosis.  HEENT: Normal.   Assessment/Plan: 1. Cardiomyopathy: EF and global longitudinal strain have fell with initiation of Herceptin. EF down to 45-50% on 1/17 echo and Herceptin was held.  In February EF down to 45%. Today ECHO was reviewed and discussed. EF improved to 60-65%.  Volumes status stable. Continue lasix as needed.  - Continue valsartan.  - Continue coreg to 18.75 . Does not tolerate 25 mg twice a day due to dizziness.  - Will review with Dr Jana Hakim => could re-challenge with Herceptin while on Coreg and valsartan, but would need to follow closely for fall in EF.  If there are other good treatment options besides Herceptin, would pursue them in lieu of Herceptin.  If Herceptin is restarted, will need repeat echo/office visit in 2 months. 2. HTN: BP a little high. Did not tolerate uptitration of coreg.  3. Cough: Saw pulmonary. CT with progression of pulmonary fibrosis, looks like UIP. Referred to Dr Chase Caller for possible treatment.   Follow up  2 months with an ECHO.   Amy Clegg NP-C  03/23/2016   Patient seen with NP, agree with the above note.   EF has improved back to normal range off Herceptin and on Coreg + valsartan.  Could consider re-challenge with Herceptin with continuation of Coreg/valsartan and close echo followup.  Will discuss with Dr Jana Hakim.  If Herceptin resumed, would repeat echo/office visit in 2 months.  If there are other effective treatment options, would pursue those instead.   She has pulmonary fibrosis, likely UIP, as cause of her cough/dyspnea.  She will be seeing Dr Chase Caller for possible pirfenidone treatment.   [name] 03/23/2016

## 2016-03-23 NOTE — Progress Notes (Signed)
  Echocardiogram 2D Echocardiogram has been performed.  [name] 03/23/2016, 11:59 AM

## 2016-03-23 NOTE — Patient Instructions (Signed)
Lab today  Your physician recommends that you schedule a follow-up appointment in: 2 months with echocardiogram

## 2016-03-25 ENCOUNTER — Other Ambulatory Visit: Payer: Self-pay | Admitting: Oncology

## 2016-03-29 ENCOUNTER — Ambulatory Visit (INDEPENDENT_AMBULATORY_CARE_PROVIDER_SITE_OTHER): Payer: Medicare Other | Admitting: Pulmonary Disease

## 2016-03-29 ENCOUNTER — Encounter: Payer: Self-pay | Admitting: Pulmonary Disease

## 2016-03-29 VITALS — BP 124/76 | HR 73 | Ht 63.0 in | Wt 121.8 lb

## 2016-03-29 DIAGNOSIS — J84112 Idiopathic pulmonary fibrosis: Secondary | ICD-10-CM

## 2016-03-29 DIAGNOSIS — J849 Interstitial pulmonary disease, unspecified: Secondary | ICD-10-CM | POA: Diagnosis not present

## 2016-03-29 DIAGNOSIS — R0600 Dyspnea, unspecified: Secondary | ICD-10-CM

## 2016-03-29 MED ORDER — OMEPRAZOLE 40 MG PO CPDR: 40.0000 mg | DELAYED_RELEASE_CAPSULE | Freq: Two times a day (BID) | ORAL | Status: DC

## 2016-03-29 NOTE — Patient Instructions (Signed)
We will start you on Pirfenidone for ILD Please call if you have worsening of diarrhea, stomach pain, nausea, anorexia We will see you in clinic in 1 month to check labs.

## 2016-03-29 NOTE — Progress Notes (Signed)
Subjective:    Patient ID: Pamela House, female    DOB: 1930/07/01, 82 y.o.   MRN: [id number]  PROBLEM LIST Lung fibrosis, IPF Chronic cough  HPI Pamela House is a 82 year old with past medical history of CML, on Gleevec for many years. She has a diagnosis of breast cancer in 2016, T1b N0, stage IA invasive ductal carcinoma, grade 3, estrogen and progesterone receptor negative, HER-2 +. She underwent left lumpectomy with lymph node biopsy on 6/16 and treated with herceptin. She was recently evaluated by cardiology for reduced EF. Herceptin was held and Coreg increased.  She was evaluated in 2014 by Dr. Lamonte Sakai for chronic cough. This is felt to be due to upper airway syndrome from post nasal drip, GERD. She had a CT at that time which showed lung fibrosis. Repeat CT this year showed probable progression of disease. Her cough continues to bother her.   DATA: Imaging CT chest 05/21/13 Images reviewed. Pulmonary fibrosis.  CT high res 12/03/15 Progress of lung fibrosis UIP pattern. Images reviewed  PFTs  01/29/16 FVC 1.26 (76%) FEV1 1.02 (80%] F/F 81 TLC 49% DLCO 23%. Severe restriction and severe diffusion defect.  6 minute walk test 03/30/15- 384 meters  Serologies 02/26/16 p ANCA 1:40 ANA, Ro, La, RA, Scl 70, CCP- negative  Social History: She is a never smoker,. No drug, alcohol use. No known inhalational exposures.  Family History: Ovarian cancer.  Past Medical History  Diagnosis Date  . Dyslipidemia   . GERD (gastroesophageal reflux disease)   . Dementia   . Anxiety   . Allergic rhinitis   . Hypertension   . DDD (degenerative disc disease)   . Osteoporosis   . Vitamin D deficiency   . Leukemia ([place]) 2010  . Radiation 04/30/15-05/1515    Left breast    Current outpatient prescriptions:  .  albuterol (PROAIR HFA) 108 (90 Base) MCG/ACT inhaler, Inhale 2 puffs into the lungs every 6 (six) hours as needed for wheezing or shortness of breath., Disp: 8 g, Rfl: 3 .   ALPRAZolam (XANAX) 0.5 MG tablet, Take 0.5 mg by mouth 2 (two) times daily as needed for anxiety. , Disp: , Rfl:  .  aspirin 81 MG tablet, Take 81 mg by mouth daily., Disp: , Rfl:  .  carvedilol (COREG) 12.5 MG tablet, Take 1.5 tablets (18.75 mg total) by mouth 2 (two) times daily., Disp: 90 tablet, Rfl: 6 .  donepezil (ARICEPT) 5 MG tablet, Take 5 mg by mouth at bedtime., Disp: , Rfl:  .  fluticasone (FLONASE) 50 MCG/ACT nasal spray, Place 1 spray into both nostrils daily as needed for allergies or rhinitis. Reported on 09/09/2015, Disp: , Rfl:  .  furosemide (LASIX) 20 MG tablet, Take 20 mg by mouth as needed., Disp: , Rfl:  .  imatinib (GLEEVEC) 400 MG tablet, Take 1 tablet (400 mg total) by mouth See admin instructions. Take with meals and large glass of water.Caution:Chemotherapy. Takes on Tues, thurs, sat, sun, Disp: 30 tablet, Rfl: 1 .  omeprazole (PRILOSEC) 40 MG capsule, Take 1 capsule (40 mg total) by mouth 2 (two) times daily before a meal., Disp: 60 capsule, Rfl: 3 .  potassium chloride (K-DUR,KLOR-CON) 10 MEQ tablet, Take 10 mEq by mouth as needed (with lasix.)., Disp: , Rfl:  .  Probiotic Product (ALIGN PO), Take by mouth., Disp: , Rfl:  .  promethazine (PHENERGAN) 25 MG tablet, Take 25 mg by mouth 4 (four) times daily., Disp: , Rfl:  .  valsartan (DIOVAN) 160 MG tablet, Take 160 mg by mouth daily. , Disp: , Rfl:  .  Vitamin D, Ergocalciferol, (DRISDOL) 50000 units CAPS capsule, Take 50,000 Units by mouth every 30 (thirty) days., Disp: , Rfl:   Review of Systems  Nonproductive cough, dyspnea, wheezing. No fevers, chills, loss of weight,appetite. No chest pain, palpitations. No nausea, vomiting or diarrhea, consultation. All other review systems are negative    Objective:   Physical Exam Blood pressure 118/64, pulse 77, height _0  (1.6 m), weight 119 lb 9.6 oz (54.25 kg), SpO2 97 %. Gen: No apparent distress Neuro: No gross focal deficits. Neck: No JVD, lymphadenopathy,  thyromegaly. RS: Clear, No wheeze or crackles CVS: S1-S2 heard, no murmurs rubs gallops. Abdomen: Soft, positive bowel sounds. Extremities: No edema.    Assessment & Plan:  #1 ILD  Review of imaging shows lung fibrosis dating back to 2014. There is progression of disease on repeat CT scan. Blood work for autoimmune, ILD workup are all negative except for weakly postive p-ANCA. The picure is consistent with IPF. We will start pirfenidone today. She already had GI side effects likely from [place] that may limit her ability to use this drug. Return to clinic in 1 month for LFTs and monitoring of symptoms  #2 Cough  She's had chronic cough which is suspect is related to upper airway syndrome from GERD, hiatal hernia, postnasal drip. She'll continue her dymisat, chlorpheniramine and Protonix to 40 mg twice a day and albuterol rescue inhaler to be used as needed.  Plan: - Chlorpheniramine, dymista for cough - Protonix to 40 twice a day - Albuterol rescue inhaler - Start pirfenidone   Marshell Garfinkel MD [facility] Pager [phone] If no answer or after 3pm call: [phone] 03/29/2016, 4:59 PM

## 2016-04-05 ENCOUNTER — Telehealth: Payer: Self-pay | Admitting: Pulmonary Disease

## 2016-04-05 NOTE — Telephone Encounter (Signed)
Pt seen 7.17.17 by PM Pt was Instructions     Return in about 4 weeks (around 04/26/2016).  We will start you on Pirfenidone for ILD Please call if you have worsening of diarrhea, stomach pain, nausea, anorexia We will see you in clinic in 1 month to check labs.   Patient was given new pt packet on the [place] and form to fill out her portion and bring back to the office.  Pt has not yet returned form.  Called spoke with patient who reported that she has read the information packet given to her and has decided that she does not want to begin [place] d/t the possible GI side-effects and cost.  Did discuss with patient that we can assist with the cost and provide recommendations to help w/ any GI issues.  Pt again stated that she would not like to take the [place].  Will forward to PM to make him aware.

## 2016-04-20 ENCOUNTER — Other Ambulatory Visit: Payer: Medicare Other

## 2016-04-20 ENCOUNTER — Encounter: Payer: Self-pay | Admitting: Oncology

## 2016-04-20 ENCOUNTER — Ambulatory Visit: Payer: Medicare Other | Admitting: Oncology

## 2016-04-26 ENCOUNTER — Other Ambulatory Visit: Payer: Self-pay | Admitting: *Deleted

## 2016-04-28 ENCOUNTER — Telehealth: Payer: Self-pay

## 2016-04-28 ENCOUNTER — Telehealth: Payer: Self-pay | Admitting: Oncology

## 2016-04-28 ENCOUNTER — Other Ambulatory Visit: Payer: Self-pay | Admitting: *Deleted

## 2016-04-28 DIAGNOSIS — C9210 Chronic myeloid leukemia, BCR/ABL-positive, not having achieved remission: Secondary | ICD-10-CM

## 2016-04-28 MED ORDER — IMATINIB MESYLATE 400 MG PO TABS: 400.0000 mg | ORAL_TABLET | ORAL | 3 refills | Status: DC

## 2016-04-28 NOTE — Telephone Encounter (Signed)
Patient called to reschedule appt  From 04/20/16.

## 2016-04-28 NOTE — Telephone Encounter (Addendum)
Pt called for gleevec refill. Please send to [facility]. She used last pill Sunday.  Pt stated she missed her last appt d/t daughter in a car accident. Wanting to r/s. In basket sent.

## 2016-04-30 ENCOUNTER — Telehealth: Payer: Self-pay

## 2016-04-30 ENCOUNTER — Other Ambulatory Visit: Payer: Self-pay

## 2016-04-30 DIAGNOSIS — C50412 Malignant neoplasm of upper-outer quadrant of left female breast: Secondary | ICD-10-CM

## 2016-04-30 DIAGNOSIS — C9210 Chronic myeloid leukemia, BCR/ABL-positive, not having achieved remission: Secondary | ICD-10-CM

## 2016-04-30 MED ORDER — IMATINIB MESYLATE 400 MG PO TABS: 400.0000 mg | ORAL_TABLET | ORAL | 3 refills | Status: DC

## 2016-04-30 NOTE — Telephone Encounter (Signed)
rx printed d/t length of verbage. Called in Rx at [phone] to [facility]

## 2016-04-30 NOTE — Telephone Encounter (Signed)
Noted order for gleevec with "fax fail". Confirmed with pharmacy that gleevic order not received. Sent Rx e-scribe.

## 2016-05-03 ENCOUNTER — Other Ambulatory Visit: Payer: Medicare Other

## 2016-05-03 ENCOUNTER — Encounter: Payer: Self-pay | Admitting: Oncology

## 2016-05-03 ENCOUNTER — Ambulatory Visit: Payer: Medicare Other | Admitting: Oncology

## 2016-05-11 ENCOUNTER — Ambulatory Visit: Payer: Medicare Other | Admitting: Pulmonary Disease

## 2016-05-13 ENCOUNTER — Ambulatory Visit: Payer: Medicare Other | Admitting: Pulmonary Disease

## 2016-06-01 ENCOUNTER — Ambulatory Visit (HOSPITAL_COMMUNITY): Admission: RE | Admit: 2016-06-01 | Payer: Medicare Other | Source: Ambulatory Visit

## 2016-06-01 ENCOUNTER — Encounter (HOSPITAL_COMMUNITY): Payer: Medicare Other

## 2016-06-10 ENCOUNTER — Telehealth: Payer: Self-pay | Admitting: Oncology

## 2016-06-10 NOTE — Telephone Encounter (Signed)
Appointment rescheduled per patient request.

## 2016-06-21 ENCOUNTER — Ambulatory Visit (HOSPITAL_BASED_OUTPATIENT_CLINIC_OR_DEPARTMENT_OTHER): Payer: Medicare Other | Admitting: Oncology

## 2016-06-21 ENCOUNTER — Other Ambulatory Visit (HOSPITAL_BASED_OUTPATIENT_CLINIC_OR_DEPARTMENT_OTHER): Payer: Medicare Other

## 2016-06-21 VITALS — BP 146/88 | HR 84 | Temp 98.3°F | Resp 18 | Ht 63.0 in | Wt 118.1 lb

## 2016-06-21 DIAGNOSIS — Z171 Estrogen receptor negative status [ER-]: Secondary | ICD-10-CM | POA: Diagnosis not present

## 2016-06-21 DIAGNOSIS — J849 Interstitial pulmonary disease, unspecified: Secondary | ICD-10-CM

## 2016-06-21 DIAGNOSIS — C50412 Malignant neoplasm of upper-outer quadrant of left female breast: Secondary | ICD-10-CM

## 2016-06-21 DIAGNOSIS — C9210 Chronic myeloid leukemia, BCR/ABL-positive, not having achieved remission: Secondary | ICD-10-CM | POA: Diagnosis not present

## 2016-06-21 LAB — CBC WITH DIFFERENTIAL/PLATELET
BASO%: 0.6 % (ref 0.0–2.0)
BASOS ABS: 0 10*3/uL (ref 0.0–0.1)
EOS%: 4.3 % (ref 0.0–7.0)
Eosinophils Absolute: 0.2 10*3/uL (ref 0.0–0.5)
HCT: 36.9 % (ref 34.8–46.6)
HEMOGLOBIN: 11.9 g/dL (ref 11.6–15.9)
LYMPH%: 40.3 % (ref 14.0–49.7)
MCH: 30 pg (ref 25.1–34.0)
MCHC: 32.2 g/dL (ref 31.5–36.0)
MCV: 93.1 fL (ref 79.5–101.0)
MONO#: 0.4 10*3/uL (ref 0.1–0.9)
MONO%: 10.2 % (ref 0.0–14.0)
NEUT#: 1.6 10*3/uL (ref 1.5–6.5)
NEUT%: 44.6 % (ref 38.4–76.8)
Platelets: 261 10*3/uL (ref 145–400)
RBC: 3.96 10*6/uL (ref 3.70–5.45)
RDW: 13.2 % (ref 11.2–14.5)
WBC: 3.6 10*3/uL — ABNORMAL LOW (ref 3.9–10.3)
lymph#: 1.4 10*3/uL (ref 0.9–3.3)

## 2016-06-21 LAB — COMPREHENSIVE METABOLIC PANEL
ALT: 11 U/L (ref 0–55)
ANION GAP: 8 meq/L (ref 3–11)
AST: 23 U/L (ref 5–34)
Albumin: 3.7 g/dL (ref 3.5–5.0)
Alkaline Phosphatase: 100 U/L (ref 40–150)
BUN: 8.5 mg/dL (ref 7.0–26.0)
CHLORIDE: 107 meq/L (ref 98–109)
CO2: 25 meq/L (ref 22–29)
CREATININE: 0.9 mg/dL (ref 0.6–1.1)
Calcium: 10.2 mg/dL (ref 8.4–10.4)
EGFR: 71 mL/min/{1.73_m2} — AB (ref >90)
GLUCOSE: 92 mg/dL (ref 70–140)
Potassium: 3.9 mEq/L (ref 3.5–5.1)
SODIUM: 140 meq/L (ref 136–145)
TOTAL PROTEIN: 7.3 g/dL (ref 6.4–8.3)
Total Bilirubin: 0.4 mg/dL (ref 0.20–1.20)

## 2016-06-21 MED ORDER — DIPHENOXYLATE-ATROPINE 2.5-0.025 MG PO TABS: 1.0000 | ORAL_TABLET | Freq: Four times a day (QID) | ORAL | 0 refills | Status: DC | PRN

## 2016-06-21 MED ORDER — IMATINIB MESYLATE 400 MG PO TABS: 400.0000 mg | ORAL_TABLET | ORAL | 3 refills | Status: DC

## 2016-06-21 NOTE — Progress Notes (Signed)
[name]  [phone] [phone]     ID: Pamela House DOB: 12-17-1929  MR#: [id number]  DXI#:[id number]  Patient Care Team: Wenda Low, MD as PCP - General (Internal Medicine) Chauncey Cruel, MD as Consulting Physician (Oncology) Autumn Messing III, MD as Consulting Physician (General Surgery) Larey Dresser, MD as Consulting Physician (Cardiology) PCP: Wenda Low, MD GYN: OTHER MD:  CHIEF COMPLAINT: HER-2 positive early stage breast cancer; chronic myeloid leukemia  CURRENT TREATMENT:  Gleevec  BREAST CANCER HISTORY: From the original intake note:  [name] has a history of chronic myeloid leukemia followed by Dr. Earlie Server. She is treated with imatinib and her most recent BCR.ABL/ABL ratio was nearly unmeasurable.   On 12/25/2014 the patient had routine bilateral screening mammography at the breast Center, showing a breast density category B. There was a possible mass in the left breast and the patient was recalled for left diagnostic mammography with tomosynthesis and left breast ultrasonography 01/02/2015. This showed an 8 mm mass with indistinct margins in the posterior third of the outer left breast, which was palpable at the 2:30 position 8 cm from the nipple. Ultrasound confirmed an oval solid mass measuring 8 mm. Ultrasound of the left axilla showed no abnormal lymphadenopathy.  Biopsy of the left breast mass in question 01/23/2015 showed ([id number]) an invasive ductal carcinoma, grade 3, estrogen and progesterone receptor negative, but with HER-2 amplification, the signals ratio being 2.38, and the number per cell 3.45. MIB-1 was 79%.  The patient's case was presented at the multidisciplinary breast cancer conference 01/29/2015. At that time it was felt that breast conserving surgery would be the first step, to be followed by adjuvant chemotherapy and anti-HER-2 immunotherapy.  The patient's subsequent history is as detailed  below  INTERVAL HISTORY:  [name] returns for follow-up of her chronic myeloid leukemia and history of breast cancer the patient's daughter has not been able to come to her recent appointments because she has a job that is very demanding in terms of time off, [name] tells me. Unfortunately this means [name] has missed several appointments. Arleigh drove herself here today.   [name] is tolerating the [place] well. The Gleevec bottle that I looked at the day however was empty. She is not sure how long she's been out of the medication but she tells me that she has some more at home. She has also run out of the Lomotil and she says sometimes she just can't get to the doctor because of diarrhea problems.  REVIEW OF SYSTEMS:  [name] has a constant cough and this is being followed by pulmonary. She denies headaches. She says her vision is not that good but she can get around. She denies nausea or vomiting. She denies intercurrent fevers or bleeding problems. She has aches and pains here and there which she tells me are "old". A detailed review of systems today was otherwise stable  PAST MEDICAL HISTORY: Past Medical History:  Diagnosis Date  . Allergic rhinitis   . Anxiety   . DDD (degenerative disc disease)   . Dementia   . Dyslipidemia   . GERD (gastroesophageal reflux disease)   . Hypertension   . Leukemia ([place]) 2010  . Osteoporosis   . Radiation 04/30/15-05/1515   Left breast  . Vitamin D deficiency     PAST SURGICAL HISTORY: Past Surgical History:  Procedure Laterality Date  . BACK SURGERY  1984  . BREAST LUMPECTOMY WITH RADIOACTIVE SEED AND SENTINEL LYMPH NODE BIOPSY Left  02/27/2015   Procedure: LEFT BREAST LUMPECTOMY WITH RADIOACTIVE SEED AND SENTINEL LYMPH NODE BIOPSY;  Surgeon: Autumn Messing III, MD;  Location: [facility];  Service: General;  Laterality: Left;    FAMILY HISTORY No family history on file. The patient's father died at the age of 88. The patient's mother died in her  64X from complications of diabetes. The patient had one full brother and one full sister there this fall sister had ovarian cancer diagnosed late in life. The patient had 14 additional half siblings. There is no other history of breast cancer in the family.  GYNECOLOGIC HISTORY:  No LMP recorded. Patient has had a hysterectomy. Menarche age 68, first live birth age 89. She is GX P5. Pamela House is not sure when she went through the change of life, but she had a hysterectomy remotely. She is not sure whether the ovaries were removed. She did not take hormone replacement.  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 Her husband died in 01-Nov-1999 and she lives by herself, with no pets, in the [employer] school apartments off some. There are exercise groups there but not a gym. Her daughter [name] is Agricultural consultant for a shipping company in [place]. Granddaughter [name] (pronounced "Trey Paula") is a Education officer, museum for the child protective services in [place]. The patient has 2 sons who have died. She attends a [employer].    ADVANCED DIRECTIVES: Her daughter [name] is her healthcare power of attorney. She can be reached at [phone].   HEALTH MAINTENANCE: Social History  Substance Use Topics  . Smoking status: Never Smoker  . Smokeless tobacco: Not on file  . Alcohol use No     Colonoscopy:  PAP:  Bone density:  Lipid panel:  Allergies  Allergen Reactions  . Ace Inhibitors Cough  . Hyzaar [Losartan Potassium-Hctz] Cough  . Lipitor [Atorvastatin]     Muscle fatigue  . Losartan Cough  . Tiazac [Diltiazem Hcl Er Beads] Cough    Current Outpatient Prescriptions  Medication Sig Dispense Refill  . albuterol (PROAIR HFA) 108 (90 Base) MCG/ACT inhaler Inhale 2 puffs into the lungs every 6 (six) hours as needed for wheezing or shortness of breath. 8 g 3  . ALPRAZolam (XANAX) 0.5 MG tablet Take 0.5 mg by mouth 2  (two) times daily as needed for anxiety.     Marland Kitchen aspirin 81 MG tablet Take 81 mg by mouth daily.    . carvedilol (COREG) 12.5 MG tablet Take 1.5 tablets (18.75 mg total) by mouth 2 (two) times daily. 90 tablet 6  . donepezil (ARICEPT) 5 MG tablet Take 5 mg by mouth at bedtime.    . fluticasone (FLONASE) 50 MCG/ACT nasal spray Place 1 spray into both nostrils daily as needed for allergies or rhinitis. Reported on 09/09/2015    . furosemide (LASIX) 20 MG tablet Take 20 mg by mouth as needed.    . imatinib (GLEEVEC) 400 MG tablet Take 1 tablet (400 mg total) by mouth See admin instructions. Take with meals and large glass of water.Caution:Chemotherapy. Takes on Tues, thurs, sat, sun 30 tablet 3  . omeprazole (PRILOSEC) 40 MG capsule Take 1 capsule (40 mg total) by mouth 2 (two) times daily before a meal. 60 capsule 5  . potassium chloride (K-DUR,KLOR-CON) 10 MEQ tablet Take 10 mEq by mouth as needed (with lasix.).    Marland Kitchen Probiotic Product (ALIGN PO) Take by mouth.    Marland Kitchen  promethazine (PHENERGAN) 25 MG tablet Take 25 mg by mouth 4 (four) times daily.    . valsartan (DIOVAN) 160 MG tablet Take 160 mg by mouth daily.     . Vitamin D, Ergocalciferol, (DRISDOL) 50000 units CAPS capsule Take 50,000 Units by mouth every 30 (thirty) days.     No current facility-administered medications for this visit.     OBJECTIVE: Older Black woman Who appears stated age 49:   06/21/16 1538  BP: (!) 146/88  Pulse: 84  Resp: 18  Temp: 98.3 F (36.8 C)     Body mass index is 20.92 kg/m.    ECOG FS:1 - Symptomatic but completely ambulatory  Sclerae unicteric, pupils round and equal Oropharynx clear and moist-- no thrush or other lesions No cervical or supraclavicular adenopathy Lungs no rales or rhonchi Heart regular rate and rhythm Abd soft, nontender, positive bowel sounds MSK no focal spinal tenderness, no upper extremity lymphedema Neuro: nonfocal, well oriented, appropriate affect Breasts: The right  breast is unremarkable. The left breast status post lumpectomy and radiation. There is no evidence of local recurrence. Left axilla is benign.   LAB RESULTS:  CMP     Component Value Date/Time   NA 134 (L) 03/23/2016 1259   NA 137 10/21/2015 1032   K 4.6 03/23/2016 1259   K 4.0 10/21/2015 1032   CL 103 03/23/2016 1259   CL 106 02/21/2013 1355   CO2 26 03/23/2016 1259   CO2 22 10/21/2015 1032   GLUCOSE 97 03/23/2016 1259   GLUCOSE 111 10/21/2015 1032   GLUCOSE 92 02/21/2013 1355   BUN 16 03/23/2016 1259   BUN 8.8 10/21/2015 1032   CREATININE 0.83 03/23/2016 1259   CREATININE 1.1 10/21/2015 1032   CALCIUM 9.6 03/23/2016 1259   CALCIUM 9.2 10/21/2015 1032   PROT 7.1 10/21/2015 1032   ALBUMIN 3.7 10/21/2015 1032   AST 25 10/21/2015 1032   ALT 17 10/21/2015 1032   ALKPHOS 92 10/21/2015 1032   BILITOT 0.40 10/21/2015 1032   GFRNONAA >60 03/23/2016 1259   GFRAA >60 03/23/2016 1259    INo results found for: SPEP, UPEP  Lab Results  Component Value Date   WBC 3.6 (L) 06/21/2016   NEUTROABS 1.6 06/21/2016   HGB 11.9 06/21/2016   HCT 36.9 06/21/2016   MCV 93.1 06/21/2016   PLT 261 06/21/2016      Chemistry      Component Value Date/Time   NA 134 (L) 03/23/2016 1259   NA 137 10/21/2015 1032   K 4.6 03/23/2016 1259   K 4.0 10/21/2015 1032   CL 103 03/23/2016 1259   CL 106 02/21/2013 1355   CO2 26 03/23/2016 1259   CO2 22 10/21/2015 1032   BUN 16 03/23/2016 1259   BUN 8.8 10/21/2015 1032   CREATININE 0.83 03/23/2016 1259   CREATININE 1.1 10/21/2015 1032      Component Value Date/Time   CALCIUM 9.6 03/23/2016 1259   CALCIUM 9.2 10/21/2015 1032   ALKPHOS 92 10/21/2015 1032   AST 25 10/21/2015 1032   ALT 17 10/21/2015 1032   BILITOT 0.40 10/21/2015 1032       No results found for: LABCA2  No components found for: LABCA125  No results for input(s): INR in the last 168 hours.  Urinalysis    Component Value Date/Time   COLORURINE YELLOW 06/12/2014 2231     APPEARANCEUR CLEAR 06/12/2014 2231   LABSPEC 1.010 10/21/2015 1135   PHURINE 5.0 10/21/2015 1135   PHURINE  6.5 06/12/2014 2231   GLUCOSEU Negative 10/21/2015 1135   HGBUR Negative 10/21/2015 1135   HGBUR NEGATIVE 06/12/2014 2231   BILIRUBINUR Negative 10/21/2015 1135   KETONESUR Negative 10/21/2015 1135   KETONESUR NEGATIVE 06/12/2014 2231   PROTEINUR Negative 10/21/2015 1135   PROTEINUR NEGATIVE 06/12/2014 2231   UROBILINOGEN 0.2 10/21/2015 1135   NITRITE Negative 10/21/2015 1135   NITRITE NEGATIVE 06/12/2014 2231   LEUKOCYTESUR Negative 10/21/2015 1135    STUDIES:   CLINICAL DATA:  Patient underwent left lumpectomy for breast carcinoma change in 2016. She has also undergone chemotherapy and radiation therapy. Patient has occasional complaints of retroareolar left breast pain.  EXAM: 2D DIGITAL DIAGNOSTIC BILATERAL MAMMOGRAM WITH ADJUNCT TOMO  COMPARISON:  Previous exam(s).  ACR Breast Density Category b: There are scattered areas of fibroglandular density.  FINDINGS: On the left, there is skin thickening and thickening of the suspensory ligaments, but no discrete mass. Some architectural distortion reflecting postsurgical scarring is seen in the posterior lateral left breast. No other architectural distortion. No suspicious calcifications.  On the right there are no masses, areas of significant asymmetry, areas of architectural distortion or suspicious calcifications. No mammographic change.  IMPRESSION: No evidence of recurrent or new breast malignancy. Benign post surgical and post treatment related changes on the left.  RECOMMENDATION: Diagnostic mammography in 1 year per standard post lumpectomy protocol.  I have discussed the findings and recommendations with the patient. Results were also provided in writing at the conclusion of the visit. If applicable, a reminder letter will be sent to the patient regarding the next appointment.  BI-RADS  CATEGORY  2: Benign.   Electronically Signed   By: Lajean Manes M.D.   On: 03/19/2016 16:21    ASSESSMENT: 82 y.o. [place] woman status post left breast Upper outer quadrant biopsy 01/23/2015 for a clinical T1b N0, stage IA invasive ductal carcinoma, grade 3, estrogen and progesterone receptor negative, but HER-2 amplified, with an MIB-1 of 79%  (1) genetics testing requested--patient with breast cancer, family history of ovarian cancer  (2) left lumpectomy with sentinel lymph node biopsy on 02/27/2015  (3) adjuvant anti-HER-2 immunotherapy with trastuzumab every 3 weeks through 1 year, started 03/28/15. Patient declined chemotherapy (abraxane).  (a) echocardiogram 09/29/2015 showed an ejection fraction of 45-50%, with diffuse hypokinesis. Herceptin interrupted after 09/09/2015 dose  (b) echocardiogram of 03/23/2016 shows an ejection fraction of 60-65%.  (4) radiation 04/30/15-05/29/15: Left breast/ 42.72 Gy at 2.67 Gy per fraction x 21 fractions.  Left breast boost/ 10 Gy at 2 Gy per fraction x 5 fractions  (5) history of chronic myeloid leukemia, continues on imatinib/Gleevec 400 mg daily  (a) bcr.abl / abl ratio 0.00013 as of 05/09/2015.  PLAN: Sallye is now a little over a year out from definitive surgery for her breast cancer with no evidence of disease recurrence. This is very favorable.  Her ejection fraction has recovered and we could consider going back on anti-HER-2 treatment, but given her difficulty getting to appointments and her multiple other medical problems which I think take precedence, I think the better part of valor is to simply observe. If we need to proceed to either HER-2 treatment because of measurable disease recurrence we would be able to do so.  Her chronic myeloid leukemia is responding very well to imatinib and it is important to make sure that she continues to receive that. She tells me she had difficulty recalling to get that accomplished. I will alert  our oral chemotherapy pharmacist regarding her  situation.  Otherwise she will see me again in 6 months. She will call for any problems that may develop before her next visit here.  Chauncey Cruel, MD   06/21/2016 3:43 PM

## 2016-06-23 ENCOUNTER — Encounter (HOSPITAL_COMMUNITY): Payer: Medicare Other

## 2016-06-23 ENCOUNTER — Ambulatory Visit (HOSPITAL_COMMUNITY): Admission: RE | Admit: 2016-06-23 | Payer: Medicare Other | Source: Ambulatory Visit

## 2016-07-06 ENCOUNTER — Ambulatory Visit (HOSPITAL_BASED_OUTPATIENT_CLINIC_OR_DEPARTMENT_OTHER)
Admission: RE | Admit: 2016-07-06 | Discharge: 2016-07-06 | Disposition: A | Discharge status: Home / Self Care | Payer: Medicare Other | Source: Ambulatory Visit | Attending: Cardiology | Admitting: Cardiology

## 2016-07-06 ENCOUNTER — Encounter (HOSPITAL_COMMUNITY): Payer: Self-pay

## 2016-07-06 ENCOUNTER — Ambulatory Visit (HOSPITAL_COMMUNITY)
Admission: RE | Admit: 2016-07-06 | Discharge: 2016-07-06 | Disposition: A | Discharge status: Home / Self Care | Payer: Medicare Other | Source: Ambulatory Visit | Attending: Cardiology | Admitting: Cardiology

## 2016-07-06 VITALS — BP 136/82 | HR 62 | Wt 117.0 lb

## 2016-07-06 DIAGNOSIS — I119 Hypertensive heart disease without heart failure: Secondary | ICD-10-CM | POA: Insufficient documentation

## 2016-07-06 DIAGNOSIS — I427 Cardiomyopathy due to drug and external agent: Secondary | ICD-10-CM | POA: Diagnosis not present

## 2016-07-06 DIAGNOSIS — I340 Nonrheumatic mitral (valve) insufficiency: Secondary | ICD-10-CM | POA: Diagnosis not present

## 2016-07-06 DIAGNOSIS — J849 Interstitial pulmonary disease, unspecified: Secondary | ICD-10-CM

## 2016-07-06 DIAGNOSIS — T451X5A Adverse effect of antineoplastic and immunosuppressive drugs, initial encounter: Secondary | ICD-10-CM | POA: Diagnosis not present

## 2016-07-06 DIAGNOSIS — Z09 Encounter for follow-up examination after completed treatment for conditions other than malignant neoplasm: Secondary | ICD-10-CM | POA: Diagnosis present

## 2016-07-06 NOTE — Progress Notes (Signed)
Patient ID: Pamela House, female   DOB: 1930-09-12, 82 y.o.   MRN: [id number] PCP: Dr. Lysle Rubens Oncologist: Dr. Jana Hakim (breast CA), Dr Earlie Server [facility]) HF MD: Dr Aundra Dubin.  Pulmonary: Dr Chase Caller  82 yo with history of CML, breast cancer, HTN, and mild-moderate dementia presents for cardio-oncology evaluation given fall in EF while getting Herceptin therapy.  Patient has CML and has been on imatinib for an extended period.  In 5/16 she was found to have breast cancer, ER-/PR-/HER2+.  She had a lumpectomy in 6/16 and declined chemotherapy.  She has been getting Herceptin every 3 wks to be continued to 7/17.  Initial echo in 6/16 showed normal LV function.  Echo in 10/16 showed a mild fall in EF.  Echo in 1/17 has showed a more significant fall in EF to 45-50% along with a corresponding drop in global longitudinal strain.   Patient does not seem to have a significant cardiac history.  She says she had a remote catheterization but does not remember any of the details of this.  She was seen 09/15/15 in the ER and thought to have LLL PNA.  Main symptom was cough.    She returns for follow up. She no longer receiving HER2 treatment. Plan to observe for disease recurrence. Overall feels ok. SOB with exertion, no change. Denies PND/Orthopnea. Ongoing cough.  Not weighing at home because she doesn't have scales. Appetite fair. Taking all medications.   Labs (12/16): K 4.1, creatinine 0.9 Labs (2/17): K 4, creatinine 1.1 Labs (6/17): ANA negative, anti-SCL 70 negative, RF negative, p-ANCA weakly positive at 1:40.   PMH: 1. Hyperlipidemia: Myalgias with Lipitor.  2. Mild-moderate dementia 3. HTN 4. Degenerative disc disease 5. GERD 6. CML: She has been on imatinib.  Followed by Dr. Earlie Server.  7. Breast cancer: Diagnosed in 5/16.  ER-/PR-/HER2+.  Lumpectomy 6/16, declined chemotherapy. She is getting Herceptin every 3 wks to be continued to 7/17.  - Echo (6/16): EF 55-60%, GLS -19%. - Echo (10/16): EF  50-55%, GLS -21.9%. - Echo (1/17): EF 45-50%, GLS -15.7%.  - Echo  (2/17): EF 45%, GLS -12.1%.  - Echo (7/17): EF 60-65%, lateral s' 10.1 cm/sec, GLS -23.7%, RV mildly dilated with mildly decreased systolic function.  - Echo (10/17): EF 55-60%, mild LVH, normal RV size with mildly decreased systolic function.  8. Interstitial fibrosis: Noted on prior CT and CXR.  - High resolution chest CT (3/17) with idiopathic pulmonary fibrosis pattern. - PFTs (5/17) with severe restriction and severely decreased DLCO.   SH: Lives alone, 1 living daughter, nonsmoker.   FH: Ovarian cancer  ROS: All systems reviewed and negative except as per HPI.  Current Outpatient Prescriptions  Medication Sig Dispense Refill  . albuterol (PROAIR HFA) 108 (90 Base) MCG/ACT inhaler Inhale 2 puffs into the lungs every 6 (six) hours as needed for wheezing or shortness of breath. 8 g 3  . ALPRAZolam (XANAX) 0.5 MG tablet Take 0.5 mg by mouth 2 (two) times daily as needed for anxiety.     Marland Kitchen aspirin 81 MG tablet Take 81 mg by mouth daily.    . carvedilol (COREG) 12.5 MG tablet Take 1.5 tablets (18.75 mg total) by mouth 2 (two) times daily. 90 tablet 6  . diphenoxylate-atropine (LOMOTIL) 2.5-0.025 MG tablet Take 1 tablet by mouth 4 (four) times daily as needed for diarrhea or loose stools. 30 tablet 0  . donepezil (ARICEPT) 5 MG tablet Take 5 mg by mouth at bedtime.    Marland Kitchen  fluticasone (FLONASE) 50 MCG/ACT nasal spray Place 1 spray into both nostrils daily as needed for allergies or rhinitis. Reported on 09/09/2015    . imatinib (GLEEVEC) 400 MG tablet Take 1 tablet (400 mg total) by mouth See admin instructions. Take with meals and large glass of water.Caution:Chemotherapy. Takes on Tues, thurs, sat, sun 30 tablet 3  . omeprazole (PRILOSEC) 40 MG capsule Take 1 capsule (40 mg total) by mouth 2 (two) times daily before a meal. (Patient taking differently: Take 40 mg by mouth daily. ) 60 capsule 5  . Probiotic Product (ALIGN PO)  Take by mouth.    . promethazine (PHENERGAN) 25 MG tablet Take 25 mg by mouth 4 (four) times daily.    . valsartan (DIOVAN) 160 MG tablet Take 160 mg by mouth daily.     . Vitamin D, Ergocalciferol, (DRISDOL) 50000 units CAPS capsule Take 50,000 Units by mouth every 30 (thirty) days.    . furosemide (LASIX) 20 MG tablet Take 20 mg by mouth as needed.    . potassium chloride (K-DUR,KLOR-CON) 10 MEQ tablet Take 10 mEq by mouth as needed (with lasix.).     No current facility-administered medications for this encounter.    BP 136/82   Pulse 62   Wt 117 lb (53.1 kg)   SpO2 100%   BMI 20.73 kg/m  General: NAD Neck: No JVD, no thyromegaly or thyroid nodule.  Lungs: Dry crackles at bases bilaterally. CV: Nondisplaced PMI.  Heart regular S1/S2, no S3/S4, 1/6 SEM RUSB.  No peripheral edema.  No carotid bruit.  Normal pedal pulses.  Abdomen: Soft, nontender, no hepatosplenomegaly, no distention.  Skin: Intact without lesions or rashes.  Neurologic: Alert and oriented x 3.  Psych: Normal affect. Extremities: No clubbing or cyanosis.  HEENT: Normal.   Assessment/Plan: 1. Herceptin Cardiomyopathy: EF and global longitudinal strain fell with initiation of Herceptin. EF down to 45-50% on 1/17 echo and Herceptin was held.  In February EF down to 45%. Today ECHO was reviewed and discussed. EF improved 55-60% no that she Is off Herceptin.   She is not volume overloaded on exam.  - No further HER2 treatment planned.  - Continue lasix as needed.  - Continue valsartan.  - Continue coreg to 18.75 . Does not tolerate 25 mg twice a day due to dizziness.  2. HTN: Ok.  Did not tolerate uptitration of coreg.  3.Pulmonary Fibrosis: Follows with pulmonary, has decided against pirfenidone treatment.   Follow up in 1 year.    Amy Clegg NP-C  07/06/2016   Patient seen with NP, agree with the above note.  She is now off Herceptin with no plan to restart.  Echo reviewed and shows EF recovered to 55-60%.   Reasonable to continue Coreg and valsartan, especially given history of HTN.   - Followup 1 year.   [name] 07/07/2016

## 2016-07-06 NOTE — Progress Notes (Signed)
*  PRELIMINARY RESULTS* Echocardiogram 2D Echocardiogram has been performed.  Leavy Cella 07/06/2016, 3:23 PM

## 2016-07-06 NOTE — Patient Instructions (Signed)
No changes or lab work today.  Follow up 1 year with Dr. Aundra Dubin.  Do the following things EVERYDAY: 1) Weigh yourself in the morning before breakfast. Write it down and keep it in a log. 2) Take your medicines as prescribed 3) Eat low salt foods-Limit salt (sodium) to 2000 mg per day.  4) Stay as active as you can everyday 5) Limit all fluids for the day to less than 2 liters

## 2016-08-11 IMAGING — CR DG CHEST 2V
2 series · 2 of 2 positions shown · non-contrast
Comparison: Chest x-ray 09/15/2015 and 06/12/2014, as well as CT
chest of 05/21/2013

CLINICAL DATA: Cough, chest congestion, shortness of breath,
history of left breast carcinoma in Tuesday April, 2015

EXAM:
CHEST  2 VIEW

[w chest pa]
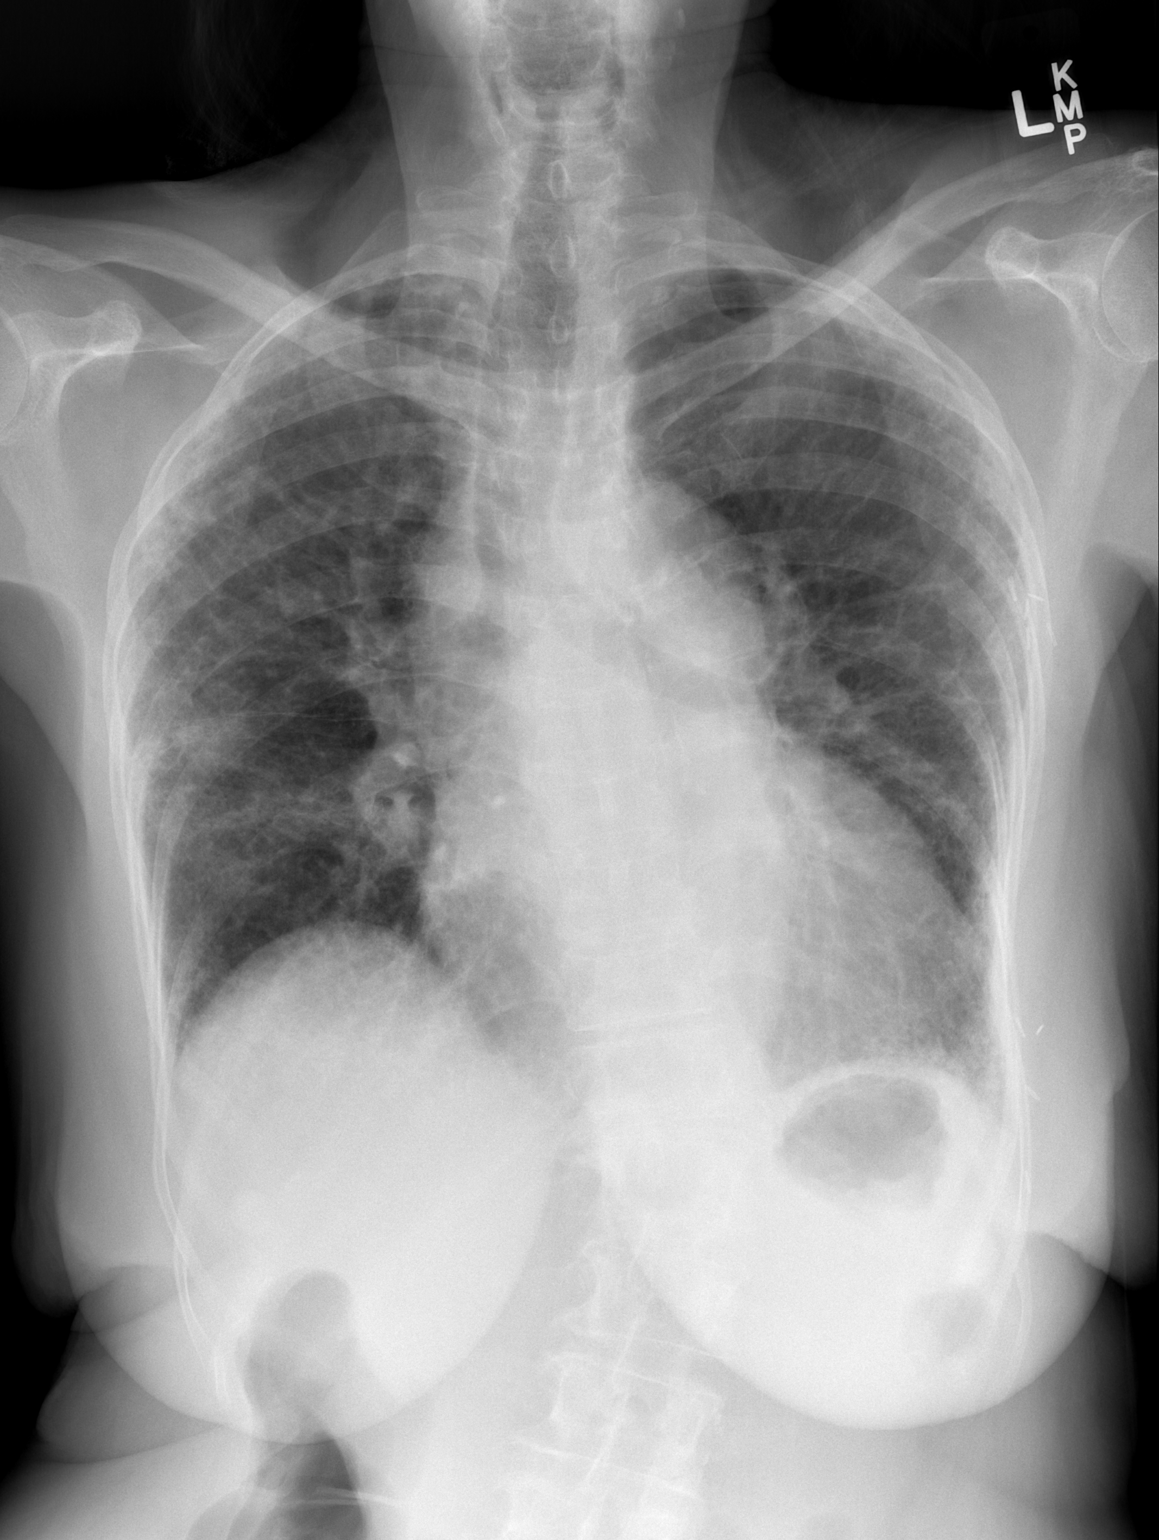

[w chest lat]
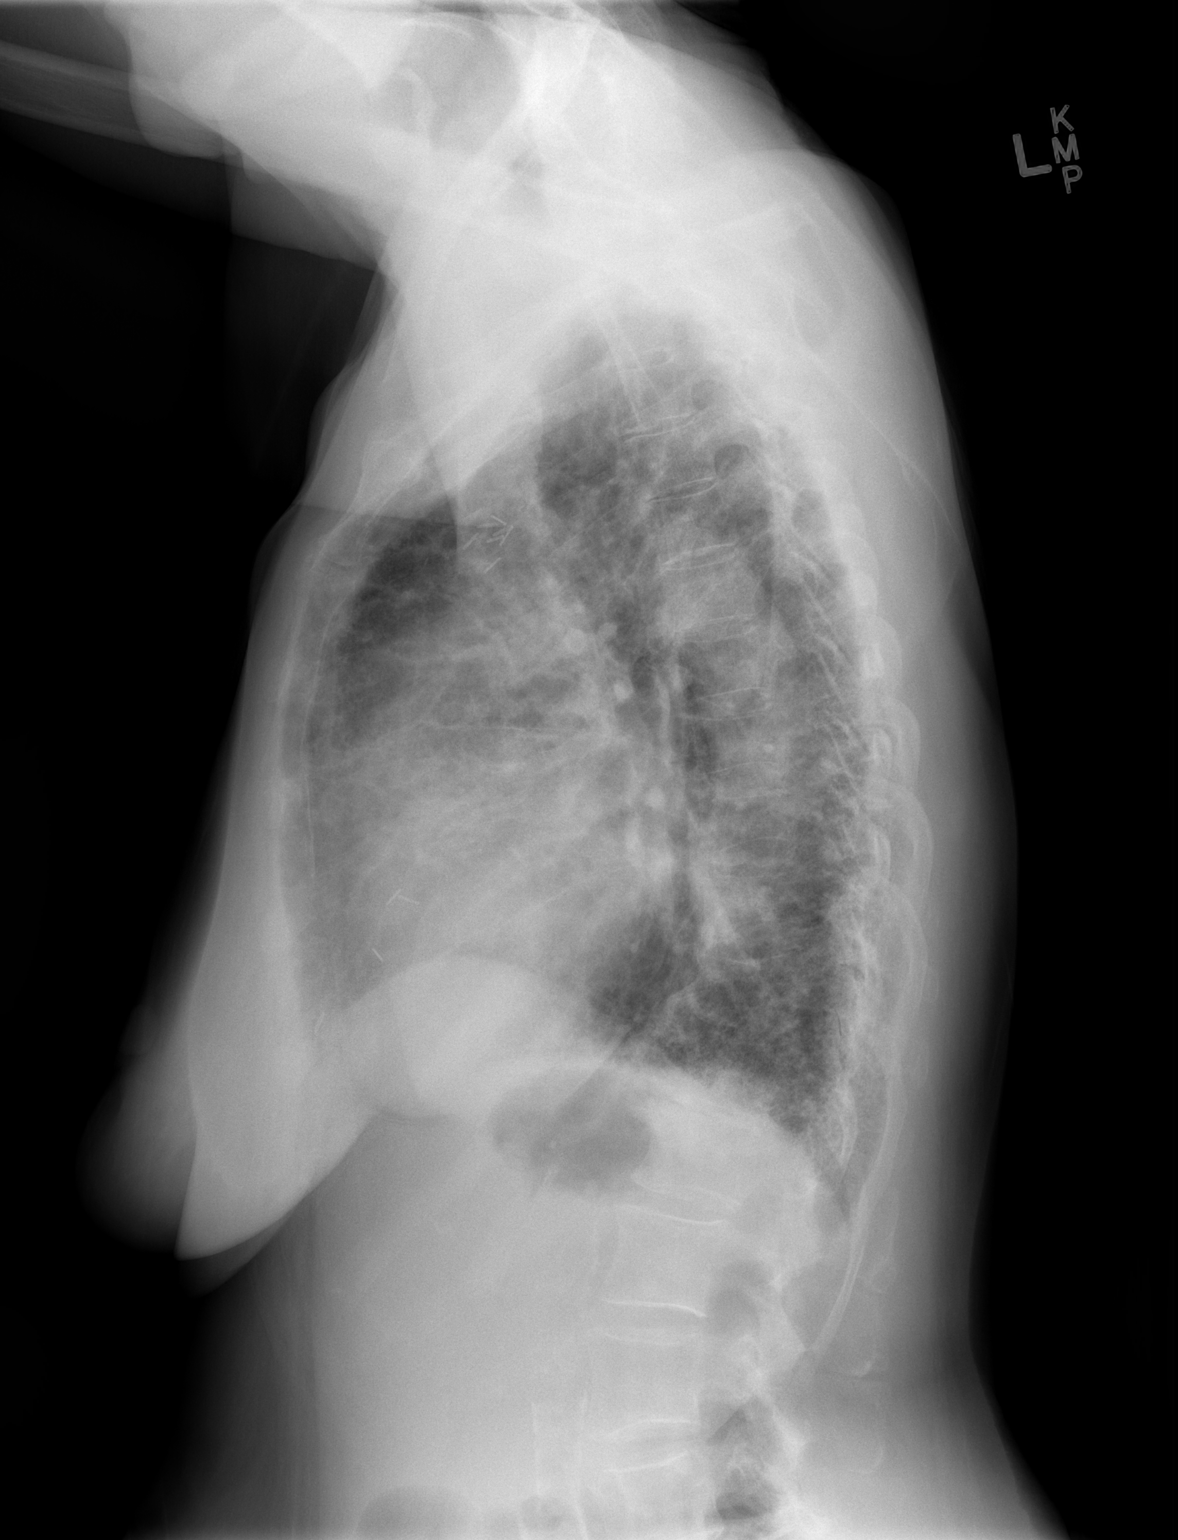

[2 of 2 positions shown; findings below may reference images not displayed]

FINDINGS: In review of the CT of the chest from 05/21/2013, the fibrotic
pattern was considered to be suggestive of nonspecific interstitial
pneumonitis ( NSIP) at that time. The coarse and prominent
interstitial markings are primarily peripheral in location, and this
may represent progression of interstitial lung disease. No active
infiltrate or effusion is seen. Mediastinal and hilar contours are
unchanged and cardiomegaly is stable. Thoracolumbar scoliosis is
noted.
IMPRESSION: Probable progression of interstitial lung disease. No definite
active process.

## 2016-10-02 ENCOUNTER — Other Ambulatory Visit: Payer: Self-pay | Admitting: Nurse Practitioner

## 2016-10-06 LAB — BCR/ABL (LIO MMD)

## 2016-11-10 ENCOUNTER — Other Ambulatory Visit (HOSPITAL_COMMUNITY): Payer: Self-pay | Admitting: Cardiology

## 2016-11-10 DIAGNOSIS — T451X5A Adverse effect of antineoplastic and immunosuppressive drugs, initial encounter: Principal | ICD-10-CM

## 2016-11-10 DIAGNOSIS — I427 Cardiomyopathy due to drug and external agent: Secondary | ICD-10-CM

## 2016-12-10 ENCOUNTER — Other Ambulatory Visit: Payer: Self-pay | Admitting: Pulmonary Disease

## 2016-12-17 ENCOUNTER — Other Ambulatory Visit: Payer: Self-pay | Admitting: *Deleted

## 2016-12-17 DIAGNOSIS — Z171 Estrogen receptor negative status [ER-]: Principal | ICD-10-CM

## 2016-12-17 DIAGNOSIS — C50412 Malignant neoplasm of upper-outer quadrant of left female breast: Secondary | ICD-10-CM

## 2016-12-20 ENCOUNTER — Telehealth: Payer: Self-pay | Admitting: Oncology

## 2016-12-20 ENCOUNTER — Other Ambulatory Visit: Payer: Medicare Other

## 2016-12-20 ENCOUNTER — Ambulatory Visit: Payer: Medicare Other | Admitting: Oncology

## 2016-12-20 NOTE — Progress Notes (Signed)
No show due to GI upset-- rescheduled

## 2016-12-20 NOTE — Telephone Encounter (Signed)
Patient dtr stopped to reschedule today's lab/fu to 5/2. Patient not able to make appointment due to diarrhea.

## 2017-01-12 ENCOUNTER — Other Ambulatory Visit: Payer: Self-pay

## 2017-01-12 ENCOUNTER — Other Ambulatory Visit (HOSPITAL_BASED_OUTPATIENT_CLINIC_OR_DEPARTMENT_OTHER): Payer: Medicare Other

## 2017-01-12 ENCOUNTER — Ambulatory Visit (HOSPITAL_BASED_OUTPATIENT_CLINIC_OR_DEPARTMENT_OTHER): Payer: Medicare Other | Admitting: Oncology

## 2017-01-12 VITALS — BP 182/68 | HR 68 | Temp 98.1°F | Resp 18 | Ht 63.0 in | Wt 116.0 lb

## 2017-01-12 DIAGNOSIS — C9210 Chronic myeloid leukemia, BCR/ABL-positive, not having achieved remission: Secondary | ICD-10-CM | POA: Diagnosis not present

## 2017-01-12 DIAGNOSIS — C50412 Malignant neoplasm of upper-outer quadrant of left female breast: Secondary | ICD-10-CM

## 2017-01-12 DIAGNOSIS — Z171 Estrogen receptor negative status [ER-]: Secondary | ICD-10-CM

## 2017-01-12 LAB — CBC WITH DIFFERENTIAL/PLATELET
BASO%: 0.7 % (ref 0.0–2.0)
BASOS ABS: 0 10*3/uL (ref 0.0–0.1)
EOS ABS: 0.2 10*3/uL (ref 0.0–0.5)
EOS%: 6 % (ref 0.0–7.0)
HEMATOCRIT: 34.9 % (ref 34.8–46.6)
HEMOGLOBIN: 11.5 g/dL — AB (ref 11.6–15.9)
LYMPH#: 1.3 10*3/uL (ref 0.9–3.3)
LYMPH%: 43.1 % (ref 14.0–49.7)
MCH: 31.8 pg (ref 25.1–34.0)
MCHC: 32.9 g/dL (ref 31.5–36.0)
MCV: 96.6 fL (ref 79.5–101.0)
MONO#: 0.3 10*3/uL (ref 0.1–0.9)
MONO%: 11.1 % (ref 0.0–14.0)
NEUT%: 39.1 % (ref 38.4–76.8)
NEUTROS ABS: 1.2 10*3/uL — AB (ref 1.5–6.5)
Platelets: 254 10*3/uL (ref 145–400)
RBC: 3.61 10*6/uL — ABNORMAL LOW (ref 3.70–5.45)
RDW: 14.6 % — AB (ref 11.2–14.5)
WBC: 3.1 10*3/uL — AB (ref 3.9–10.3)

## 2017-01-12 LAB — COMPREHENSIVE METABOLIC PANEL
ALBUMIN: 4 g/dL (ref 3.5–5.0)
ALK PHOS: 85 U/L (ref 40–150)
ALT: 15 U/L (ref 0–55)
AST: 27 U/L (ref 5–34)
Anion Gap: 7 mEq/L (ref 3–11)
BILIRUBIN TOTAL: 0.58 mg/dL (ref 0.20–1.20)
BUN: 13.8 mg/dL (ref 7.0–26.0)
CALCIUM: 10 mg/dL (ref 8.4–10.4)
CO2: 28 mEq/L (ref 22–29)
CREATININE: 1.1 mg/dL (ref 0.6–1.1)
Chloride: 102 mEq/L (ref 98–109)
EGFR: 54 mL/min/{1.73_m2} — ABNORMAL LOW (ref >90)
GLUCOSE: 93 mg/dL (ref 70–140)
Potassium: 4.5 mEq/L (ref 3.5–5.1)
SODIUM: 138 meq/L (ref 136–145)
Total Protein: 6.9 g/dL (ref 6.4–8.3)

## 2017-01-12 MED ORDER — OMEPRAZOLE 20 MG PO CPDR: 20.0000 mg | DELAYED_RELEASE_CAPSULE | Freq: Two times a day (BID) | ORAL | 4 refills | Status: DC

## 2017-01-12 MED ORDER — IMATINIB MESYLATE 400 MG PO TABS: 400.0000 mg | ORAL_TABLET | ORAL | 3 refills | Status: DC
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
## 2017-01-13 ENCOUNTER — Other Ambulatory Visit: Payer: Self-pay | Admitting: Oncology

## 2017-01-13 DIAGNOSIS — C9210 Chronic myeloid leukemia, BCR/ABL-positive, not having achieved remission: Secondary | ICD-10-CM

## 2017-01-14 ENCOUNTER — Encounter: Payer: Self-pay | Admitting: *Deleted
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
## 2017-01-18 ENCOUNTER — Encounter: Payer: Self-pay | Admitting: *Deleted

## 2017-01-18 ENCOUNTER — Other Ambulatory Visit: Payer: Self-pay | Admitting: Pulmonary Disease

## 2017-01-18 ENCOUNTER — Other Ambulatory Visit: Payer: Self-pay

## 2017-01-18 MED ORDER — OMEPRAZOLE 20 MG PO CPDR: 20.0000 mg | DELAYED_RELEASE_CAPSULE | Freq: Two times a day (BID) | ORAL | 4 refills | Status: DC
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
## 2017-01-21 ENCOUNTER — Telehealth: Payer: Self-pay | Admitting: Pharmacist

## 2017-01-21 DIAGNOSIS — C9210 Chronic myeloid leukemia, BCR/ABL-positive, not having achieved remission: Secondary | ICD-10-CM

## 2017-01-21 MED ORDER — IMATINIB MESYLATE 400 MG PO TABS: ORAL_TABLET | ORAL | 3 refills | Status: DC

## 2017-01-21 NOTE — Telephone Encounter (Signed)
Oral Chemotherapy Pharmacist Encounter  Received call from [place] that patient has requested [place] [place] (imatinib) prescription be filled at [facility], as this is where she has been getting this prescription filled.  Orlovista prescription has been e-scribed to [facility].  Johny Drilling, PharmD, BCPS, BCOP 01/21/2017  4:26 PM [facility] [phone]

## 2017-02-24 LAB — BCR/ABL (LIO MMD)

## 2017-03-29 ENCOUNTER — Telehealth: Payer: Self-pay

## 2017-03-29 NOTE — Telephone Encounter (Signed)
Pt's dtr called inquiring about the status of her FMLA papers.  Inbasket msg sent to Bryson Corona for f/u

## 2017-04-19 ENCOUNTER — Encounter: Payer: Self-pay | Admitting: Adult Health

## 2017-04-19 ENCOUNTER — Ambulatory Visit (HOSPITAL_BASED_OUTPATIENT_CLINIC_OR_DEPARTMENT_OTHER): Payer: Medicare Other | Admitting: Adult Health

## 2017-04-19 ENCOUNTER — Other Ambulatory Visit (HOSPITAL_BASED_OUTPATIENT_CLINIC_OR_DEPARTMENT_OTHER): Payer: Medicare Other

## 2017-04-19 VITALS — BP 185/67 | HR 55 | Temp 98.3°F | Resp 17 | Ht 63.0 in | Wt 112.4 lb

## 2017-04-19 DIAGNOSIS — C50412 Malignant neoplasm of upper-outer quadrant of left female breast: Secondary | ICD-10-CM | POA: Diagnosis not present

## 2017-04-19 DIAGNOSIS — Z171 Estrogen receptor negative status [ER-]: Principal | ICD-10-CM

## 2017-04-19 DIAGNOSIS — C9210 Chronic myeloid leukemia, BCR/ABL-positive, not having achieved remission: Secondary | ICD-10-CM

## 2017-04-19 LAB — COMPREHENSIVE METABOLIC PANEL
ALT: 12 U/L (ref 0–55)
ANION GAP: 5 meq/L (ref 3–11)
AST: 23 U/L (ref 5–34)
Albumin: 3.9 g/dL (ref 3.5–5.0)
Alkaline Phosphatase: 79 U/L (ref 40–150)
BUN: 8.7 mg/dL (ref 7.0–26.0)
CALCIUM: 9.7 mg/dL (ref 8.4–10.4)
CHLORIDE: 101 meq/L (ref 98–109)
CO2: 29 meq/L (ref 22–29)
Creatinine: 1 mg/dL (ref 0.6–1.1)
EGFR: 63 mL/min/{1.73_m2} — ABNORMAL LOW (ref >90)
Glucose: 85 mg/dl (ref 70–140)
POTASSIUM: 4.1 meq/L (ref 3.5–5.1)
Sodium: 135 mEq/L — ABNORMAL LOW (ref 136–145)
Total Bilirubin: 0.38 mg/dL (ref 0.20–1.20)
Total Protein: 6.9 g/dL (ref 6.4–8.3)

## 2017-04-19 LAB — CBC WITH DIFFERENTIAL/PLATELET
BASO%: 0.3 % (ref 0.0–2.0)
BASOS ABS: 0 10*3/uL (ref 0.0–0.1)
EOS%: 5.7 % (ref 0.0–7.0)
Eosinophils Absolute: 0.2 10*3/uL (ref 0.0–0.5)
HEMATOCRIT: 34 % — AB (ref 34.8–46.6)
HGB: 11.4 g/dL — ABNORMAL LOW (ref 11.6–15.9)
LYMPH#: 1.1 10*3/uL (ref 0.9–3.3)
LYMPH%: 31.7 % (ref 14.0–49.7)
MCH: 31.2 pg (ref 25.1–34.0)
MCHC: 33.5 g/dL (ref 31.5–36.0)
MCV: 93.2 fL (ref 79.5–101.0)
MONO#: 0.2 10*3/uL (ref 0.1–0.9)
MONO%: 6 % (ref 0.0–14.0)
NEUT#: 2 10*3/uL (ref 1.5–6.5)
NEUT%: 56.3 % (ref 38.4–76.8)
Platelets: 258 10*3/uL (ref 145–400)
RBC: 3.65 10*6/uL — AB (ref 3.70–5.45)
RDW: 13.9 % (ref 11.2–14.5)
WBC: 3.5 10*3/uL — ABNORMAL LOW (ref 3.9–10.3)

## 2017-04-19 NOTE — Progress Notes (Signed)
[place]  Telephone:(336) [phone] Fax:(336) [phone]     ID: Pamela House DOB: 1929-11-15  MR#: [id number]  QRF#:[id number]  Patient Care Team: Pamela House as PCP - General (Internal Medicine) House, Pamela Dad, House as Consulting Physician (Oncology) Pamela Kussmaul, House as Consulting Physician (General Surgery) Pamela Dresser, House as Consulting Physician (Cardiology) PCP: Pamela House GYN: OTHER House:  CHIEF COMPLAINT: b estrogen receptor negative breast cancer ; chronic myeloid leukemia  CURRENT TREATMENT:  Gleevec Tuesday, Thursday, Saturday, and Sunday  BREAST CANCER HISTORY: From the original intake note:  Pamela House has a history of chronic myeloid leukemia followed by Pamela House. She is treated with imatinib and her most recent BCR.ABL/ABL ratio was nearly unmeasurable.   On 12/25/2014 the patient had routine bilateral screening mammography at the breast Center, showing a breast density category B. There was a possible mass in the left breast and the patient was recalled for left diagnostic mammography with tomosynthesis and left breast ultrasonography 01/02/2015. This showed an 8 mm mass with indistinct margins in the posterior third of the outer left breast, which was palpable at the 2:30 position 8 cm from the nipple. Ultrasound confirmed an oval solid mass measuring 8 mm. Ultrasound of the left axilla showed no abnormal lymphadenopathy.  Biopsy of the left breast mass in question 01/23/2015 showed ([id number]) an invasive ductal carcinoma, grade 3, estrogen and progesterone receptor negative, but with HER-2 amplification, the signals ratio being 2.38, and the number per cell 3.45. MIB-1 was 79%.  The patient's case was presented at the multidisciplinary breast cancer conference 01/29/2015. At that time it was felt that breast conserving surgery would be the first step, to be followed by adjuvant chemotherapy and anti-HER-2  immunotherapy.  The patient's subsequent history is as detailed below  INTERVAL HISTORY:  Pamela House returns today for follow-up her h/o breast cancer and CML.  She is accompanied by her daughter Pamela House today.  She is taking Gleevec Tuesday, Thursday, Saturday, and Sundays.  She is having problems with her anti-hypertensives and her blood pressure is very elevated.  She is going to see Dr. Lorenda House at 415 today to get some additional anti hypertensives.    REVIEW OF SYSTEMS:  Pamela House has missed her mammogram that was due in July of this year.  She has also lost 6 pounds since 06/2016.  She says that she is drinking two ensure per day.  Otherwise, a detailed ROS was non contributory.  PAST MEDICAL HISTORY: Past Medical History:  Diagnosis Date  . Allergic rhinitis   . Anxiety   . DDD (degenerative disc disease)   . Dementia   . Dyslipidemia   . GERD (gastroesophageal reflux disease)   . Hypertension   . Leukemia (Pamela House) 2010  . Osteoporosis   . Radiation 04/30/15-05/1515   Left breast  . Vitamin D deficiency     PAST SURGICAL HISTORY: Past Surgical History:  Procedure Laterality Date  . BACK SURGERY  1984  . BREAST LUMPECTOMY WITH RADIOACTIVE SEED AND SENTINEL LYMPH NODE BIOPSY Left 02/27/2015   Procedure: LEFT BREAST LUMPECTOMY WITH RADIOACTIVE SEED AND SENTINEL LYMPH NODE BIOPSY;  Surgeon: Pamela Messing III, House;  Location: [facility];  Service: General;  Laterality: Left;    FAMILY HISTORY No family history on file. The patient's father died at the age of 41. The patient's mother died in her 83E from complications of diabetes. The patient had one full brother and one full sister there  this fall sister had ovarian cancer diagnosed late in life. The patient had 14 additional half siblings. There is no other history of breast cancer in the family.  GYNECOLOGIC HISTORY:  No LMP recorded. Patient has had a hysterectomy. Menarche age 11, first live birth age 39. She is GX P5. [name] is not sure when she went through the change of life, but she had a hysterectomy remotely. She is not sure whether the ovaries were removed. She did not take hormone replacement.  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 Her husband died in 11/16/99 and she lives by herself, with no pets, in the [employer] school apartments off [address]. There are exercise groups there but not a gym. Her daughter Pamela House is Agricultural consultant for a shipping company in [place]. Granddaughter Pamela House (pronounced "Trey Paula") is a Education officer, museum for the child protective services in [place]. The patient has 2 sons who have died. She attends a [employer].    ADVANCED DIRECTIVES: Her daughter Pamela House is her healthcare power of attorney. She can be reached at [phone].   HEALTH MAINTENANCE: Social History  Substance Use Topics  . Smoking status: Never Smoker  . Smokeless tobacco: Not on file  . Alcohol use No     Colonoscopy:  PAP:  Bone density:  Lipid panel:  Allergies  Allergen Reactions  . Ace Inhibitors Cough  . Hyzaar [Losartan Potassium-Hctz] Cough  . Lipitor [Atorvastatin]     Muscle fatigue  . Losartan Cough  . Tiazac [Diltiazem Hcl Er Beads] Cough    Current Outpatient Prescriptions  Medication Sig Dispense Refill  . albuterol (PROAIR HFA) 108 (90 Base) MCG/ACT inhaler Inhale 2 puffs into the lungs every 6 (six) hours as needed for wheezing or shortness of breath. 8 g 3  . ALPRAZolam (XANAX) 0.5 MG tablet Take 0.5 mg by mouth 2 (two) times daily as needed for anxiety.     Marland Kitchen aspirin 81 MG tablet Take 81 mg by mouth daily.    . carvedilol (COREG) 12.5 MG tablet TAKE 1 AND 1/2 TABLETS BY MOUTH TWICE DAILY 90 tablet 11  . donepezil (ARICEPT) 5 MG tablet Take 5 mg by mouth at bedtime.    . fluticasone (FLONASE) 50 MCG/ACT nasal spray Place 1 spray into both nostrils daily as needed for allergies or rhinitis. Reported  on 09/09/2015    . furosemide (LASIX) 20 MG tablet Take 20 mg by mouth as needed.    . imatinib (GLEEVEC) 400 MG tablet Take 1 tablet by mouth once daily with a large glass of water. 30 tablet 3  . omeprazole (PRILOSEC) 20 MG capsule Take 1 capsule (20 mg total) by mouth 2 (two) times daily before a meal. 60 capsule 4  . omeprazole (PRILOSEC) 40 MG capsule take 1 capsule by mouth twice a day before A MEAL 60 capsule 1  . potassium chloride (K-DUR,KLOR-CON) 10 MEQ tablet Take 10 mEq by mouth as needed (with lasix.).    Marland Kitchen Probiotic Product (ALIGN PO) Take by mouth.    . promethazine (PHENERGAN) 25 MG tablet Take 25 mg by mouth 4 (four) times daily.    . Vitamin D, Ergocalciferol, (DRISDOL) 50000 units CAPS capsule Take 50,000 Units by mouth every 30 (thirty) days.     No current facility-administered medications for this visit.     OBJECTIVE:   Vitals:   04/19/17 1447  BP: (!) 185/67  Pulse: Marland Kitchen)  55  Resp: 17  Temp: 98.3 F (36.8 C)     Body mass index is 19.91 kg/m.    ECOG FS:1 - Symptomatic but completely ambulatory GENERAL: Patient is a well appearing female in no acute distress HEENT:  Sclerae anicteric.  Oropharynx clear and moist. No ulcerations or evidence of oropharyngeal candidiasis. Neck is supple.  NODES:  No cervical, supraclavicular, or axillary lymphadenopathy palpated.  BREAST EXAM:  Right breast without nodules, masses, skin or nipple changes, left breast lumpectomy site is well healed, mild scar tissue present, no nodules, masses, skin/nipple changes LUNGS:  Clear to auscultation bilaterally.  No wheezes or rhonchi. HEART:  Regular rate and rhythm. No murmur appreciated. ABDOMEN:  Soft, nontender.  Positive, normoactive bowel sounds. No organomegaly palpated. MSK:  + focal thoracic spinal tenderness to palpation. Full range of motion bilaterally in the upper extremities. EXTREMITIES:  No peripheral edema.   SKIN:  Clear with no obvious rashes or skin changes. No nail  dyscrasia. NEURO:  Nonfocal. Well oriented.  Appropriate affect.    LAB RESULTS:  CMP     Component Value Date/Time   NA 138 01/12/2017 1344   K 4.5 01/12/2017 1344   CL 103 03/23/2016 1259   CL 106 02/21/2013 1355   CO2 28 01/12/2017 1344   GLUCOSE 93 01/12/2017 1344   GLUCOSE 92 02/21/2013 1355   BUN 13.8 01/12/2017 1344   CREATININE 1.1 01/12/2017 1344   CALCIUM 10.0 01/12/2017 1344   PROT 6.9 01/12/2017 1344   ALBUMIN 4.0 01/12/2017 1344   AST 27 01/12/2017 1344   ALT 15 01/12/2017 1344   ALKPHOS 85 01/12/2017 1344   BILITOT 0.58 01/12/2017 1344   GFRNONAA >60 03/23/2016 1259   GFRAA >60 03/23/2016 1259    INo results found for: SPEP, UPEP  Lab Results  Component Value Date   WBC 3.1 (L) 01/12/2017   NEUTROABS 1.2 (L) 01/12/2017   HGB 11.5 (L) 01/12/2017   HCT 34.9 01/12/2017   MCV 96.6 01/12/2017   PLT 254 01/12/2017      Chemistry      Component Value Date/Time   NA 138 01/12/2017 1344   K 4.5 01/12/2017 1344   CL 103 03/23/2016 1259   CL 106 02/21/2013 1355   CO2 28 01/12/2017 1344   BUN 13.8 01/12/2017 1344   CREATININE 1.1 01/12/2017 1344      Component Value Date/Time   CALCIUM 10.0 01/12/2017 1344   ALKPHOS 85 01/12/2017 1344   AST 27 01/12/2017 1344   ALT 15 01/12/2017 1344   BILITOT 0.58 01/12/2017 1344       No results found for: LABCA2  No components found for: LABCA125  No results for input(s): INR in the last 168 hours.  Urinalysis    Component Value Date/Time   COLORURINE YELLOW 06/12/2014 2231   APPEARANCEUR CLEAR 06/12/2014 2231   LABSPEC 1.010 10/21/2015 1135   PHURINE 5.0 10/21/2015 1135   PHURINE 6.5 06/12/2014 2231   GLUCOSEU Negative 10/21/2015 1135   HGBUR Negative 10/21/2015 1135   HGBUR NEGATIVE 06/12/2014 2231   BILIRUBINUR Negative 10/21/2015 1135   KETONESUR Negative 10/21/2015 1135   KETONESUR NEGATIVE 06/12/2014 2231   PROTEINUR Negative 10/21/2015 1135   PROTEINUR NEGATIVE 06/12/2014 2231    UROBILINOGEN 0.2 10/21/2015 1135   NITRITE Negative 10/21/2015 1135   NITRITE NEGATIVE 06/12/2014 2231   LEUKOCYTESUR Negative 10/21/2015 1135    STUDIES: Repeat mammography due July 2018.  ASSESSMENT: 37 y.o. [place] woman status post left  breast Upper outer quadrant biopsy 01/23/2015 for a clinical T1b N0, stage IA invasive ductal carcinoma, grade 3, estrogen and progesterone receptor negative, but HER-2 amplified, with an MIB-1 of 79%  (1) genetics testing requested--patient with breast cancer, family history of ovarian cancer  (2) left lumpectomy with sentinel lymph node biopsy on 02/27/2015  (3) adjuvant anti-HER-2 immunotherapy with trastuzumab every 3 weeks through 1 year, started 03/28/15. Patient declined chemotherapy (abraxane).  (a) echocardiogram 09/29/2015 showed an ejection fraction of 45-50%, with diffuse hypokinesis. Herceptin interrupted after 09/09/2015 dose  (b) echocardiogram of 03/23/2016 shows an ejection fraction of 60-65%.  (4) radiation 04/30/15-05/29/15: Left breast/ 42.72 Gy at 2.67 Gy per fraction x 21 fractions.  Left breast boost/ 10 Gy at 2 Gy per fraction x 5 fractions  (5) history of chronic myeloid leukemia, continues on imatinib/Gleevec 400 mg daily  (a) bcr.abl / abl ratio 0.00013 as of 05/09/2015.  PLAN: Mayanna is doing well today.  Her labs are stable thus far and bcr/abl is pending.  She continues on [facility] Tuesday, Thursday, Saturday, Sunday without any difficulty.  She will continue this regimen.  She and her daughter verbalize that she will get her mammo scheduled. I recommended some xrays for her back pain that was + on exam.  She thinks its related to her blood pressure and declined xrays, however will call if she changes her mind. She will return in 3 months for labs and follow up with Dr. Jana Hakim.  I gave them my card, they know to call for any questions or concerns.     A total of (30) minutes of face-to-face time was spent with this patient  with greater than 50% of that time in counseling and care-coordination.   Scot Dock, NP   04/19/2017 2:52 PM

## 2017-04-21 ENCOUNTER — Other Ambulatory Visit: Payer: Self-pay | Admitting: Internal Medicine

## 2017-04-21 DIAGNOSIS — Z853 Personal history of malignant neoplasm of breast: Secondary | ICD-10-CM

## 2017-04-29 ENCOUNTER — Ambulatory Visit
Admission: RE | Admit: 2017-04-29 | Discharge: 2017-04-29 | Disposition: A | Discharge status: Home / Self Care | Payer: Medicare Other | Source: Ambulatory Visit | Attending: Internal Medicine | Admitting: Internal Medicine

## 2017-05-05 ENCOUNTER — Ambulatory Visit
Admission: RE | Admit: 2017-05-05 | Discharge: 2017-05-05 | Disposition: A | Discharge status: Home / Self Care | Payer: Medicare Other | Source: Ambulatory Visit | Attending: Internal Medicine | Admitting: Internal Medicine

## 2017-05-05 DIAGNOSIS — Z853 Personal history of malignant neoplasm of breast: Secondary | ICD-10-CM

## 2017-05-05 HISTORY — DX: Personal history of antineoplastic chemotherapy: Z92.21

## 2017-05-05 HISTORY — DX: Malignant neoplasm of unspecified site of unspecified female breast: C50.919

## 2017-05-05 HISTORY — DX: Personal history of irradiation: Z92.3

## 2017-05-31 LAB — BCR/ABL (LIO MMD)

## 2017-06-13 ENCOUNTER — Other Ambulatory Visit: Payer: Self-pay | Admitting: Oncology

## 2017-06-19 ENCOUNTER — Other Ambulatory Visit: Payer: Self-pay | Admitting: Oncology

## 2017-06-22 ENCOUNTER — Telehealth: Payer: Self-pay | Admitting: Oncology

## 2017-06-22 NOTE — Telephone Encounter (Signed)
R/s appt per 9/15 sch message - patient is aware of appt date and time.

## 2017-07-19 ENCOUNTER — Other Ambulatory Visit (HOSPITAL_BASED_OUTPATIENT_CLINIC_OR_DEPARTMENT_OTHER): Payer: Medicare Other

## 2017-07-19 ENCOUNTER — Telehealth: Payer: Self-pay | Admitting: Oncology

## 2017-07-19 ENCOUNTER — Ambulatory Visit (HOSPITAL_COMMUNITY)
Admission: RE | Admit: 2017-07-19 | Discharge: 2017-07-19 | Disposition: A | Discharge status: Home / Self Care | Payer: Medicare Other | Source: Ambulatory Visit | Attending: Adult Health | Admitting: Adult Health

## 2017-07-19 ENCOUNTER — Encounter: Payer: Self-pay | Admitting: Adult Health

## 2017-07-19 ENCOUNTER — Ambulatory Visit: Payer: Medicare Other | Admitting: Adult Health

## 2017-07-19 VITALS — BP 180/64 | HR 56 | Temp 97.7°F | Resp 16 | Ht 63.0 in | Wt 119.5 lb

## 2017-07-19 DIAGNOSIS — R05 Cough: Secondary | ICD-10-CM

## 2017-07-19 DIAGNOSIS — C50412 Malignant neoplasm of upper-outer quadrant of left female breast: Secondary | ICD-10-CM

## 2017-07-19 DIAGNOSIS — C9210 Chronic myeloid leukemia, BCR/ABL-positive, not having achieved remission: Secondary | ICD-10-CM

## 2017-07-19 DIAGNOSIS — Z171 Estrogen receptor negative status [ER-]: Principal | ICD-10-CM

## 2017-07-19 DIAGNOSIS — R059 Cough, unspecified: Secondary | ICD-10-CM

## 2017-07-19 LAB — CBC WITH DIFFERENTIAL/PLATELET
BASO%: 0.8 % (ref 0.0–2.0)
Basophils Absolute: 0 10*3/uL (ref 0.0–0.1)
EOS%: 11.6 % — AB (ref 0.0–7.0)
Eosinophils Absolute: 0.3 10*3/uL (ref 0.0–0.5)
HCT: 33.3 % — ABNORMAL LOW (ref 34.8–46.6)
HGB: 10.9 g/dL — ABNORMAL LOW (ref 11.6–15.9)
LYMPH#: 1.5 10*3/uL (ref 0.9–3.3)
LYMPH%: 56.8 % — AB (ref 14.0–49.7)
MCH: 30.7 pg (ref 25.1–34.0)
MCHC: 32.7 g/dL (ref 31.5–36.0)
MCV: 93.8 fL (ref 79.5–101.0)
MONO#: 0.2 10*3/uL (ref 0.1–0.9)
MONO%: 6.2 % (ref 0.0–14.0)
NEUT%: 24.6 % — ABNORMAL LOW (ref 38.4–76.8)
NEUTROS ABS: 0.6 10*3/uL — AB (ref 1.5–6.5)
PLATELETS: 171 10*3/uL (ref 145–400)
RBC: 3.55 10*6/uL — AB (ref 3.70–5.45)
RDW: 13.5 % (ref 11.2–14.5)
WBC: 2.6 10*3/uL — AB (ref 3.9–10.3)

## 2017-07-19 LAB — COMPREHENSIVE METABOLIC PANEL
ALBUMIN: 3.3 g/dL — AB (ref 3.5–5.0)
ALK PHOS: 77 U/L (ref 40–150)
ALT: 10 U/L (ref 0–55)
AST: 24 U/L (ref 5–34)
Anion Gap: 5 mEq/L (ref 3–11)
BUN: 14.7 mg/dL (ref 7.0–26.0)
CALCIUM: 9.1 mg/dL (ref 8.4–10.4)
CO2: 30 mEq/L — ABNORMAL HIGH (ref 22–29)
Chloride: 104 mEq/L (ref 98–109)
Creatinine: 0.9 mg/dL (ref 0.6–1.1)
Glucose: 82 mg/dl (ref 70–140)
POTASSIUM: 3.4 meq/L — AB (ref 3.5–5.1)
Sodium: 139 mEq/L (ref 136–145)
Total Bilirubin: 0.38 mg/dL (ref 0.20–1.20)
Total Protein: 6.3 g/dL — ABNORMAL LOW (ref 6.4–8.3)

## 2017-07-19 NOTE — Telephone Encounter (Signed)
Gave patient AVS and calendar of upcoming February appointments.

## 2017-07-19 NOTE — Progress Notes (Signed)
[place]  Telephone:(336) [phone] Fax:(336) [phone]     ID: Pamela House DOB: 08-30-1930  MR#: [id number]  DJS#:[id number]  Patient Care Team: Pamela Low, MD as PCP - General (Internal Medicine) House, Pamela Dad, MD as Consulting Physician (Oncology) Pamela Kussmaul, MD as Consulting Physician (General Surgery) Pamela Dresser, MD as Consulting Physician (Cardiology) PCP: Pamela Low, MD GYN: OTHER MD:  CHIEF COMPLAINT: b estrogen receptor negative breast cancer ; chronic myeloid leukemia  CURRENT TREATMENT:  Gleevec Tuesday, Thursday, Saturday, and Sunday  BREAST CANCER HISTORY: From the original intake note:  [name] has a history of chronic myeloid leukemia followed by Dr. Earlie House. She is treated with imatinib and her most recent BCR.ABL/ABL ratio was nearly unmeasurable.   On 12/25/2014 the patient had routine bilateral screening mammography at the breast Center, showing a breast density category B. There was a possible mass in the left breast and the patient was recalled for left diagnostic mammography with tomosynthesis and left breast ultrasonography 01/02/2015. This showed an 8 mm mass with indistinct margins in the posterior third of the outer left breast, which was palpable at the 2:30 position 8 cm from the nipple. Ultrasound confirmed an oval solid mass measuring 8 mm. Ultrasound of the left axilla showed no abnormal lymphadenopathy.  Biopsy of the left breast mass in question 01/23/2015 showed ([id number]) an invasive ductal carcinoma, grade 3, estrogen and progesterone receptor negative, but with HER-2 amplification, the signals ratio being 2.38, and the number per cell 3.45. MIB-1 was 79%.  The patient's case was presented at the multidisciplinary breast cancer conference 01/29/2015. At that time it was felt that breast conserving surgery would be the first step, to be followed by adjuvant chemotherapy and anti-HER-2  immunotherapy.  The patient's subsequent history is as detailed below  INTERVAL HISTORY:  Pamela House returns today for follow-up her h/o breast cancer and CML.  She is accompanied by her daughter Pamela House today.  She is taking Gleevec Tuesday, Thursday, Saturday, and Sundays.  She is tolerating this well.    REVIEW OF SYSTEMS:   Pamela House has a cough, she noted worsening of her cough over the past couple of weeks.  She denies fevers, chills, DOE, weight loss.  A detailed ROS is non contributory.    PAST MEDICAL HISTORY: Past Medical History:  Diagnosis Date  . Allergic rhinitis   . Anxiety   . Breast cancer ([place]) 2016   left breast  . DDD (degenerative disc disease)   . Dementia   . Dyslipidemia   . GERD (gastroesophageal reflux disease)   . Hypertension   . Leukemia ([place]) 2010  . Osteoporosis   . Personal history of chemotherapy 2016  . Personal history of radiation therapy 2016  . Radiation 04/30/15-05/1515   Left breast  . Vitamin D deficiency     PAST SURGICAL HISTORY: Past Surgical History:  Procedure Laterality Date  . BACK SURGERY  1984  . BREAST LUMPECTOMY Left 2016    FAMILY HISTORY No family history on file. The patient's father died at the age of 56. The patient's mother died in her 74J from complications of diabetes. The patient had one full brother and one full sister there this fall sister had ovarian cancer diagnosed late in life. The patient had 14 additional half siblings. There is no other history of breast cancer in the family.  GYNECOLOGIC HISTORY:  No LMP recorded. Patient has had a hysterectomy. Menarche age 89, first live birth age 46.  She is GX P5. [name] is not sure when she went through the change of life, but she had a hysterectomy remotely. She is not sure whether the ovaries were removed. She did not take hormone replacement.  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 Her husband died in 11-22-99 and  she lives by herself, with no pets, in the [employer] school apartments off [address]. There are exercise groups there but not a gym. Her daughter Pamela House is Agricultural consultant for a shipping company in [place]. Granddaughter Pamela House (pronounced "Pamela House") is a Education officer, museum for the child protective services in [place]. The patient has 2 sons who have died. She attends a [employer].    ADVANCED DIRECTIVES: Her daughter Pamela House is her healthcare power of attorney. She can be reached at [phone].   HEALTH MAINTENANCE: Social History   Tobacco Use  . Smoking status: Never Smoker  . Smokeless tobacco: Never Used  Substance Use Topics  . Alcohol use: No    Alcohol/week: 0.0 oz  . Drug use: No     Colonoscopy:  PAP:  Bone density:  Lipid panel:  Allergies  Allergen Reactions  . Ace Inhibitors Cough  . Hyzaar [Losartan Potassium-Hctz] Cough  . Lipitor [Atorvastatin]     Muscle fatigue  . Losartan Cough  . Tiazac [Diltiazem Hcl Er Beads] Cough    Current Outpatient Medications  Medication Sig Dispense Refill  . albuterol (PROAIR HFA) 108 (90 Base) MCG/ACT inhaler Inhale 2 puffs into the lungs every 6 (six) hours as needed for wheezing or shortness of breath. 8 g 3  . ALPRAZolam (XANAX) 0.5 MG tablet Take 0.5 mg by mouth 2 (two) times daily as needed for anxiety.     Marland Kitchen aspirin 81 MG tablet Take 81 mg by mouth daily.    . carvedilol (COREG) 12.5 MG tablet TAKE 1 AND 1/2 TABLETS BY MOUTH TWICE DAILY 90 tablet 11  . donepezil (ARICEPT) 5 MG tablet Take 5 mg by mouth at bedtime.    . fluticasone (FLONASE) 50 MCG/ACT nasal spray Place 1 spray into both nostrils daily as needed for allergies or rhinitis. Reported on 09/09/2015    . furosemide (LASIX) 20 MG tablet Take 20 mg by mouth as needed.    . imatinib (GLEEVEC) 400 MG tablet Take 1 tablet by mouth once daily with a large glass of water. 30 tablet 3  . irbesartan (AVAPRO) 300 MG tablet   1  . mirtazapine  (REMERON) 15 MG tablet   1  . omeprazole (PRILOSEC) 20 MG capsule take 1 capsule by mouth twice a day before meals 60 capsule 4  . potassium chloride (K-DUR,KLOR-CON) 10 MEQ tablet Take 10 mEq by mouth as needed (with lasix.).    Marland Kitchen Probiotic Product (ALIGN PO) Take by mouth.    . Vitamin D, Ergocalciferol, (DRISDOL) 50000 units CAPS capsule Take 50,000 Units by mouth every 30 (thirty) days.     No current facility-administered medications for this visit.     OBJECTIVE:   Vitals:   07/19/17 1442  BP: (!) 180/64  Pulse: (!) 56  Resp: 16  Temp: 97.7 F (36.5 C)  SpO2: 97%     Body mass index is 21.17 kg/m.    ECOG FS:1 - Symptomatic but completely ambulatory GENERAL: Patient is a well appearing female in no acute distress HEENT:  Sclerae anicteric.  Oropharynx clear and moist. No ulcerations or evidence  of oropharyngeal candidiasis. Neck is supple.  NODES:  No cervical, supraclavicular, or axillary lymphadenopathy palpated.  BREAST EXAM:  Deferred today LUNGS:  Crackles to right lower lobe.  No wheezes or rhonchi. HEART:  Regular rate and rhythm. No murmur appreciated. ABDOMEN:  Soft, nontender.  Positive, normoactive bowel sounds. No organomegaly palpated. MSK:   Full range of motion bilaterally in the upper extremities. EXTREMITIES:  No peripheral edema.   SKIN:  Clear with no obvious rashes or skin changes. No nail dyscrasia. NEURO:  Nonfocal. Well oriented.  Appropriate affect.    LAB RESULTS:  CMP     Component Value Date/Time   NA 139 07/19/2017 1337   K 3.4 (L) 07/19/2017 1337   CL 103 03/23/2016 1259   CL 106 02/21/2013 1355   CO2 30 (H) 07/19/2017 1337   GLUCOSE 82 07/19/2017 1337   GLUCOSE 92 02/21/2013 1355   BUN 14.7 07/19/2017 1337   CREATININE 0.9 07/19/2017 1337   CALCIUM 9.1 07/19/2017 1337   PROT 6.3 (L) 07/19/2017 1337   ALBUMIN 3.3 (L) 07/19/2017 1337   AST 24 07/19/2017 1337   ALT 10 07/19/2017 1337   ALKPHOS 77 07/19/2017 1337   BILITOT 0.38  07/19/2017 1337   GFRNONAA >60 03/23/2016 1259   GFRAA >60 03/23/2016 1259    INo results found for: SPEP, UPEP  Lab Results  Component Value Date   WBC 2.6 (L) 07/19/2017   NEUTROABS 0.6 (L) 07/19/2017   HGB 10.9 (L) 07/19/2017   HCT 33.3 (L) 07/19/2017   MCV 93.8 07/19/2017   PLT 171 07/19/2017      Chemistry      Component Value Date/Time   NA 139 07/19/2017 1337   K 3.4 (L) 07/19/2017 1337   CL 103 03/23/2016 1259   CL 106 02/21/2013 1355   CO2 30 (H) 07/19/2017 1337   BUN 14.7 07/19/2017 1337   CREATININE 0.9 07/19/2017 1337      Component Value Date/Time   CALCIUM 9.1 07/19/2017 1337   ALKPHOS 77 07/19/2017 1337   AST 24 07/19/2017 1337   ALT 10 07/19/2017 1337   BILITOT 0.38 07/19/2017 1337       No results found for: LABCA2  No components found for: LABCA125  No results for input(s): INR in the last 168 hours.  Urinalysis    Component Value Date/Time   COLORURINE YELLOW 06/12/2014 2231   APPEARANCEUR CLEAR 06/12/2014 2231   LABSPEC 1.010 10/21/2015 1135   PHURINE 5.0 10/21/2015 1135   PHURINE 6.5 06/12/2014 2231   GLUCOSEU Negative 10/21/2015 1135   HGBUR Negative 10/21/2015 1135   HGBUR NEGATIVE 06/12/2014 2231   BILIRUBINUR Negative 10/21/2015 1135   KETONESUR Negative 10/21/2015 1135   KETONESUR NEGATIVE 06/12/2014 2231   PROTEINUR Negative 10/21/2015 1135   PROTEINUR NEGATIVE 06/12/2014 2231   UROBILINOGEN 0.2 10/21/2015 1135   NITRITE Negative 10/21/2015 1135   NITRITE NEGATIVE 06/12/2014 2231   LEUKOCYTESUR Negative 10/21/2015 1135    STUDIES: Repeat mammography due July 2018.  ASSESSMENT: 83 y.o. [place] woman status post left breast Upper outer quadrant biopsy 01/23/2015 for a clinical T1b N0, stage IA invasive ductal carcinoma, grade 3, estrogen and progesterone receptor negative, but HER-2 amplified, with an MIB-1 of 79%  (1) genetics testing requested--patient with breast cancer, family history of ovarian cancer  (2) left  lumpectomy with sentinel lymph node biopsy on 02/27/2015  (3) adjuvant anti-HER-2 immunotherapy with trastuzumab every 3 weeks through 1 year, started 03/28/15. Patient declined chemotherapy (abraxane).  (  a) echocardiogram 09/29/2015 showed an ejection fraction of 45-50%, with diffuse hypokinesis. Herceptin interrupted after 09/09/2015 dose  (b) echocardiogram of 03/23/2016 shows an ejection fraction of 60-65%.  (4) radiation 04/30/15-05/29/15: Left breast/ 42.72 Gy at 2.67 Gy per fraction x 21 fractions.  Left breast boost/ 10 Gy at 2 Gy per fraction x 5 fractions  (5) history of chronic myeloid leukemia, continues on imatinib/Gleevec 400 mg daily  (a) bcr.abl / abl ratio 0.00013 as of 05/09/2015.  (B) bcr.abl not detected on August, 2018  PLAN: Chelsye is doing well today.  Her labs are stable thus far and bcr/abl last level was not detected.  She is slightly neutropenic today with ANC of 0.6.  I reviewed this with Dr. Jana Hakim.  We will see if we can add on a blood film and change the med. To Monday/Wed/Friday.  We will also get a chest xray to eval her cough.    Chakira will return in about 3 months for labs and f/u.  She and her daughter verbalize understanding of the plan.  They know to call for any questions or concerns prior to her next appointment with [place].  A total of (30) minutes of face-to-face time was spent with this patient with greater than 50% of that time in counseling and care-coordination.   Scot Dock, NP   07/19/2017 3:01 PM

## 2017-07-20 ENCOUNTER — Other Ambulatory Visit: Payer: Self-pay | Admitting: Adult Health

## 2017-07-20 ENCOUNTER — Other Ambulatory Visit: Payer: Medicare Other

## 2017-07-20 ENCOUNTER — Telehealth: Payer: Self-pay

## 2017-07-20 ENCOUNTER — Telehealth: Payer: Self-pay | Admitting: Adult Health

## 2017-07-20 ENCOUNTER — Ambulatory Visit: Payer: Medicare Other | Admitting: Oncology

## 2017-07-20 MED ORDER — BENZONATATE 100 MG PO CAPS: 100.0000 mg | ORAL_CAPSULE | Freq: Three times a day (TID) | ORAL | 0 refills | Status: DC | PRN

## 2017-07-20 NOTE — Telephone Encounter (Signed)
-----  Message from Gardenia Phlegm, NP sent at 07/20/2017 12:14 PM EST ----- Please let patient know xray doesn't show anything new such as pneumonia, or any other reason for her cough.  Can you please call her and let her know (I would call her daughter pearl)  And then send in Tessalon Perrles that they requested 171m po tid prn.  Thanks,  LMendel Ryder----- Message ----- From: IBuel Ream Rad Results In Sent: 07/20/2017   7:55 AM To: LGardenia Phlegm NP

## 2017-07-20 NOTE — Telephone Encounter (Signed)
LPN spoke with patients daughter [place] about results of chest xray.  Ok per daughter to send Rx in for [employer] for pt at [facility] on [address].

## 2017-08-01 LAB — BCR/ABL (LIO MMD)

## 2017-08-03 ENCOUNTER — Other Ambulatory Visit: Payer: Self-pay | Admitting: Adult Health

## 2017-08-03 ENCOUNTER — Telehealth: Payer: Self-pay | Admitting: *Deleted

## 2017-08-03 ENCOUNTER — Other Ambulatory Visit: Payer: Self-pay | Admitting: Oncology

## 2017-08-03 NOTE — Telephone Encounter (Signed)
Ok to refill 

## 2017-08-03 NOTE — Telephone Encounter (Signed)
"  I would like a refill for Benzonatate 100 mg.  Send refill to [facility] on [address] [phone].  It helps my cough.  Temperature early this morning = 98.4.  Occasionally I take aspirin 81 mg if I have a fever.  Cough since diagnosed with cancer.  No other symptoms.  Missed a lot of appointments due to diarrhea and that's still a problem I can't control .  Can't help missing appointments with my bowels.  Return call to my home number [phone]."

## 2017-08-03 NOTE — Telephone Encounter (Signed)
Notified patient of refill sent today to [name] as requested.  Denies further needs at this time.

## 2017-08-08 ENCOUNTER — Other Ambulatory Visit: Payer: Self-pay | Admitting: Oncology

## 2017-08-09 ENCOUNTER — Other Ambulatory Visit: Payer: Self-pay | Admitting: Oncology
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
## 2017-10-19 ENCOUNTER — Inpatient Hospital Stay: Payer: Medicare Other | Attending: Oncology

## 2017-10-19 ENCOUNTER — Telehealth: Payer: Self-pay | Admitting: Oncology

## 2017-10-19 ENCOUNTER — Inpatient Hospital Stay: Payer: Medicare Other | Admitting: Oncology

## 2017-10-19 VITALS — BP 108/54 | HR 58 | Temp 98.2°F | Resp 18 | Ht 63.0 in | Wt 109.7 lb

## 2017-10-19 DIAGNOSIS — M810 Age-related osteoporosis without current pathological fracture: Secondary | ICD-10-CM

## 2017-10-19 DIAGNOSIS — Z853 Personal history of malignant neoplasm of breast: Secondary | ICD-10-CM

## 2017-10-19 DIAGNOSIS — Z90710 Acquired absence of both cervix and uterus: Secondary | ICD-10-CM | POA: Insufficient documentation

## 2017-10-19 DIAGNOSIS — Z7982 Long term (current) use of aspirin: Secondary | ICD-10-CM | POA: Insufficient documentation

## 2017-10-19 DIAGNOSIS — C9210 Chronic myeloid leukemia, BCR/ABL-positive, not having achieved remission: Secondary | ICD-10-CM | POA: Diagnosis not present

## 2017-10-19 DIAGNOSIS — Z8041 Family history of malignant neoplasm of ovary: Secondary | ICD-10-CM | POA: Insufficient documentation

## 2017-10-19 DIAGNOSIS — Z79899 Other long term (current) drug therapy: Secondary | ICD-10-CM | POA: Insufficient documentation

## 2017-10-19 DIAGNOSIS — F0390 Unspecified dementia without behavioral disturbance: Secondary | ICD-10-CM

## 2017-10-19 DIAGNOSIS — R05 Cough: Secondary | ICD-10-CM | POA: Diagnosis not present

## 2017-10-19 DIAGNOSIS — Z171 Estrogen receptor negative status [ER-]: Secondary | ICD-10-CM

## 2017-10-19 DIAGNOSIS — T451X5A Adverse effect of antineoplastic and immunosuppressive drugs, initial encounter: Secondary | ICD-10-CM

## 2017-10-19 DIAGNOSIS — J849 Interstitial pulmonary disease, unspecified: Secondary | ICD-10-CM

## 2017-10-19 DIAGNOSIS — Z923 Personal history of irradiation: Secondary | ICD-10-CM

## 2017-10-19 DIAGNOSIS — I427 Cardiomyopathy due to drug and external agent: Secondary | ICD-10-CM

## 2017-10-19 DIAGNOSIS — C50412 Malignant neoplasm of upper-outer quadrant of left female breast: Secondary | ICD-10-CM

## 2017-10-19 DIAGNOSIS — E785 Hyperlipidemia, unspecified: Secondary | ICD-10-CM

## 2017-10-19 DIAGNOSIS — I10 Essential (primary) hypertension: Secondary | ICD-10-CM

## 2017-10-19 DIAGNOSIS — K219 Gastro-esophageal reflux disease without esophagitis: Secondary | ICD-10-CM | POA: Diagnosis not present

## 2017-10-19 LAB — CBC WITH DIFFERENTIAL/PLATELET
BASOS ABS: 0 10*3/uL (ref 0.0–0.1)
BASOS PCT: 1 %
Eosinophils Absolute: 0.3 10*3/uL (ref 0.0–0.5)
Eosinophils Relative: 12 %
HCT: 30.8 % — ABNORMAL LOW (ref 34.8–46.6)
Hemoglobin: 10.2 g/dL — ABNORMAL LOW (ref 11.6–15.9)
LYMPHS PCT: 49 %
Lymphs Abs: 1.2 10*3/uL (ref 0.9–3.3)
MCH: 31.7 pg (ref 25.1–34.0)
MCHC: 33 g/dL (ref 31.5–36.0)
MCV: 96.1 fL (ref 79.5–101.0)
Monocytes Absolute: 0.2 10*3/uL (ref 0.1–0.9)
Monocytes Relative: 10 %
NEUTROS ABS: 0.7 10*3/uL — AB (ref 1.5–6.5)
Neutrophils Relative %: 28 %
PLATELETS: 237 10*3/uL (ref 145–400)
RBC: 3.21 MIL/uL — AB (ref 3.70–5.45)
RDW: 14.8 % — ABNORMAL HIGH (ref 11.2–14.5)
WBC: 2.4 10*3/uL — AB (ref 3.9–10.3)

## 2017-10-19 LAB — COMPREHENSIVE METABOLIC PANEL
ALBUMIN: 3.6 g/dL (ref 3.5–5.0)
ALT: 9 U/L (ref 0–55)
AST: 19 U/L (ref 5–34)
Alkaline Phosphatase: 99 U/L (ref 40–150)
Anion gap: 8 (ref 3–11)
BUN: 17 mg/dL (ref 7–26)
CO2: 27 mmol/L (ref 22–29)
CREATININE: 1.06 mg/dL (ref 0.60–1.10)
Calcium: 9.6 mg/dL (ref 8.4–10.4)
Chloride: 102 mmol/L (ref 98–109)
GFR calc Af Amer: 54 mL/min — ABNORMAL LOW (ref >60)
GFR, EST NON AFRICAN AMERICAN: 47 mL/min — AB (ref >60)
GLUCOSE: 85 mg/dL (ref 70–140)
POTASSIUM: 4 mmol/L (ref 3.5–5.1)
Sodium: 137 mmol/L (ref 136–145)
Total Bilirubin: 0.3 mg/dL (ref 0.2–1.2)
Total Protein: 6.6 g/dL (ref 6.4–8.3)

## 2017-10-19 MED ORDER — POTASSIUM CHLORIDE CRYS ER 10 MEQ PO TBCR: 10.0000 meq | EXTENDED_RELEASE_TABLET | ORAL | 4 refills | Status: DC | PRN

## 2017-10-19 MED ORDER — OMEPRAZOLE 40 MG PO CPDR: 40.0000 mg | DELAYED_RELEASE_CAPSULE | Freq: Every day | ORAL | 4 refills | Status: DC

## 2017-10-19 MED ORDER — CARVEDILOL 25 MG PO TABS
25.0000 mg | ORAL_TABLET | Freq: Two times a day (BID) | ORAL | 4 refills | Status: AC
Start: 2017-10-19 — End: ?

## 2017-10-19 MED ORDER — BENZONATATE 100 MG PO CAPS: 100.0000 mg | ORAL_CAPSULE | Freq: Three times a day (TID) | ORAL | 0 refills | Status: DC | PRN

## 2017-10-19 MED ORDER — IMATINIB MESYLATE 400 MG PO TABS: ORAL_TABLET | ORAL | 3 refills | Status: DC

## 2017-10-19 NOTE — Telephone Encounter (Signed)
Gave patient avs and calendar with appts per 2/6 los.

## 2017-10-28 ENCOUNTER — Other Ambulatory Visit: Payer: Self-pay

## 2017-10-28 MED ORDER — OMEPRAZOLE 20 MG PO CPDR: 20.0000 mg | DELAYED_RELEASE_CAPSULE | Freq: Two times a day (BID) | ORAL | 4 refills | Status: DC

## 2017-10-28 NOTE — Telephone Encounter (Signed)
Received call from pt regarding what days to take her prescribed Imatinib, per Dr [name] 10/19/2017 notes pt should be taking "Tuesday, Thursday, Saturday, and Sunday"  Pt voiced understanding. She also would like her Omeprazole prescription changed to 20 mg twice a day as previously discussed, not 40 mg once a day. Per Dr Jana Hakim notes, prescription is for Omeprazole is for 20 mg twice a day, refill completed at this time. No other needs per pt at this time.

## 2017-10-31 ENCOUNTER — Telehealth: Payer: Self-pay | Admitting: *Deleted

## 2017-10-31 NOTE — Telephone Encounter (Signed)
This RN spoke with pt today per need to change Gleevec dose per results of molecular test results  - which were not available per last phone discussion.  Per MD - pt needs to take Gleevec daily.  Pamela House verbalized understanding.  No further needs at this time.

## 2017-12-21 ENCOUNTER — Telehealth: Payer: Self-pay | Admitting: *Deleted

## 2017-12-21 NOTE — Telephone Encounter (Signed)
"  I have O-M-E-P-R-A-Z-O-L-E 40 mg once a day that should be 20 mg twice a day.  40 mg is too strong for my stomach.  I know what I should have but someone else picked up my medication from the pharamacy."  Confirmed [facility] number with patient.  [facility] eRx order documenatation matches patient request.  "I will call [facility] to clear things up."

## 2018-02-20 ENCOUNTER — Other Ambulatory Visit: Payer: Self-pay

## 2018-02-20 DIAGNOSIS — R5381 Other malaise: Secondary | ICD-10-CM

## 2018-02-22 ENCOUNTER — Other Ambulatory Visit: Payer: Self-pay | Admitting: Oncology

## 2018-02-22 DIAGNOSIS — C9210 Chronic myeloid leukemia, BCR/ABL-positive, not having achieved remission: Secondary | ICD-10-CM

## 2018-03-23 ENCOUNTER — Other Ambulatory Visit: Payer: Self-pay | Admitting: Oncology

## 2018-03-23 DIAGNOSIS — C9210 Chronic myeloid leukemia, BCR/ABL-positive, not having achieved remission: Secondary | ICD-10-CM

## 2018-04-19 ENCOUNTER — Other Ambulatory Visit: Payer: Self-pay | Admitting: Oncology

## 2018-04-19 DIAGNOSIS — C9210 Chronic myeloid leukemia, BCR/ABL-positive, not having achieved remission: Secondary | ICD-10-CM

## 2018-04-21 ENCOUNTER — Other Ambulatory Visit: Payer: Self-pay | Admitting: Oncology

## 2018-05-16 ENCOUNTER — Other Ambulatory Visit: Payer: Self-pay | Admitting: Oncology

## 2018-05-16 DIAGNOSIS — C9210 Chronic myeloid leukemia, BCR/ABL-positive, not having achieved remission: Secondary | ICD-10-CM

## 2018-06-07 ENCOUNTER — Other Ambulatory Visit: Payer: Self-pay | Admitting: Oncology

## 2018-06-07 DIAGNOSIS — C9210 Chronic myeloid leukemia, BCR/ABL-positive, not having achieved remission: Secondary | ICD-10-CM

## 2018-06-19 ENCOUNTER — Other Ambulatory Visit: Payer: Self-pay | Admitting: *Deleted

## 2018-06-19 DIAGNOSIS — C9210 Chronic myeloid leukemia, BCR/ABL-positive, not having achieved remission: Secondary | ICD-10-CM

## 2018-06-19 DIAGNOSIS — Z171 Estrogen receptor negative status [ER-]: Secondary | ICD-10-CM

## 2018-06-19 DIAGNOSIS — C50412 Malignant neoplasm of upper-outer quadrant of left female breast: Secondary | ICD-10-CM

## 2018-06-19 NOTE — Progress Notes (Signed)
No show

## 2018-06-20 ENCOUNTER — Inpatient Hospital Stay: Payer: Medicare Other | Admitting: Oncology

## 2018-06-20 ENCOUNTER — Encounter: Payer: Self-pay | Admitting: Oncology

## 2018-06-20 ENCOUNTER — Inpatient Hospital Stay: Payer: Medicare Other

## 2018-06-21 ENCOUNTER — Telehealth: Payer: Self-pay | Admitting: Oncology

## 2018-06-21 NOTE — Progress Notes (Signed)
No show

## 2018-06-21 NOTE — Telephone Encounter (Signed)
Returned call to patient to r/s her missed appt per 10/9 phone message

## 2018-06-22 ENCOUNTER — Other Ambulatory Visit: Payer: Medicare Other

## 2018-06-22 ENCOUNTER — Other Ambulatory Visit: Payer: Self-pay | Admitting: Oncology

## 2018-06-22 ENCOUNTER — Telehealth: Payer: Self-pay | Admitting: General Practice

## 2018-06-22 ENCOUNTER — Ambulatory Visit: Payer: Medicare Other | Admitting: Oncology
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
## 2018-06-22 NOTE — Telephone Encounter (Signed)
[place] CSW Progress Notes  Request received from MD to follow up w patient due to two no shows.  Today states she just woke up and realized she missed today's appointment.  Missed last appointment because "I was just about really to go out the door and my bowels went out."  Is drinking Boost, has difficulty eating, "things wont stay on my stomach, it just comes back up."  "I have been sick for the past two days, I just woke up and I don't feel good at all."  Reports "I am getting weaker and my body is hurting" for "past year."  Is having difficulty w cough, nausea/vominting, loss of bowel control, generalized weakness.  PCP (Dr Lorenda Hatchet) says patient's osteoporosis may be causing mobility difficulties. Lives by herself, family members work and do not look in on patient very often.  Very isolated at home.  Has family member who can take her to the MD.  CSW will ask RN to call patient and reschedule appt.  Will need to consider home health referral.  Patient does not have Medicaid at this point.  CSW will discuss options w MD and RN, patient would like RN to call her w rescheduled appt.  Edwyna Shell, LCSW Clinical Social Worker Phone:  [phone]

## 2018-06-23 ENCOUNTER — Telehealth: Payer: Self-pay | Admitting: Oncology

## 2018-06-23 NOTE — Telephone Encounter (Signed)
Scheduled appt per 10/11 sch message - pt is aware of appt date and time

## 2018-06-26 ENCOUNTER — Encounter: Payer: Self-pay | Admitting: Adult Health

## 2018-06-26 ENCOUNTER — Encounter: Payer: Self-pay | Admitting: *Deleted

## 2018-06-26 ENCOUNTER — Ambulatory Visit (HOSPITAL_COMMUNITY)
Admission: RE | Admit: 2018-06-26 | Discharge: 2018-06-26 | Disposition: A | Discharge status: Home / Self Care | Payer: Medicare Other | Source: Ambulatory Visit | Attending: Adult Health | Admitting: Adult Health

## 2018-06-26 ENCOUNTER — Telehealth: Payer: Self-pay | Admitting: Adult Health

## 2018-06-26 ENCOUNTER — Inpatient Hospital Stay: Payer: Medicare Other | Attending: Adult Health | Admitting: Adult Health

## 2018-06-26 ENCOUNTER — Inpatient Hospital Stay: Payer: Medicare Other

## 2018-06-26 VITALS — BP 93/36 | HR 54 | Temp 97.8°F | Resp 16 | Ht 63.0 in | Wt 97.1 lb

## 2018-06-26 DIAGNOSIS — Z9221 Personal history of antineoplastic chemotherapy: Secondary | ICD-10-CM | POA: Diagnosis not present

## 2018-06-26 DIAGNOSIS — R630 Anorexia: Secondary | ICD-10-CM | POA: Diagnosis not present

## 2018-06-26 DIAGNOSIS — Z79899 Other long term (current) drug therapy: Secondary | ICD-10-CM | POA: Diagnosis not present

## 2018-06-26 DIAGNOSIS — Z171 Estrogen receptor negative status [ER-]: Secondary | ICD-10-CM | POA: Insufficient documentation

## 2018-06-26 DIAGNOSIS — Z90710 Acquired absence of both cervix and uterus: Secondary | ICD-10-CM | POA: Insufficient documentation

## 2018-06-26 DIAGNOSIS — Z7982 Long term (current) use of aspirin: Secondary | ICD-10-CM

## 2018-06-26 DIAGNOSIS — R634 Abnormal weight loss: Secondary | ICD-10-CM | POA: Insufficient documentation

## 2018-06-26 DIAGNOSIS — R1110 Vomiting, unspecified: Secondary | ICD-10-CM | POA: Diagnosis not present

## 2018-06-26 DIAGNOSIS — I10 Essential (primary) hypertension: Secondary | ICD-10-CM | POA: Diagnosis not present

## 2018-06-26 DIAGNOSIS — K219 Gastro-esophageal reflux disease without esophagitis: Secondary | ICD-10-CM | POA: Insufficient documentation

## 2018-06-26 DIAGNOSIS — E860 Dehydration: Secondary | ICD-10-CM | POA: Diagnosis not present

## 2018-06-26 DIAGNOSIS — R42 Dizziness and giddiness: Secondary | ICD-10-CM | POA: Diagnosis not present

## 2018-06-26 DIAGNOSIS — Z853 Personal history of malignant neoplasm of breast: Secondary | ICD-10-CM | POA: Insufficient documentation

## 2018-06-26 DIAGNOSIS — R05 Cough: Secondary | ICD-10-CM | POA: Diagnosis not present

## 2018-06-26 DIAGNOSIS — Z8041 Family history of malignant neoplasm of ovary: Secondary | ICD-10-CM | POA: Insufficient documentation

## 2018-06-26 DIAGNOSIS — J8410 Pulmonary fibrosis, unspecified: Secondary | ICD-10-CM | POA: Diagnosis not present

## 2018-06-26 DIAGNOSIS — R531 Weakness: Secondary | ICD-10-CM | POA: Diagnosis not present

## 2018-06-26 DIAGNOSIS — E785 Hyperlipidemia, unspecified: Secondary | ICD-10-CM | POA: Insufficient documentation

## 2018-06-26 DIAGNOSIS — C9210 Chronic myeloid leukemia, BCR/ABL-positive, not having achieved remission: Secondary | ICD-10-CM | POA: Diagnosis not present

## 2018-06-26 DIAGNOSIS — Z923 Personal history of irradiation: Secondary | ICD-10-CM | POA: Insufficient documentation

## 2018-06-26 DIAGNOSIS — R0602 Shortness of breath: Secondary | ICD-10-CM | POA: Diagnosis not present

## 2018-06-26 DIAGNOSIS — C50412 Malignant neoplasm of upper-outer quadrant of left female breast: Secondary | ICD-10-CM

## 2018-06-26 DIAGNOSIS — F0390 Unspecified dementia without behavioral disturbance: Secondary | ICD-10-CM | POA: Insufficient documentation

## 2018-06-26 LAB — CMP (CANCER CENTER ONLY)
ALBUMIN: 3.4 g/dL — AB (ref 3.5–5.0)
ALT: 10 U/L (ref 0–44)
ANION GAP: 8 (ref 5–15)
AST: 20 U/L (ref 15–41)
Alkaline Phosphatase: 64 U/L (ref 38–126)
BILIRUBIN TOTAL: 0.4 mg/dL (ref 0.3–1.2)
BUN: 11 mg/dL (ref 8–23)
CHLORIDE: 99 mmol/L (ref 98–111)
CO2: 27 mmol/L (ref 22–32)
Calcium: 9.2 mg/dL (ref 8.9–10.3)
Creatinine: 1.21 mg/dL — ABNORMAL HIGH (ref 0.44–1.00)
GFR, Est AFR Am: 46 mL/min — ABNORMAL LOW (ref >60)
GFR, Estimated: 39 mL/min — ABNORMAL LOW (ref >60)
GLUCOSE: 106 mg/dL — AB (ref 70–99)
POTASSIUM: 3.9 mmol/L (ref 3.5–5.1)
SODIUM: 134 mmol/L — AB (ref 135–145)
TOTAL PROTEIN: 6.4 g/dL — AB (ref 6.5–8.1)

## 2018-06-26 LAB — CBC WITH DIFFERENTIAL (CANCER CENTER ONLY)
Abs Immature Granulocytes: 0 10*3/uL (ref 0.00–0.07)
BASOS ABS: 0 10*3/uL (ref 0.0–0.1)
BASOS PCT: 1 %
EOS PCT: 6 %
Eosinophils Absolute: 0.2 10*3/uL (ref 0.0–0.5)
HCT: 25.6 % — ABNORMAL LOW (ref 36.0–46.0)
Hemoglobin: 8.4 g/dL — ABNORMAL LOW (ref 12.0–15.0)
Immature Granulocytes: 0 %
LYMPHS PCT: 30 %
Lymphs Abs: 1.1 10*3/uL (ref 0.7–4.0)
MCH: 32.3 pg (ref 26.0–34.0)
MCHC: 32.8 g/dL (ref 30.0–36.0)
MCV: 98.5 fL (ref 80.0–100.0)
Monocytes Absolute: 0.3 10*3/uL (ref 0.1–1.0)
Monocytes Relative: 9 %
Neutro Abs: 2.1 10*3/uL (ref 1.7–7.7)
Neutrophils Relative %: 54 %
PLATELETS: 220 10*3/uL (ref 150–400)
RBC: 2.6 MIL/uL — AB (ref 3.87–5.11)
RDW: 13.5 % (ref 11.5–15.5)
WBC: 3.9 10*3/uL — AB (ref 4.0–10.5)
nRBC: 0 % (ref 0.0–0.2)

## 2018-06-26 MED ORDER — SODIUM CHLORIDE 0.9 % IV SOLN
INTRAVENOUS | Status: DC
  Administered 2018-06-26: 15:00:00 via INTRAVENOUS
  Filled 2018-06-26: qty 250

## 2018-06-26 NOTE — Progress Notes (Signed)
Pt here today for IV fluids per Dr.s orders, IV 22 gauge placed in R forearm. Fluids started. Abigail from Social work came to speak with Pt. Regarding her situation. Pt. Given Nutrition  to take home. And informed someone will contact her.

## 2018-06-26 NOTE — Progress Notes (Signed)
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 Troxler has a history of chronic myeloid leukemia followed by Dr. Earlie Server. She is treated with imatinib and her most recent BCR.ABL/ABL ratio was nearly unmeasurable.   On 12/25/2014 the patient had routine bilateral screening mammography at the breast Center, showing a breast density category B. There was a possible mass in the left breast and the patient was recalled for left diagnostic mammography with tomosynthesis and left breast ultrasonography 01/02/2015. This showed an 8 mm mass with indistinct margins in the posterior third of the outer left breast, which was palpable at the 2:30 position 8 cm from the nipple. Ultrasound confirmed an oval solid mass measuring 8 mm. Ultrasound of the left axilla showed no abnormal lymphadenopathy.  Biopsy of the left breast mass in question 01/23/2015 showed ([id number]) an invasive ductal carcinoma, grade 3, estrogen and progesterone receptor negative, but with HER-2 amplification, the signals ratio being 2.38, and the number per cell 3.45. MIB-1 was 79%.  The patient's case was presented at the multidisciplinary breast cancer conference 01/29/2015. At that time it was felt that breast conserving surgery would be the first step, to be followed by adjuvant chemotherapy and anti-HER-2 immunotherapy.  The  patient's subsequent history is as detailed below  INTERVAL HISTORY:  Peytyn returns today for follow up and treatment of her HER-2 positive breast cancer and chronic myeloid leukemia. She continues on imatinib for her CML, with good tolerance.  Karmella is feeling weak today.  She has a decreased appetite.  She notes that she is vomiting frequently with meals, she says this has been going on for, "a long time".  She is down 24 pounds in the past year.  She is living alone.  She is missing meals stating that she sometimes doesn't feel like eating.  REVIEW OF SYSTEMS:  Mirna is dizzy.  She says she feels swimmy headed.  She felt as if she was going to pass out last night.  She does have a cough and shortness of breath.  She says that someone is breaking into her house and stealing her medications.  She notes that she is missing many of her pills and thinks that it is someone who lives with her neighbors.      PAST MEDICAL HISTORY: Past Medical History:  Diagnosis Date  . Allergic rhinitis   . Anxiety   . Breast cancer ([place]) 2016   left breast  . DDD (degenerative disc disease)   . Dementia   . Dyslipidemia   . GERD (gastroesophageal reflux disease)   . Hypertension   . Leukemia ([place]) 2010  . Osteoporosis   . Personal history of chemotherapy 2016  . Personal history of radiation therapy 2016  . Radiation 04/30/15-05/1515   Left breast  . Vitamin D deficiency     PAST SURGICAL HISTORY: Past Surgical History:  Procedure Laterality Date  . BACK SURGERY  1984  . BREAST LUMPECTOMY Left 2016  .  BREAST LUMPECTOMY WITH RADIOACTIVE SEED AND SENTINEL LYMPH NODE BIOPSY Left 02/27/2015   Procedure: LEFT BREAST LUMPECTOMY WITH RADIOACTIVE SEED AND SENTINEL LYMPH NODE BIOPSY;  Surgeon: Autumn Messing III, MD;  Location: [facility];  Service: General;  Laterality: Left;    FAMILY HISTORY No family history on file. The patient's father died at the age of 82. The patient's mother died in her  62V from complications of diabetes. The patient had one full brother and one full sister there this fall sister had ovarian cancer diagnosed late in life. The patient had 14 additional half siblings. There is no other history of breast cancer in the family.  GYNECOLOGIC HISTORY:  No LMP recorded. Patient has had a hysterectomy. Menarche age 84, first live birth age 84. She is GX P5. [name] is not sure when she went through the change of life, but she had a hysterectomy remotely. She is not sure whether the ovaries were removed. She did not take hormone replacement.  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 Her husband died in 1999/11/05 and she lives by herself, with no pets, in the [employer] school apartments.. There are exercise groups there but not a gym. Her daughter [name] is Agricultural consultant for a shipping company in [place]. Granddaughter [name] (pronounced "Trey Paula") is a Education officer, museum for the child protective services in [place]. The patient has 2 sons who have died. She attends a [employer].    ADVANCED DIRECTIVES: Her daughter [name] is her healthcare power of attorney. She can be reached at [phone].   HEALTH MAINTENANCE: Social History   Tobacco Use  . Smoking status: Never Smoker  . Smokeless tobacco: Never Used  Substance Use Topics  . Alcohol use: No    Alcohol/week: 0.0 standard drinks  . Drug use: No     Colonoscopy:  PAP:  Bone density:  Lipid panel:  Allergies  Allergen Reactions  . Ace Inhibitors Cough  . Hyzaar [Losartan Potassium-Hctz] Cough  . Lipitor [Atorvastatin]     Muscle fatigue  . Losartan Cough  . Tiazac [Diltiazem Hcl Er Beads] Cough    Current Outpatient Medications  Medication Sig Dispense Refill  . ALPRAZolam (XANAX) 0.5 MG tablet Take 0.5 mg by mouth 2 (two) times daily as needed for anxiety.     Marland Kitchen aspirin 81 MG tablet Take 81 mg by mouth daily.      . carvedilol (COREG) 25 MG tablet Take 1 tablet (25 mg total) by mouth 2 (two) times daily. 90 tablet 4  . donepezil (ARICEPT) 5 MG tablet Take 5 mg by mouth at bedtime.    . furosemide (LASIX) 20 MG tablet Take 20 mg by mouth as needed.    . imatinib (GLEEVEC) 400 MG tablet TAKE 1 TABLET BY MOUTH ONCE DAILY WITH LARGE GLASS OF WATER 30 tablet 0  . irbesartan (AVAPRO) 300 MG tablet   1  . mirtazapine (REMERON) 15 MG tablet   1  . omeprazole (PRILOSEC) 20 MG capsule TAKE 1 CAPSULE BY MOUTH TWICE A DAY 180 capsule 0  . potassium chloride (K-DUR,KLOR-CON) 10 MEQ tablet Take 1 tablet (10 mEq total) by mouth as needed (with lasix.). 90 tablet 4  . Probiotic Product (ALIGN PO) Take by mouth.    . Vitamin D, Ergocalciferol, (DRISDOL) 50000 units CAPS capsule Take 50,000 Units by mouth every 30 (thirty) days.     No current facility-administered  medications for this visit.     OBJECTIVE: Elderly African-American woman who appears frail  Vitals:   06/26/18 1328  BP: (!) 93/36  Pulse: (!) 54  Resp: 16  Temp: 97.8 F (36.6 C)  SpO2: 93%     Body mass index is 17.2 kg/m.    ECOG FS:1 - Symptomatic but completely ambulatory  GENERAL: Patient is a chronically ill, thin appearing elderly woman in no acute distress HEENT:  Sclerae anicteric.  Oropharynx clear and moist. No ulcerations or evidence of oropharyngeal candidiasis. Neck is supple.  NODES:  No cervical, supraclavicular, or axillary lymphadenopathy palpated.  BREAST EXAM: Left breast s/p lumpectomy, no sign of recurrence, right breast benign LUNGS:  Bibasilar rales noted.  No wheezes, otherwise clear HEART:  Regular rate and rhythm. No murmur appreciated. ABDOMEN:  Soft, nontender.  Positive, normoactive bowel sounds. No organomegaly palpated. MSK:  No focal spinal tenderness to palpation. Full range of motion bilaterally in the upper extremities. EXTREMITIES:  No peripheral edema.   SKIN:  Clear with no obvious rashes or skin changes.  No nail dyscrasia. NEURO:  Nonfocal. Well oriented.  Appropriate affect.      LAB RESULTS:  CMP     Component Value Date/Time   NA 134 (L) 06/26/2018 1238   NA 139 07/19/2017 1337   K 3.9 06/26/2018 1238   K 3.4 (L) 07/19/2017 1337   CL 99 06/26/2018 1238   CL 106 02/21/2013 1355   CO2 27 06/26/2018 1238   CO2 30 (H) 07/19/2017 1337   GLUCOSE 106 (H) 06/26/2018 1238   GLUCOSE 82 07/19/2017 1337   GLUCOSE 92 02/21/2013 1355   BUN 11 06/26/2018 1238   BUN 14.7 07/19/2017 1337   CREATININE 1.21 (H) 06/26/2018 1238   CREATININE 0.9 07/19/2017 1337   CALCIUM 9.2 06/26/2018 1238   CALCIUM 9.1 07/19/2017 1337   PROT 6.4 (L) 06/26/2018 1238   PROT 6.3 (L) 07/19/2017 1337   ALBUMIN 3.4 (L) 06/26/2018 1238   ALBUMIN 3.3 (L) 07/19/2017 1337   AST 20 06/26/2018 1238   AST 24 07/19/2017 1337   ALT 10 06/26/2018 1238   ALT 10 07/19/2017 1337   ALKPHOS 64 06/26/2018 1238   ALKPHOS 77 07/19/2017 1337   BILITOT 0.4 06/26/2018 1238   BILITOT 0.38 07/19/2017 1337   GFRNONAA 39 (L) 06/26/2018 1238   GFRAA 46 (L) 06/26/2018 1238    INo results found for: SPEP, UPEP  Lab Results  Component Value Date   WBC 3.9 (L) 06/26/2018   NEUTROABS 2.1 06/26/2018   HGB 8.4 (L) 06/26/2018   HCT 25.6 (L) 06/26/2018   MCV 98.5 06/26/2018   PLT 220 06/26/2018      Chemistry      Component Value Date/Time   NA 134 (L) 06/26/2018 1238   NA 139 07/19/2017 1337   K 3.9 06/26/2018 1238   K 3.4 (L) 07/19/2017 1337   CL 99 06/26/2018 1238   CL 106 02/21/2013 1355   CO2 27 06/26/2018 1238   CO2 30 (H) 07/19/2017 1337   BUN 11 06/26/2018 1238   BUN 14.7 07/19/2017 1337   CREATININE 1.21 (H) 06/26/2018 1238   CREATININE 0.9 07/19/2017 1337      Component Value Date/Time   CALCIUM 9.2 06/26/2018 1238   CALCIUM 9.1 07/19/2017 1337   ALKPHOS 64 06/26/2018 1238   ALKPHOS 77 07/19/2017 1337   AST 20 06/26/2018 1238   AST 24 07/19/2017 1337   ALT 10 06/26/2018 1238  ALT 10 07/19/2017 1337    BILITOT 0.4 06/26/2018 1238   BILITOT 0.38 07/19/2017 1337       No results found for: LABCA2  No components found for: LABCA125  No results for input(s): INR in the last 168 hours.  Urinalysis    Component Value Date/Time   COLORURINE YELLOW 06/12/2014 2231   APPEARANCEUR CLEAR 06/12/2014 2231   LABSPEC 1.010 10/21/2015 1135   PHURINE 5.0 10/21/2015 1135   PHURINE 6.5 06/12/2014 2231   GLUCOSEU Negative 10/21/2015 1135   HGBUR Negative 10/21/2015 1135   HGBUR NEGATIVE 06/12/2014 2231   BILIRUBINUR Negative 10/21/2015 1135   KETONESUR Negative 10/21/2015 1135   KETONESUR NEGATIVE 06/12/2014 2231   PROTEINUR Negative 10/21/2015 1135   PROTEINUR NEGATIVE 06/12/2014 2231   UROBILINOGEN 0.2 10/21/2015 1135   NITRITE Negative 10/21/2015 1135   NITRITE NEGATIVE 06/12/2014 2231   LEUKOCYTESUR Negative 10/21/2015 1135    STUDIES: Repeat mammography due August 2019  ASSESSMENT: 84 y.o. [place] woman status post left breast Upper outer quadrant biopsy 01/23/2015 for a clinical T1b N0, stage IA invasive ductal carcinoma, grade 3, estrogen and progesterone receptor negative, but HER-2 amplified, with an MIB-1 of 79%  (1) genetics testing requested--patient with breast cancer, family history of ovarian cancer  (2) left lumpectomy with sentinel lymph node biopsy on 02/27/2015  (3) adjuvant anti-HER-2 immunotherapy with trastuzumab every 3 weeks through 1 year, started 03/28/15. Patient declined chemotherapy (abraxane).  (a) echocardiogram 09/29/2015 showed an ejection fraction of 45-50%, with diffuse hypokinesis. Herceptin interrupted after 09/09/2015 dose  (b) echocardiogram of 03/23/2016 shows an ejection fraction of 60-65%.  (4) radiation 04/30/15-05/29/15: Left breast/ 42.72 Gy at 2.67 Gy per fraction x 21 fractions.  Left breast boost/ 10 Gy at 2 Gy per fraction x 5 fractions  (5) history of chronic myeloid leukemia, continues on imatinib/Gleevec 400 mg daily  (a)  bcr.abl / abl ratio 0.00013 as of 05/09/2015.  (B) bcr.abl not detected on August, 2018  PLAN: Jaslene is doing moderately well today.  I am concerned about her today.  She has signs of dehydration, weakness, anemia, and I am concerned that she has become unable to care for herself and her needs while living at home alone.  I am also concerned about someone stealing her medication.  I reviewed her stomach issues and lab results with Dr. Jana Hakim.  The below has been decided.  She will undergo chest xray and IV fluids today.  I have contacted social work about her and they will come and see her today to get a plan in place for her at home.      She will return in January for follow up with Dr. Jana Hakim.    A total of (30) minutes of face-to-face time was spent with this patient with greater than 50% of that time in counseling and care-coordination.   Wilber Bihari, NP  06/26/18 1:41 PM Medical Oncology and Hematology [facility] [address] [place], [place] [address] Tel. [phone]    Fax. [phone]

## 2018-06-26 NOTE — Patient Instructions (Signed)
Dehydration, Adult Dehydration is when there is not enough fluid or water in your body. This happens when you lose more fluids than you take in. Dehydration can range from mild to very bad. It should be treated right away to keep it from getting very bad. Symptoms of mild dehydration may include:  Thirst.  Dry lips.  Slightly dry mouth.  Dry, warm skin.  Dizziness. Symptoms of moderate dehydration may include:  Very dry mouth.  Muscle cramps.  Dark pee (urine). Pee may be the color of tea.  Your body making less pee.  Your eyes making fewer tears.  Heartbeat that is uneven or faster than normal (palpitations).  Headache.  Light-headedness, especially when you stand up from sitting.  Fainting (syncope). Symptoms of very bad dehydration may include:  Changes in skin, such as: ? Cold and clammy skin. ? Blotchy (mottled) or pale skin. ? Skin that does not quickly return to normal after being lightly pinched and let go (poor skin turgor).  Changes in body fluids, such as: ? Feeling very thirsty. ? Your eyes making fewer tears. ? Not sweating when body temperature is high, such as in hot weather. ? Your body making very little pee.  Changes in vital signs, such as: ? Weak pulse. ? Pulse that is more than 100 beats a minute when you are sitting still. ? Fast breathing. ? Low blood pressure.  Other changes, such as: ? Sunken eyes. ? Cold hands and feet. ? Confusion. ? Lack of energy (lethargy). ? Trouble waking up from sleep. ? Short-term weight loss. ? Unconsciousness. Follow these instructions at home:  If told by your doctor, drink an ORS: ? Make an ORS by using instructions on the package. ? Start by drinking small amounts, about  cup (120 mL) every 5-10 minutes. ? Slowly drink more until you have had the amount that your doctor said to have.  Drink enough clear fluid to keep your pee clear or pale yellow. If you were told to drink an ORS, finish the ORS  first, then start slowly drinking clear fluids. Drink fluids such as: ? Water. Do not drink only water by itself. Doing that can make the salt (sodium) level in your body get too low (hyponatremia). ? Ice chips. ? Fruit juice that you have added water to (diluted). ? Low-calorie sports drinks.  Avoid: ? Alcohol. ? Drinks that have a lot of sugar. These include high-calorie sports drinks, fruit juice that does not have water added, and soda. ? Caffeine. ? Foods that are greasy or have a lot of fat or sugar.  Take over-the-counter and prescription medicines only as told by your doctor.  Do not take salt tablets. Doing that can make the salt level in your body get too high (hypernatremia).  Eat foods that have minerals (electrolytes). Examples include bananas, oranges, potatoes, tomatoes, and spinach.  Keep all follow-up visits as told by your doctor. This is important. Contact a doctor if:  You have belly (abdominal) pain that: ? Gets worse. ? Stays in one area (localizes).  You have a rash.  You have a stiff neck.  You get angry or annoyed more easily than normal (irritability).  You are more sleepy than normal.  You have a harder time waking up than normal.  You feel: ? Weak. ? Dizzy. ? Very thirsty.  You have peed (urinated) only a small amount of very dark pee during 6-8 hours. Get help right away if:  You have symptoms of  very bad dehydration.  You cannot drink fluids without throwing up (vomiting).  Your symptoms get worse with treatment.  You have a fever.  You have a very bad headache.  You are throwing up or having watery poop (diarrhea) and it: ? Gets worse. ? Does not go away.  You have blood or something green (bile) in your throw-up.  You have blood in your poop (stool). This may cause poop to look black and tarry.  You have not peed in 6-8 hours.  You pass out (faint).  Your heart rate when you are sitting still is more than 100 beats a  minute.  You have trouble breathing. This information is not intended to replace advice given to you by your health care provider. Make sure you discuss any questions you have with your health care provider. Document Released: 06/26/2009 Document Revised: 03/19/2016 Document Reviewed: 10/24/2015 Elsevier Interactive Patient Education  2018 [employer].

## 2018-06-26 NOTE — Progress Notes (Addendum)
[facility] Clinical Social Worker  Clinical Social Worker received referral from APP for assessment of psychosocial needs and safety at home.  CSW met with patient in the infusion room to offer support and assess for needs.  Patient stated she was feeling "weak" and has had a low appetite.  Patient stated she lives alone and has limited family support, but takes pride in her independence.  "I have always done everything myself".  Patient was alert and oriented during meeting with CSW.  CSW and patient discussed resources that may offer additional assistance in the home.  Patient was very welcoming of a home health assessment, and any additional support that could be offered in the home.  Due to patients insurance resources are limited.  CSW shared patients request for home health with RN and APP who will place the referral/order.  CSW and patient also discussed meals on wheels, but patient did not want to proceed with a referral at this time.  Patient stated "I can still cook for myself and I like cooking."  CSW provided patient with Ensure/Boost samples and a bag from the [employer].  Patient was very appreciative of food assistance.  CSW asked if patient felt safe at home.  Patient stated she feels unsafe when she leaves her apartment, because she believes neighbors are coming into her apartment and stealing her canned goods and "other things".  Patient stated "I am not sure how they are getting in."  Patient stated she has reported this to the landlord, but no action has been taken.  CSW suggested that next time she suspects an incident to call the police.  Patient states she has not done that in the past, but will consider it in the future.  CSW provided contact information and encouraged patient to call with additional needs or concerns.           Johnnye Lana, MSW, LCSW, OSW-C Clinical Social Worker [facility] [phone]

## 2018-06-26 NOTE — Telephone Encounter (Signed)
Pt scheduled per [facility] for fluids. Pt sched ok per Threasa Beards

## 2018-06-27 ENCOUNTER — Telehealth: Payer: Self-pay | Admitting: Adult Health

## 2018-06-27 ENCOUNTER — Other Ambulatory Visit: Payer: Self-pay | Admitting: Adult Health

## 2018-06-27 DIAGNOSIS — I427 Cardiomyopathy due to drug and external agent: Secondary | ICD-10-CM

## 2018-06-27 DIAGNOSIS — J849 Interstitial pulmonary disease, unspecified: Secondary | ICD-10-CM

## 2018-06-27 DIAGNOSIS — Z171 Estrogen receptor negative status [ER-]: Secondary | ICD-10-CM

## 2018-06-27 DIAGNOSIS — C9210 Chronic myeloid leukemia, BCR/ABL-positive, not having achieved remission: Secondary | ICD-10-CM

## 2018-06-27 DIAGNOSIS — T451X5A Adverse effect of antineoplastic and immunosuppressive drugs, initial encounter: Secondary | ICD-10-CM

## 2018-06-27 DIAGNOSIS — C50412 Malignant neoplasm of upper-outer quadrant of left female breast: Secondary | ICD-10-CM

## 2018-06-27 NOTE — Telephone Encounter (Signed)
Per 10/14 no los

## 2018-06-28 ENCOUNTER — Encounter: Payer: Self-pay | Admitting: *Deleted

## 2018-06-28 ENCOUNTER — Other Ambulatory Visit: Payer: Self-pay | Admitting: Oncology

## 2018-06-28 DIAGNOSIS — C9210 Chronic myeloid leukemia, BCR/ABL-positive, not having achieved remission: Secondary | ICD-10-CM

## 2018-06-28 NOTE — Progress Notes (Signed)
[facility] [place] [employer]  Holiday representative contacted patient at home to follow up after appointment at [facility] this week.  Patient stated she was still feeling "very weak" and "tired", but was on her way out to go to the store.  Patient also stated home health RN was coming to her home at 5:30 today.  CSW encouraged patient to call the doctor or RN if her symptoms continue to get worse.    Johnnye Lana, MSW, LCSW, OSW-C Clinical Social Worker [facility] [phone]

## 2018-07-05 LAB — BCR/ABL

## 2018-08-01 ENCOUNTER — Other Ambulatory Visit: Payer: Self-pay | Admitting: Oncology

## 2018-08-01 DIAGNOSIS — C9210 Chronic myeloid leukemia, BCR/ABL-positive, not having achieved remission: Secondary | ICD-10-CM

## 2018-08-02 ENCOUNTER — Other Ambulatory Visit: Payer: Self-pay | Admitting: Oncology

## 2018-09-29 ENCOUNTER — Telehealth: Payer: Self-pay

## 2018-09-29 ENCOUNTER — Telehealth: Payer: Self-pay | Admitting: Oncology

## 2018-09-29 NOTE — Telephone Encounter (Signed)
TC from [facility] in reference to Pt. Pamela House , she stated that Pt has not taken gleevec in 2 months and needs to be re certified. Per [place] was informed that Pt. Needs to come in for an appointment. Schedule message sent

## 2018-09-29 NOTE — Telephone Encounter (Signed)
Scheduled appt per 1/17 sch message - pt is aware of appt date and time

## 2018-10-06 ENCOUNTER — Other Ambulatory Visit: Payer: Self-pay | Admitting: *Deleted

## 2018-10-06 DIAGNOSIS — C9210 Chronic myeloid leukemia, BCR/ABL-positive, not having achieved remission: Secondary | ICD-10-CM

## 2018-10-09 ENCOUNTER — Inpatient Hospital Stay: Payer: Medicare Other | Attending: Adult Health | Admitting: Adult Health

## 2018-10-09 ENCOUNTER — Inpatient Hospital Stay: Payer: Medicare Other

## 2018-10-09 ENCOUNTER — Encounter: Payer: Self-pay | Admitting: Adult Health

## 2018-10-25 ENCOUNTER — Other Ambulatory Visit: Payer: Self-pay | Admitting: Oncology

## 2018-11-02 ENCOUNTER — Inpatient Hospital Stay (HOSPITAL_COMMUNITY)
Admission: EM | Admit: 2018-11-02 | Discharge: 2018-11-08 | DRG: 196 | Disposition: A | Discharge status: Skilled Nursing Facility | Payer: Medicare Other | Attending: Internal Medicine | Admitting: Internal Medicine

## 2018-11-02 ENCOUNTER — Encounter (HOSPITAL_COMMUNITY): Payer: Self-pay

## 2018-11-02 ENCOUNTER — Emergency Department (HOSPITAL_COMMUNITY): Payer: Medicare Other

## 2018-11-02 ENCOUNTER — Other Ambulatory Visit: Payer: Self-pay

## 2018-11-02 DIAGNOSIS — Z515 Encounter for palliative care: Secondary | ICD-10-CM

## 2018-11-02 DIAGNOSIS — R05 Cough: Secondary | ICD-10-CM

## 2018-11-02 DIAGNOSIS — I2723 Pulmonary hypertension due to lung diseases and hypoxia: Secondary | ICD-10-CM | POA: Diagnosis present

## 2018-11-02 DIAGNOSIS — I712 Thoracic aortic aneurysm, without rupture: Secondary | ICD-10-CM | POA: Diagnosis present

## 2018-11-02 DIAGNOSIS — I248 Other forms of acute ischemic heart disease: Secondary | ICD-10-CM | POA: Diagnosis present

## 2018-11-02 DIAGNOSIS — Z7951 Long term (current) use of inhaled steroids: Secondary | ICD-10-CM

## 2018-11-02 DIAGNOSIS — I5032 Chronic diastolic (congestive) heart failure: Secondary | ICD-10-CM | POA: Diagnosis present

## 2018-11-02 DIAGNOSIS — E43 Unspecified severe protein-calorie malnutrition: Secondary | ICD-10-CM | POA: Diagnosis present

## 2018-11-02 DIAGNOSIS — M810 Age-related osteoporosis without current pathological fracture: Secondary | ICD-10-CM | POA: Diagnosis present

## 2018-11-02 DIAGNOSIS — J441 Chronic obstructive pulmonary disease with (acute) exacerbation: Secondary | ICD-10-CM | POA: Diagnosis present

## 2018-11-02 DIAGNOSIS — I2781 Cor pulmonale (chronic): Secondary | ICD-10-CM | POA: Diagnosis present

## 2018-11-02 DIAGNOSIS — Z853 Personal history of malignant neoplasm of breast: Secondary | ICD-10-CM

## 2018-11-02 DIAGNOSIS — I5082 Biventricular heart failure: Secondary | ICD-10-CM | POA: Diagnosis present

## 2018-11-02 DIAGNOSIS — C50412 Malignant neoplasm of upper-outer quadrant of left female breast: Secondary | ICD-10-CM | POA: Diagnosis not present

## 2018-11-02 DIAGNOSIS — R0600 Dyspnea, unspecified: Secondary | ICD-10-CM | POA: Diagnosis present

## 2018-11-02 DIAGNOSIS — Z171 Estrogen receptor negative status [ER-]: Secondary | ICD-10-CM | POA: Diagnosis not present

## 2018-11-02 DIAGNOSIS — K219 Gastro-esophageal reflux disease without esophagitis: Secondary | ICD-10-CM | POA: Diagnosis present

## 2018-11-02 DIAGNOSIS — I361 Nonrheumatic tricuspid (valve) insufficiency: Secondary | ICD-10-CM | POA: Diagnosis not present

## 2018-11-02 DIAGNOSIS — J849 Interstitial pulmonary disease, unspecified: Secondary | ICD-10-CM | POA: Diagnosis present

## 2018-11-02 DIAGNOSIS — J9601 Acute respiratory failure with hypoxia: Secondary | ICD-10-CM | POA: Diagnosis present

## 2018-11-02 DIAGNOSIS — Z888 Allergy status to other drugs, medicaments and biological substances status: Secondary | ICD-10-CM

## 2018-11-02 DIAGNOSIS — E861 Hypovolemia: Secondary | ICD-10-CM | POA: Diagnosis present

## 2018-11-02 DIAGNOSIS — R0602 Shortness of breath: Secondary | ICD-10-CM | POA: Diagnosis present

## 2018-11-02 DIAGNOSIS — E41 Nutritional marasmus: Secondary | ICD-10-CM | POA: Diagnosis present

## 2018-11-02 DIAGNOSIS — I10 Essential (primary) hypertension: Secondary | ICD-10-CM | POA: Diagnosis not present

## 2018-11-02 DIAGNOSIS — Z9221 Personal history of antineoplastic chemotherapy: Secondary | ICD-10-CM

## 2018-11-02 DIAGNOSIS — K449 Diaphragmatic hernia without obstruction or gangrene: Secondary | ICD-10-CM | POA: Diagnosis present

## 2018-11-02 DIAGNOSIS — R627 Adult failure to thrive: Secondary | ICD-10-CM | POA: Diagnosis present

## 2018-11-02 DIAGNOSIS — E559 Vitamin D deficiency, unspecified: Secondary | ICD-10-CM | POA: Diagnosis present

## 2018-11-02 DIAGNOSIS — I509 Heart failure, unspecified: Secondary | ICD-10-CM

## 2018-11-02 DIAGNOSIS — C9210 Chronic myeloid leukemia, BCR/ABL-positive, not having achieved remission: Secondary | ICD-10-CM | POA: Diagnosis present

## 2018-11-02 DIAGNOSIS — I110 Hypertensive heart disease with heart failure: Secondary | ICD-10-CM | POA: Diagnosis present

## 2018-11-02 DIAGNOSIS — Z681 Body mass index (BMI) 19 or less, adult: Secondary | ICD-10-CM | POA: Diagnosis not present

## 2018-11-02 DIAGNOSIS — J84112 Idiopathic pulmonary fibrosis: Principal | ICD-10-CM | POA: Diagnosis present

## 2018-11-02 DIAGNOSIS — J8489 Other specified interstitial pulmonary diseases: Secondary | ICD-10-CM | POA: Diagnosis not present

## 2018-11-02 DIAGNOSIS — Z79899 Other long term (current) drug therapy: Secondary | ICD-10-CM

## 2018-11-02 DIAGNOSIS — Z923 Personal history of irradiation: Secondary | ICD-10-CM

## 2018-11-02 DIAGNOSIS — Z8041 Family history of malignant neoplasm of ovary: Secondary | ICD-10-CM

## 2018-11-02 DIAGNOSIS — F419 Anxiety disorder, unspecified: Secondary | ICD-10-CM | POA: Diagnosis present

## 2018-11-02 DIAGNOSIS — M7989 Other specified soft tissue disorders: Secondary | ICD-10-CM | POA: Diagnosis not present

## 2018-11-02 DIAGNOSIS — Z66 Do not resuscitate: Secondary | ICD-10-CM | POA: Diagnosis present

## 2018-11-02 DIAGNOSIS — Z7982 Long term (current) use of aspirin: Secondary | ICD-10-CM

## 2018-11-02 DIAGNOSIS — F0390 Unspecified dementia without behavioral disturbance: Secondary | ICD-10-CM | POA: Diagnosis present

## 2018-11-02 DIAGNOSIS — R0609 Other forms of dyspnea: Secondary | ICD-10-CM | POA: Diagnosis not present

## 2018-11-02 DIAGNOSIS — E871 Hypo-osmolality and hyponatremia: Secondary | ICD-10-CM | POA: Diagnosis present

## 2018-11-02 DIAGNOSIS — R059 Cough, unspecified: Secondary | ICD-10-CM

## 2018-11-02 DIAGNOSIS — Z7401 Bed confinement status: Secondary | ICD-10-CM

## 2018-11-02 LAB — COMPREHENSIVE METABOLIC PANEL
ALT: 14 U/L (ref 0–44)
ANION GAP: 7 (ref 5–15)
AST: 30 U/L (ref 15–41)
Albumin: 3.7 g/dL (ref 3.5–5.0)
Alkaline Phosphatase: 75 U/L (ref 38–126)
BUN: 16 mg/dL (ref 8–23)
CO2: 31 mmol/L (ref 22–32)
Calcium: 9.7 mg/dL (ref 8.9–10.3)
Chloride: 88 mmol/L — ABNORMAL LOW (ref 98–111)
Creatinine, Ser: 0.75 mg/dL (ref 0.44–1.00)
GFR calc non Af Amer: >60 mL/min (ref >60)
Glucose, Bld: 76 mg/dL (ref 70–99)
Potassium: 4.9 mmol/L (ref 3.5–5.1)
Sodium: 126 mmol/L — ABNORMAL LOW (ref 135–145)
Total Bilirubin: 0.5 mg/dL (ref 0.3–1.2)
Total Protein: 7.1 g/dL (ref 6.5–8.1)

## 2018-11-02 LAB — URINALYSIS, ROUTINE W REFLEX MICROSCOPIC
Bilirubin Urine: NEGATIVE
Glucose, UA: NEGATIVE mg/dL
HGB URINE DIPSTICK: NEGATIVE
Ketones, ur: NEGATIVE mg/dL
Leukocytes,Ua: NEGATIVE
Nitrite: NEGATIVE
PROTEIN: NEGATIVE mg/dL
Specific Gravity, Urine: 1.004 — ABNORMAL LOW (ref 1.005–1.030)
pH: 8 (ref 5.0–8.0)

## 2018-11-02 LAB — CBC WITH DIFFERENTIAL/PLATELET
Abs Immature Granulocytes: 0.01 10*3/uL (ref 0.00–0.07)
Basophils Absolute: 0.1 10*3/uL (ref 0.0–0.1)
Basophils Relative: 2 %
Eosinophils Absolute: 0.2 10*3/uL (ref 0.0–0.5)
Eosinophils Relative: 6 %
HCT: 41 % (ref 36.0–46.0)
Hemoglobin: 13 g/dL (ref 12.0–15.0)
Immature Granulocytes: 0 %
Lymphocytes Relative: 36 %
Lymphs Abs: 1 10*3/uL (ref 0.7–4.0)
MCH: 29.3 pg (ref 26.0–34.0)
MCHC: 31.7 g/dL (ref 30.0–36.0)
MCV: 92.6 fL (ref 80.0–100.0)
Monocytes Absolute: 0.4 10*3/uL (ref 0.1–1.0)
Monocytes Relative: 13 %
Neutro Abs: 1.3 10*3/uL — ABNORMAL LOW (ref 1.7–7.7)
Neutrophils Relative %: 43 %
Platelets: 331 10*3/uL (ref 150–400)
RBC: 4.43 MIL/uL (ref 3.87–5.11)
RDW: 13.5 % (ref 11.5–15.5)
WBC: 2.9 10*3/uL — AB (ref 4.0–10.5)
nRBC: 0 % (ref 0.0–0.2)

## 2018-11-02 LAB — TROPONIN I
TROPONIN I: 0.11 ng/mL — AB (ref <0.03)
Troponin I: 0.13 ng/mL (ref <0.03)

## 2018-11-02 LAB — I-STAT TROPONIN, ED: Troponin i, poc: 0.09 ng/mL (ref 0.00–0.08)

## 2018-11-02 LAB — BRAIN NATRIURETIC PEPTIDE: B NATRIURETIC PEPTIDE 5: 409.2 pg/mL — AB (ref 0.0–100.0)

## 2018-11-02 LAB — OSMOLALITY, URINE: OSMOLALITY UR: 252 mosm/kg — AB (ref 300–900)

## 2018-11-02 MED ORDER — SODIUM CHLORIDE 0.9 % IV SOLN: 250.0000 mL | INTRAVENOUS | Status: DC | PRN

## 2018-11-02 MED ORDER — SODIUM CHLORIDE 0.9% FLUSH: 3.0000 mL | INTRAVENOUS | Status: DC | PRN

## 2018-11-02 MED ORDER — ALBUTEROL SULFATE (2.5 MG/3ML) 0.083% IN NEBU: 2.5000 mg | INHALATION_SOLUTION | RESPIRATORY_TRACT | Status: DC | PRN

## 2018-11-02 MED ORDER — ENOXAPARIN SODIUM 30 MG/0.3ML ~~LOC~~ SOLN
30.0000 mg | SUBCUTANEOUS | Status: DC
  Administered 2018-11-03 – 2018-11-07 (×5): 30 mg via SUBCUTANEOUS
  Filled 2018-11-02 (×5): qty 0.3

## 2018-11-02 MED ORDER — FUROSEMIDE 10 MG/ML IJ SOLN
40.0000 mg | Freq: Once | INTRAMUSCULAR | Status: AC
  Administered 2018-11-02: 40 mg via INTRAVENOUS
  Filled 2018-11-02: qty 4

## 2018-11-02 MED ORDER — AMLODIPINE BESYLATE 5 MG PO TABS
5.0000 mg | ORAL_TABLET | Freq: Every day | ORAL | Status: DC
  Administered 2018-11-04 – 2018-11-08 (×5): 5 mg via ORAL
  Filled 2018-11-02 (×6): qty 1

## 2018-11-02 MED ORDER — ONDANSETRON HCL 4 MG PO TABS: 4.0000 mg | ORAL_TABLET | Freq: Four times a day (QID) | ORAL | Status: DC | PRN

## 2018-11-02 MED ORDER — DONEPEZIL HCL 5 MG PO TABS
5.0000 mg | ORAL_TABLET | Freq: Every day | ORAL | Status: DC
  Administered 2018-11-02 – 2018-11-07 (×6): 5 mg via ORAL
  Filled 2018-11-02 (×6): qty 1

## 2018-11-02 MED ORDER — ACETAMINOPHEN 325 MG PO TABS: 650.0000 mg | ORAL_TABLET | Freq: Four times a day (QID) | ORAL | Status: DC | PRN

## 2018-11-02 MED ORDER — ASPIRIN EC 81 MG PO TBEC
81.0000 mg | DELAYED_RELEASE_TABLET | Freq: Every day | ORAL | Status: DC
  Administered 2018-11-02 – 2018-11-08 (×7): 81 mg via ORAL
  Filled 2018-11-02 (×7): qty 1

## 2018-11-02 MED ORDER — ONDANSETRON HCL 4 MG/2ML IJ SOLN: 4.0000 mg | Freq: Four times a day (QID) | INTRAMUSCULAR | Status: DC | PRN

## 2018-11-02 MED ORDER — ENOXAPARIN SODIUM 40 MG/0.4ML ~~LOC~~ SOLN
40.0000 mg | SUBCUTANEOUS | Status: DC
  Administered 2018-11-02: 40 mg via SUBCUTANEOUS
  Filled 2018-11-02: qty 0.4

## 2018-11-02 MED ORDER — CARVEDILOL 25 MG PO TABS
25.0000 mg | ORAL_TABLET | Freq: Two times a day (BID) | ORAL | Status: DC
  Administered 2018-11-02 – 2018-11-08 (×11): 25 mg via ORAL
  Filled 2018-11-02 (×12): qty 1

## 2018-11-02 MED ORDER — HYDRALAZINE HCL 20 MG/ML IJ SOLN
5.0000 mg | Freq: Four times a day (QID) | INTRAMUSCULAR | Status: DC | PRN
  Administered 2018-11-03: 5 mg via INTRAVENOUS
  Filled 2018-11-02: qty 1

## 2018-11-02 MED ORDER — SODIUM CHLORIDE 0.9% FLUSH
3.0000 mL | Freq: Two times a day (BID) | INTRAVENOUS | Status: DC
  Administered 2018-11-02 – 2018-11-07 (×9): 3 mL via INTRAVENOUS

## 2018-11-02 MED ORDER — ADULT MULTIVITAMIN W/MINERALS CH
1.0000 | ORAL_TABLET | Freq: Every day | ORAL | Status: DC
  Administered 2018-11-02 – 2018-11-08 (×7): 1 via ORAL
  Filled 2018-11-02 (×7): qty 1

## 2018-11-02 MED ORDER — ACETAMINOPHEN 650 MG RE SUPP: 650.0000 mg | Freq: Four times a day (QID) | RECTAL | Status: DC | PRN

## 2018-11-02 MED ORDER — METHYLPREDNISOLONE SODIUM SUCC 40 MG IJ SOLR
40.0000 mg | Freq: Two times a day (BID) | INTRAMUSCULAR | Status: DC
  Administered 2018-11-02 – 2018-11-06 (×8): 40 mg via INTRAVENOUS
  Filled 2018-11-02 (×8): qty 1

## 2018-11-02 NOTE — Progress Notes (Signed)
CRITICAL VALUE ALERT  Critical Value:  Troponin 0.11  Date & Time Notied:  1805, 11/02/2018  Provider Notified: Curly Rim  Orders Received/Actions taken: Orders pending
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
## 2018-11-02 NOTE — ED Notes (Signed)
ED provider [place] made aware patient has a critical troponin value of 0.09

## 2018-11-02 NOTE — Progress Notes (Signed)
LMWH dose reduced to 30 mg for VTE px in pt w/ wt 33.8 kg  Eudelia Bunch, Pharm.D [phone] 11/02/2018 10:10 PM

## 2018-11-02 NOTE — ED Notes (Signed)
ED TO INPATIENT HANDOFF REPORT  Name/Age/Gender Pamela House 85 y.o. female  Code Status   Home/SNF/Other Home  Chief Complaint sob  Level of Care/Admitting Diagnosis ED Disposition    ED Disposition Condition [facility] Area: [facility] [[id number]]  Level of Care: Telemetry [5]  Admit to tele based on following criteria: Monitor for Ischemic changes  Diagnosis: Dyspnea [768115]  Admitting Physician: [place], Boutte  Attending Physician: Bonnielee Haff [[id number]]  Estimated length of stay: past midnight tomorrow  Certification:: I certify this patient will need inpatient services for at least 2 midnights  PT Class (Do Not Modify): Inpatient [101]  PT Acc Code (Do Not Modify): Private [1]       Medical History Past Medical History:  Diagnosis Date  . Allergic rhinitis   . Anxiety   . Breast cancer ([place]) 2016   left breast  . DDD (degenerative disc disease)   . Dementia ([place])   . Dyslipidemia   . GERD (gastroesophageal reflux disease)   . Hypertension   . Leukemia ([place]) 2010  . Osteoporosis   . Personal history of chemotherapy 2016  . Personal history of radiation therapy 2016  . Radiation 04/30/15-05/1515   Left breast  . Vitamin D deficiency     Allergies Allergies  Allergen Reactions  . Ace Inhibitors Cough  . Hyzaar [Losartan Potassium-Hctz] Cough  . Lipitor [Atorvastatin]     Muscle fatigue  . Losartan Cough  . Tiazac [Diltiazem Hcl Er Beads] Cough    IV Location/Drains/Wounds Patient Lines/Drains/Airways Status   Active Line/Drains/Airways    Name:   Placement date:   Placement time:   Site:   Days:   Peripheral IV 11/02/18 Right;Posterior Forearm   11/02/18    1100    Forearm   less than 1          Labs/Imaging Results for orders placed or performed during the hospital encounter of 11/02/18 (from the past 48 hour(s))  CBC with Differential/Platelet     Status: Abnormal   Collection Time: 11/02/18  12:00 PM  Result Value Ref Range   WBC 2.9 (L) 4.0 - 10.5 K/uL   RBC 4.43 3.87 - 5.11 MIL/uL   Hemoglobin 13.0 12.0 - 15.0 g/dL   HCT 41.0 36.0 - 46.0 %   MCV 92.6 80.0 - 100.0 fL   MCH 29.3 26.0 - 34.0 pg   MCHC 31.7 30.0 - 36.0 g/dL   RDW 13.5 11.5 - 15.5 %   Platelets 331 150 - 400 K/uL   nRBC 0.0 0.0 - 0.2 %   Neutrophils Relative % 43 %   Neutro Abs 1.3 (L) 1.7 - 7.7 K/uL   Lymphocytes Relative 36 %   Lymphs Abs 1.0 0.7 - 4.0 K/uL   Monocytes Relative 13 %   Monocytes Absolute 0.4 0.1 - 1.0 K/uL   Eosinophils Relative 6 %   Eosinophils Absolute 0.2 0.0 - 0.5 K/uL   Basophils Relative 2 %   Basophils Absolute 0.1 0.0 - 0.1 K/uL   Immature Granulocytes 0 %   Abs Immature Granulocytes 0.01 0.00 - 0.07 K/uL    Comment: Performed at [facility], [place] [address] [place], [place] [address]  Comprehensive metabolic panel     Status: Abnormal   Collection Time: 11/02/18 12:00 PM  Result Value Ref Range   Sodium 126 (L) 135 - 145 mmol/L   Potassium 4.9 3.5 - 5.1 mmol/L   Chloride 88 (L)  98 - 111 mmol/L   CO2 31 22 - 32 mmol/L   Glucose, Bld 76 70 - 99 mg/dL   BUN 16 8 - 23 mg/dL   Creatinine, Ser 0.75 0.44 - 1.00 mg/dL   Calcium 9.7 8.9 - 10.3 mg/dL   Total Protein 7.1 6.5 - 8.1 g/dL   Albumin 3.7 3.5 - 5.0 g/dL   AST 30 15 - 41 U/L   ALT 14 0 - 44 U/L   Alkaline Phosphatase 75 38 - 126 U/L   Total Bilirubin 0.5 0.3 - 1.2 mg/dL   GFR calc non Af Amer >60 >60 mL/min   GFR calc Af Amer >60 >60 mL/min   Anion gap 7 5 - 15    Comment: Performed at [facility], [place] [address] [place], [place] [address]  Brain natriuretic peptide     Status: Abnormal   Collection Time: 11/02/18 12:00 PM  Result Value Ref Range   B Natriuretic Peptide 409.2 (H) 0.0 - 100.0 pg/mL    Comment: Performed at [facility], [place] [address], [place], [place] [address]  I-stat troponin, ED     Status: Abnormal   Collection Time: 11/02/18 12:01  PM  Result Value Ref Range   Troponin i, poc 0.09 (HH) 0.00 - 0.08 ng/mL   Comment NOTIFIED PHYSICIAN    Comment 3            Comment: Due to the release kinetics of cTnI, a negative result within the first hours of the onset of symptoms does not rule out myocardial infarction with certainty. If myocardial infarction is still suspected, repeat the test at appropriate intervals.    Dg Chest 2 View  Result Date: 11/02/2018 CLINICAL DATA:  Shortness of breath. History of pulmonary fibrosis, breast cancer and CML EXAM: CHEST - 2 VIEW COMPARISON:  Chest x-ray 06/26/2018 and chest CT 12/03/2015 FINDINGS: The heart is mildly enlarged but stable. There is moderate tortuosity and ectasia of the thoracic aorta. Severe chronic lung disease/pulmonary fibrosis without definite acute overlying pulmonary process. Low lung volumes. The bony thorax is intact. IMPRESSION: Severe chronic lung disease without obvious acute overlying pulmonary process. Electronically Signed   By: Marijo Sanes M.D.   On: 11/02/2018 12:16   EKG Interpretation  Date/Time:  Thursday November 02 2018 11:41:51 EST Ventricular Rate:  74 PR Interval:    QRS Duration: 120 QT Interval:  401 QTC Calculation: 445 R Axis:   142 Text Interpretation:  Sinus rhythm Probable left atrial enlargement Consider left ventricular hypertrophy Abnrm T, consider ischemia, anterolateral lds Minimal ST elevation, lateral leads Artifact No significant change since last tracing Confirmed by Blanchie Dessert [phone]) on 11/02/2018 12:24:26 PM   Pending Labs Unresulted Labs (From admission, onward)    Start     Ordered   Signed and Held  CBC  Tomorrow morning,   R     Signed and Held   Signed and Held  Comprehensive metabolic panel  Tomorrow morning,   R     Signed and Held          Vitals/Pain Today's Vitals   11/02/18 1119 11/02/18 1330 11/02/18 1332 11/02/18 1430  BP: (!) 149/99 (!) 145/96 (!) 145/96 (!) 174/96  Pulse: 78 71 71 73   Resp: (!) 21 (!) 21 (!) 27 (!) 24  Temp: 98.2 F (36.8 C)     TempSrc: Oral     SpO2: 95% 94% 95% 98%    Isolation Precautions No  active isolations  Medications Medications  furosemide (LASIX) injection 40 mg (40 mg Intravenous Given 11/02/18 1357)    Mobility walks with device

## 2018-11-02 NOTE — H&P (Addendum)
[facility] History and Physical  Pamela House BTD:[id number] DOB: 1930/02/08 DOA: 11/02/2018   PCP: Wenda Low, MD  Specialists: Dr. Jana Hakim is her oncologist.  She has been seen by Dr. Vaughan Browner with pulmonology.  Chief Complaint: Shortness of breath  HPI: Pamela House is a 85 y.o. female with a past medical history of breast cancer, dementia, CML, interstitial lung disease, who lives by herself.  She was brought in by EMS who was called by family members due to concern for progressive shortness of breath over the past week or so.  Patient is a very poor historian due to her dementia.  Apparently she has been getting progressively short of breath over the past many days.  She would get short of breath while talking to family members over the phone.  When family member went to check on the patient today she was noted to get extremely short winded and almost collapsed on the couch.  Oxygen saturations when checked by EMS was about 90%.  She was put on 2 L.  She was brought into the hospital.  History is very limited from this patient.  Patient's daughter was at the bedside.  Patient denies any chest pain.  No nausea vomiting.  Some lower extremity swelling but not significantly so.  Denies any cough fever.  No dizziness or lightheadedness.  In the emergency department x-ray showed chronic changes of lung disease.  Her BNP was noted to be elevated.  ED provider was concerned about the lower extremity edema.  She was given a dose of Lasix.  Patient continues to be short of breath even with minimal exertion.  She will need hospitalization for further management.  Home Medications: Prior to Admission medications   Medication Sig Start Date End Date Taking? Authorizing Provider  ALPRAZolam Duanne Moron) 0.5 MG tablet Take 0.5 mg by mouth 2 (two) times daily as needed for anxiety.     [provider]  amLODipine (NORVASC) 5 MG tablet Take 5 mg by mouth daily. 10/25/18   [provider]  aspirin 81 MG tablet Take 81 mg by mouth daily.    [provider]  carvedilol (COREG) 25 MG tablet Take 1 tablet (25 mg total) by mouth 2 (two) times daily. 10/19/17   Magrinat, [name], MD  donepezil (ARICEPT) 5 MG tablet Take 5 mg by mouth at bedtime.    [provider]  fluticasone (FLONASE) 50 MCG/ACT nasal spray Place 2 sprays into both nostrils daily. 09/10/18   [provider]  furosemide (LASIX) 20 MG tablet Take 20 mg by mouth as needed.    [provider]  imatinib (GLEEVEC) 400 MG tablet TAKE 1 TABLET BY MOUTH ONCE DAILY WITH LARGE GLASS OF WATER Patient taking differently: Take 400 mg by mouth daily. TAKE 1 TABLET BY MOUTH ONCE DAILY WITH LARGE GLASS OF WATER 08/01/18   Magrinat, [name], MD  irbesartan (AVAPRO) 300 MG tablet  07/13/17   [provider]  mirtazapine (REMERON) 15 MG tablet  07/13/17   [provider]  omeprazole (PRILOSEC) 20 MG capsule TAKE 1 CAPSULE BY MOUTH TWICE A DAY Patient taking differently: Take 20 mg by mouth 2 (two) times daily before a meal.  10/25/18   Magrinat, [name], MD  potassium chloride (K-DUR,KLOR-CON) 10 MEQ tablet Take 1 tablet (10 mEq total) by mouth as needed (with lasix.). 10/19/17   Magrinat, [name], MD  Probiotic Product (ALIGN PO) Take by mouth.    [provider]  Vitamin D, Ergocalciferol, (DRISDOL) 50000 units CAPS capsule Take 50,000 Units by mouth every 30 (thirty) days.    [provider]    Allergies:  Allergies  Allergen Reactions  . Ace Inhibitors Cough  . Hyzaar [Losartan Potassium-Hctz] Cough  . Lipitor [Atorvastatin]     Muscle fatigue  . Losartan Cough  . Tiazac [Diltiazem Hcl Er Beads] Cough    Past Medical History: Past Medical History:  Diagnosis Date  . Allergic rhinitis   . Anxiety   . Breast cancer ([place]) 2016   left breast  . DDD (degenerative disc disease)   . Dementia ([place])   . Dyslipidemia   . GERD  (gastroesophageal reflux disease)   . Hypertension   . Leukemia ([place]) 2010  . Osteoporosis   . Personal history of chemotherapy 2016  . Personal history of radiation therapy 2016  . Radiation 04/30/15-05/1515   Left breast  . Vitamin D deficiency     Past Surgical History:  Procedure Laterality Date  . BACK SURGERY  1984  . BREAST LUMPECTOMY Left 2016  . BREAST LUMPECTOMY WITH RADIOACTIVE SEED AND SENTINEL LYMPH NODE BIOPSY Left 02/27/2015   Procedure: LEFT BREAST LUMPECTOMY WITH RADIOACTIVE SEED AND SENTINEL LYMPH NODE BIOPSY;  Surgeon: Autumn Messing III, MD;  Location: [facility];  Service: General;  Laterality: Left;    Social History: She lives by herself.  No history of smoking or alcohol use.  Unclear as to her activity level at home.   Family History: Unable to do as the patient has dementia  Review of Systems -unable to do due to dementia  Physical Examination  Vitals:   11/02/18 1330 11/02/18 1332 11/02/18 1430 11/02/18 1609  BP: (!) 145/96 (!) 145/96 (!) 174/96 (!) 176/108  Pulse: 71 71 73 82  Resp: (!) 21 (!) 27 (!) 24 16  Temp:    97.8 F (36.6 C)  TempSrc:    Oral  SpO2: 94% 95% 98% 97%  Weight:    33.8 kg  Height:    _0  (1.6 m)    BP (!) 176/108 (BP Location: Right Arm)   Pulse 82   Temp 97.8 F (36.6 C) (Oral)   Resp 16   Ht _1  (1.6 m)   Wt 33.8 kg   SpO2 97%   BMI 13.20 kg/m   General appearance: alert, cooperative, appears stated age, distracted and no distress Head: Normocephalic, without obvious abnormality, atraumatic Eyes: conjunctivae/corneas clear. [name], EOM's intact. Throat: lips, mucosa, and tongue normal; teeth and gums normal Neck: no adenopathy, no carotid bruit, no JVD, supple, symmetrical, trachea midline and thyroid not enlarged, symmetric, no tenderness/mass/nodules Resp: Tachypneic at rest.  No use of accessory muscles.  Crackles heard bilaterally.  No wheezing or rhonchi. Cardio: regular rate and rhythm, S1, S2  normal, no murmur, click, rub or gallop GI: soft, non-tender; bowel sounds normal; no masses,  no organomegaly Extremities: Very minimal edema noted in the lower extremities.  Nonpitting. Pulses: 2+ and symmetric Skin: Skin color, texture, turgor normal. No rashes or lesions Lymph nodes: Cervical, supraclavicular, and axillary nodes normal. Neurologic: No obvious focal neurological deficits.  Patient is noted to be distracted.   Labs on Admission: I have personally reviewed following labs and imaging studies  CBC: Recent Labs  Lab 11/02/18 1200  WBC 2.9*  NEUTROABS 1.3*  HGB 13.0  HCT 41.0  MCV 92.6  PLT 387   Basic Metabolic Panel: Recent Labs  Lab 11/02/18 1200  NA 126*  K 4.9  CL 88*  CO2 31  GLUCOSE 76  BUN 16  CREATININE 0.75  CALCIUM 9.7   GFR: Estimated Creatinine Clearance: 26.9 mL/min (by C-G formula based on SCr of 0.75 mg/dL). Liver Function Tests: Recent Labs  Lab 11/02/18 1200  AST 30  ALT 14  ALKPHOS 75  BILITOT 0.5  PROT 7.1  ALBUMIN 3.7     Radiological Exams on Admission: Dg Chest 2 View  Result Date: 11/02/2018 CLINICAL DATA:  Shortness of breath. History of pulmonary fibrosis, breast cancer and CML EXAM: CHEST - 2 VIEW COMPARISON:  Chest x-ray 06/26/2018 and chest CT 12/03/2015 FINDINGS: The heart is mildly enlarged but stable. There is moderate tortuosity and ectasia of the thoracic aorta. Severe chronic lung disease/pulmonary fibrosis without definite acute overlying pulmonary process. Low lung volumes. The bony thorax is intact. IMPRESSION: Severe chronic lung disease without obvious acute overlying pulmonary process. Electronically Signed   By: Marijo Sanes M.D.   On: 11/02/2018 12:16    My interpretation of Electrocardiogram: Sinus rhythm in the 70s.  Normal axis.  Normal intervals.  Nonspecific T wave changes noted diffusely in the form of T inversions.  No ST segment changes.   Problem List  Principal Problem:   Dyspnea Active  Problems:   CML (chronic myelocytic leukemia) (HCC)   Hypertension   ILD (interstitial lung disease) ([place])   Malignant neoplasm of upper-outer quadrant of left breast in female, estrogen receptor negative (HCC)   Hyponatremia   Assessment: This is a 85 year old African-American female with past medical history as stated earlier who came into the emergency department due to progressively worsening shortness of breath as per her family members.  Patient does have a history of idiopathic pulmonary fibrosis.  She has been seen by pulmonology previously and was offered treatment which the patient declined.  It is quite possible she is experiencing worsening in her fibrosis which could be the reason for her symptoms.  There was concern for fluid overload but patient does not appear to be significantly fluid overloaded at this time.  Patient's a family member also report progressive weight loss over the past many months.  This is attributed to poor oral intake.  Also noted to have hyponatremia.  Plan:  1. Acute respiratory failure with hypoxia/progressive dyspnea/idiopathic pulmonary fibrosis: Symptoms could be due to worsening pulmonary fibrosis.  She could benefit from a trial of steroids which will be ordered.  No evidence for infection at this time.  We will hold off on further doses of furosemide.  She has not had an echocardiogram recently so we will order one to see if there is any element of CHF contributing to her symptoms.  2.  Mildly elevated troponin: Patient denies any chest pain.  Significance of this elevation is not entirely clear.  We will trend troponin levels.  EKG showed nonspecific changes.  3.  Hyponatremia: Check urine osmolality.  Could be due to hypervolemia but more likely due to SIADH.  Sodium level 126 on admission.  It was 134 back in October 2019.  Recheck tomorrow.  She was given Lasix today.  4. History of breast cancer: She has followed by medical oncology, Dr. Jana Hakim.   Has previously received radiation treatment.  Not noted to be on any active treatment currently.  She does have mild lower extremity edema.  Will order venous Doppler studies.  5.  History of CML: Followed by oncology.  As per the last note from October she  is supposed to be on [place].  6. Essential hypertension: Blood pressure noted to be elevated.  Resume her antihypertensive medications.  7. Weight loss: Patient apparently has lost weight over the last many months.  Patient's daughter unable to quantify.  This is attributed to poor oral intake.  Old records reviewed.  Her weight has been recorded as 33.8 kg today.  She weighed 49.8 kg in February 2019.  So indeed this is a significant weight loss.  Patient with a history of dementia. She also has history of cancer as outlined above.  Further evaluation of this can be pursued in the outpatient setting however will involve palliative medicine to assist with goals of care.  Nutritionist consult.  DVT Prophylaxis: Lovenox Code Status: Full code Family Communication: Discussed with the patient's daughter Disposition: Unclear for now.  Patient apparently lives alone which is concerning due to her history of dementia.  PT and OT consulted. Consults called: None Admission Status: Inpatient  Severity of Illness: The appropriate patient status for this patient is INPATIENT. Inpatient status is judged to be reasonable and necessary in order to provide the required intensity of service to ensure the patient's safety. The patient's presenting symptoms, physical exam findings, and initial radiographic and laboratory data in the context of their chronic comorbidities is felt to place them at high risk for further clinical deterioration. Furthermore, it is not anticipated that the patient will be medically stable for discharge from the hospital within 2 midnights of admission. The following factors support the patient status of inpatient.   " The patient's  presenting symptoms include dyspnea. " The worrisome physical exam findings include tachypneic, coarse crackles. " The initial radiographic and laboratory data are worrisome because of pulmonary fibrosis. " The chronic co-morbidities include dementia.   * I certify that at the point of admission it is my clinical judgment that the patient will require inpatient hospital care spanning beyond 2 midnights from the point of admission due to high intensity of service, high risk for further deterioration and high frequency of surveillance required.*  Further management decisions will depend on results of further testing and patient's response to treatment.  Vendetta Pittinger [employer]  [facility] operational officer on [email or web address]  11/02/2018, 5:33 PM

## 2018-11-02 NOTE — ED Triage Notes (Signed)
The pt. Lives by herself. During recent phone contact with her family indicated pt. Was "more short of breath than usual". Pt. Has hx of breast cancer and C.M.L. she arrives on our unit awake, alert and confused per her baseline. Her daughter arrives during dr. Plunkett's exam.

## 2018-11-02 NOTE — ED Notes (Signed)
Dr. Maryland Pink made aware of pt's BP of 174/96.

## 2018-11-02 NOTE — ED Notes (Signed)
Bed: WS56 Expected date:  Expected time:  Means of arrival:  Comments: EMS-SOB

## 2018-11-03 ENCOUNTER — Inpatient Hospital Stay (HOSPITAL_COMMUNITY): Payer: Medicare Other

## 2018-11-03 DIAGNOSIS — Z515 Encounter for palliative care: Secondary | ICD-10-CM

## 2018-11-03 DIAGNOSIS — Z66 Do not resuscitate: Secondary | ICD-10-CM

## 2018-11-03 DIAGNOSIS — J84112 Idiopathic pulmonary fibrosis: Secondary | ICD-10-CM

## 2018-11-03 DIAGNOSIS — M7989 Other specified soft tissue disorders: Secondary | ICD-10-CM

## 2018-11-03 DIAGNOSIS — I361 Nonrheumatic tricuspid (valve) insufficiency: Secondary | ICD-10-CM

## 2018-11-03 LAB — ECHOCARDIOGRAM COMPLETE
Height: 63 in
Weight: 1329.81 oz

## 2018-11-03 LAB — COMPREHENSIVE METABOLIC PANEL
ALK PHOS: 76 U/L (ref 38–126)
ALT: 13 U/L (ref 0–44)
AST: 27 U/L (ref 15–41)
Albumin: 3.4 g/dL — ABNORMAL LOW (ref 3.5–5.0)
Anion gap: 9 (ref 5–15)
BILIRUBIN TOTAL: 0.4 mg/dL (ref 0.3–1.2)
BUN: 16 mg/dL (ref 8–23)
CO2: 30 mmol/L (ref 22–32)
Calcium: 9.5 mg/dL (ref 8.9–10.3)
Chloride: 85 mmol/L — ABNORMAL LOW (ref 98–111)
Creatinine, Ser: 0.78 mg/dL (ref 0.44–1.00)
GFR calc Af Amer: >60 mL/min (ref >60)
GFR calc non Af Amer: >60 mL/min (ref >60)
GLUCOSE: 127 mg/dL — AB (ref 70–99)
Potassium: 4.7 mmol/L (ref 3.5–5.1)
SODIUM: 124 mmol/L — AB (ref 135–145)
TOTAL PROTEIN: 6.7 g/dL (ref 6.5–8.1)

## 2018-11-03 LAB — CBC
HCT: 41.5 % (ref 36.0–46.0)
Hemoglobin: 13.4 g/dL (ref 12.0–15.0)
MCH: 29 pg (ref 26.0–34.0)
MCHC: 32.3 g/dL (ref 30.0–36.0)
MCV: 89.8 fL (ref 80.0–100.0)
Platelets: 329 10*3/uL (ref 150–400)
RBC: 4.62 MIL/uL (ref 3.87–5.11)
RDW: 13.2 % (ref 11.5–15.5)
WBC: 2.5 10*3/uL — ABNORMAL LOW (ref 4.0–10.5)
nRBC: 0 % (ref 0.0–0.2)

## 2018-11-03 LAB — TROPONIN I: Troponin I: 0.08 ng/mL (ref <0.03)

## 2018-11-03 MED ORDER — POLYETHYLENE GLYCOL 3350 17 G PO PACK
17.0000 g | PACK | Freq: Two times a day (BID) | ORAL | Status: DC
  Administered 2018-11-03 – 2018-11-06 (×4): 17 g via ORAL
  Filled 2018-11-03 (×6): qty 1

## 2018-11-03 MED ORDER — IPRATROPIUM-ALBUTEROL 0.5-2.5 (3) MG/3ML IN SOLN
3.0000 mL | Freq: Four times a day (QID) | RESPIRATORY_TRACT | Status: DC
  Administered 2018-11-03 – 2018-11-04 (×2): 3 mL via RESPIRATORY_TRACT
  Filled 2018-11-03 (×2): qty 3

## 2018-11-03 MED ORDER — IPRATROPIUM-ALBUTEROL 0.5-2.5 (3) MG/3ML IN SOLN
3.0000 mL | Freq: Four times a day (QID) | RESPIRATORY_TRACT | Status: DC
  Administered 2018-11-03: 3 mL via RESPIRATORY_TRACT
  Filled 2018-11-03: qty 3

## 2018-11-03 MED ORDER — MORPHINE SULFATE (PF) 2 MG/ML IV SOLN: 1.0000 mg | INTRAVENOUS | Status: DC | PRN

## 2018-11-03 MED ORDER — ENSURE ENLIVE PO LIQD
237.0000 mL | Freq: Three times a day (TID) | ORAL | Status: DC
  Administered 2018-11-03 – 2018-11-08 (×14): 237 mL via ORAL

## 2018-11-03 MED ORDER — SENNOSIDES-DOCUSATE SODIUM 8.6-50 MG PO TABS
2.0000 | ORAL_TABLET | Freq: Two times a day (BID) | ORAL | Status: DC
  Administered 2018-11-03 – 2018-11-06 (×6): 2 via ORAL
  Filled 2018-11-03 (×8): qty 2

## 2018-11-03 MED ORDER — BENZONATATE 100 MG PO CAPS
200.0000 mg | ORAL_CAPSULE | Freq: Three times a day (TID) | ORAL | Status: DC
  Administered 2018-11-03 – 2018-11-08 (×12): 200 mg via ORAL
  Filled 2018-11-03 (×15): qty 2

## 2018-11-03 NOTE — Evaluation (Signed)
Physical Therapy Evaluation Patient Details Name: Pamela House MRN: [id number] DOB: May 29, 1930 Today's Date: 11/03/2018   History of Present Illness  85 y.o. female with a past medical history of breast cancer, dementia, CML, interstitial lung disease, who lives by herself. She was brought in by EMS who was called by family members due to concern for progressive shortness of breath over the past week or so.  Pt admitted for Acute respiratory failure with hypoxia/progressive dyspnea/idiopathic pulmonary fibrosis  Clinical Impression  Pt admitted with above diagnosis. Pt currently with functional limitations due to the deficits listed below (see PT Problem List).  Pt will benefit from skilled PT to increase their independence and safety with mobility to allow discharge to the venue listed below.  Pt reports not feeling well today and a little reluctant to mobilize however more agreeable with family encouragement and remaining in room (not going into hallway).  Pt reports some dizziness with mobility however improved with rest. Pt wishes to d/c home however family agreeable with rehab. Recommend SNF for rehab if pt is agreeable.     Follow Up Recommendations SNF    Equipment Recommendations  None recommended by PT    Recommendations for Other Services       Precautions / Restrictions Precautions Precautions: Fall Precaution Comments: watch O2 sats Restrictions Weight Bearing Restrictions: No Other Position/Activity Restrictions: watch O2 sats  VSS during session, SPO2 initially 89-90% room air however improved to 95% room air end of session.     Mobility  Bed Mobility Overal bed mobility: Needs Assistance Bed Mobility: Supine to Sit     Supine to sit: Min guard;HOB elevated     General bed mobility comments: extra time and effort. no physical assist.  Transfers Overall transfer level: Needs assistance Equipment used: Rolling walker (2 wheeled) Transfers: Sit to/from  Stand Sit to Stand: Min assist         General transfer comment: verbal cues for hand placement, assist to rise and steady  Ambulation/Gait Ambulation/Gait assistance: Min assist Gait Distance (Feet): 14 Feet Assistive device: Rolling walker (2 wheeled) Gait Pattern/deviations: Step-through pattern;Decreased stride length;Trunk flexed;Narrow base of support     General Gait Details: verbal cues for safe use of RW (tends to keep too far forward), SPo2 95% on room air upon sitting in recliner, pt reports mild dizziness throughout mobility  Stairs            Wheelchair Mobility    Modified Rankin (Stroke Patients Only)       Balance Overall balance assessment: Needs assistance Sitting-balance support: No upper extremity supported;Feet supported Sitting balance-Leahy Scale: Fair     Standing balance support: Bilateral upper extremity supported;During functional activity Standing balance-Leahy Scale: Poor Standing balance comment: external support needed for balance                             Pertinent Vitals/Pain Pain Assessment: Faces Faces Pain Scale: Hurts a little bit Pain Location: unspecified. Pain Intervention(s): Monitored during session;Repositioned    Home Living Family/patient expects to be discharged to:: Private residence Living Arrangements: Alone Available Help at Discharge: Family;Available PRN/intermittently(daughter and grandaughter live locally, both work FT) Type of Home: House         Home Equipment: Kasandra Knudsen - single point      Prior Function Level of Independence: Needs assistance   Gait / Transfers Assistance Needed: household ambulator, uses cane.   ADL's / Homemaking Assistance Needed:  mostly sponge bathes. no physical assist for bathing/dressing.  family assist prn with IADLs.        Hand Dominance        Extremity/Trunk Assessment   Upper Extremity Assessment Upper Extremity Assessment: Generalized weakness     Lower Extremity Assessment Lower Extremity Assessment: Generalized weakness       Communication   Communication: HOH  Cognition Arousal/Alertness: Awake/alert Behavior During Therapy: Flat affect Overall Cognitive Status: History of cognitive impairments - at baseline                                 General Comments: conversational, pleasantly confused. flat affect. family providing some correction to pt's home setup/PLOF information.       General Comments      Exercises     Assessment/Plan    PT Assessment Patient needs continued PT services  PT Problem List Decreased strength;Decreased mobility;Decreased activity tolerance;Decreased balance;Decreased knowledge of use of DME       PT Treatment Interventions DME instruction;Functional mobility training;Balance training;Gait training;Therapeutic activities;Stair training;Therapeutic exercise;Patient/family education    PT Goals (Current goals can be found in the Care Plan section)  Acute Rehab PT Goals Patient Stated Goal: pt: to go home. family: rehab at [facility]. PT Goal Formulation: With patient Time For Goal Achievement: 11/17/18 Potential to Achieve Goals: Good    Frequency Min 2X/week   Barriers to discharge        Co-evaluation PT/OT/SLP Co-Evaluation/Treatment: Yes Reason for Co-Treatment: For patient/therapist safety;To address functional/ADL transfers PT goals addressed during session: Mobility/safety with mobility OT goals addressed during session: ADL's and self-care       AM-PAC PT "6 Clicks" Mobility  Outcome Measure Help needed turning from your back to your side while in a flat bed without using bedrails?: A Little Help needed moving from lying on your back to sitting on the side of a flat bed without using bedrails?: A Little Help needed moving to and from a bed to a chair (including a wheelchair)?: A Little Help needed standing up from a chair using your arms (e.g., wheelchair or  bedside chair)?: A Little Help needed to walk in hospital room?: A Little Help needed climbing 3-5 steps with a railing? : A Lot 6 Click Score: 17    End of Session Equipment Utilized During Treatment: Gait belt Activity Tolerance: Patient limited by fatigue Patient left: in chair;with chair alarm set;with call bell/phone within reach;with family/visitor present Nurse Communication: Mobility status PT Visit Diagnosis: Other abnormalities of gait and mobility (R26.89)    Time: 0315-9458 PT Time Calculation (min) (ACUTE ONLY): 32 min   Charges:   PT Evaluation $PT Eval Low Complexity: [place], PT, DPT Acute Rehabilitation Services Office: [phone] Pager: [phone]  Trena Platt 11/03/2018, 1:11 PM

## 2018-11-03 NOTE — Progress Notes (Signed)
Patient has not voided today.  Bladder scan shows 116 ml.  Will continue to monitor.  Oncoming RN notified.

## 2018-11-03 NOTE — Consult Note (Signed)
Consultation Note Date: 11/03/2018   Patient Name: Pamela House  DOB: 09-28-1929  MRN: [id number]  Age / Sex: 85 y.o., female  PCP: Wenda Low, MD Referring Physician: Kayleen Memos, DO  Reason for Consultation: Establishing goals of care and Psychosocial/spiritual support  HPI/Patient Profile: 85 y.o. female  with past medical history of idiopathic pulmonary fibrosis, breast cancer status post chemotherapy and radiation, leukemia, and mild to moderate dementia who was admitted on 11/02/2018 with shortness of breath x1 week.  Significant weight loss was noted (30+ pounds).  Palliative medicine was consulted for goals of care.  Clinical Assessment and Goals of Care:  I have reviewed medical records including EPIC notes, labs and imaging, received report from the care team, assessed the patient and then met at the bedside along with her daughter Pamela House and her granddaughter Pamela House to discuss diagnosis prognosis, [place], EOL wishes, disposition and options.  Of note, Gabriel Cirri is a Education officer, museum who works with dialysis patients.  I introduced Palliative Medicine as specialized medical care for people living with serious illness. It focuses on providing relief from the symptoms and stress of a serious illness. The goal is to improve quality of life for both the patient and the family.  We discussed a brief life review of the patient.  The patient has 3 children living.  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  Her husband died in November 21, 1999.  Her daughter and granddaughter who are in the room with [place] today live close by and check on her frequently.  But she lives alone.  She is strong in spirit and prides herself on her independence.  As far as functional and nutritional status, she has lost a significant amount of weight.  She tells me that she regurgitates most everything she  eats.  It does not stay down long and is usually not digested when it comes back up.  She has a constant cough day and night which causes her to regurgitate and to go without sleep.  She has become more frail, has less endurance, and is more confused over the last several months.  I attempted to elicit values and goals of care important to the patient.  The patient tells me that she and her family decided long ago she does not want heroic measures should she pass.  Specifically she does not want CPR, breathing tube or feeding tube.  Her granddaughter confirms she is a DNR  We reviewed and completed a healthcare power of attorney form.  Mrs. Loiselle designated her daughter [place] as her [facility] POA.  She also designated her granddaughter Gabriel Cirri as her backup [facility] POA.  We put in a request for the chaplain to help notarized paperwork.  As Mrs. Creech is a DNR a living will is unnecessary-she would not want life prolonging procedures such as intubation or a feeding tube.  Hospice and Palliative Care services outpatient were explained and offered.  Gabriel Cirri feels that palliative care would be helpful to  her grandmother whether she goes to a skilled nursing facility at discharge or goes back home.  Questions and concerns were addressed.  The family was encouraged to call with questions or concerns.    Primary Decision Maker:  NEXT OF KIN.  Patient is confused    SUMMARY OF RECOMMENDATIONS     HC POA was completed.  Chaplain services have been consulted to notify the document.  A living will is unnecessary.  Mrs. Strohm is a DNR.  Mrs. Mclaurin does not want a feeding tube.  Palliative care to follow on discharge at SNF or at home.  Her primary complaint is vomiting and diarrhea.  It does not appear that she has ever seen a gastroenterologist.  I am uncertain that she could undergo procedures but perhaps they would have noninvasive treatment to offer that would help her symptoms.  Code  Status/Advance Care Planning:  DNR   Symptom Management:  Consider referral to gastroenterology -her granddaughter sees Dr. Cristina Gong of [place] and highly recommends him.  As the patient has an [place] PCP perhaps outpatient referral to Dr. Cristina Gong would be helpful.  Psycho-social/Spiritual:   Desire for further Chaplaincy support: Consulted for assistance with noted rising HC POA  Prognosis:   Unable to determine.  I would not be surprised if she passed in the next 6 - 12 months  Discharge Planning: To Be Determined      Primary Diagnoses: Present on Admission: . Dyspnea . Hypertension . ILD (interstitial lung disease) ([place]) . CML (chronic myelocytic leukemia) ([place]) . Hyponatremia   I have reviewed the medical record, interviewed the patient and family, and examined the patient. The following aspects are pertinent.  Past Medical History:  Diagnosis Date  . Allergic rhinitis   . Anxiety   . Breast cancer ([place]) 2016   left breast  . DDD (degenerative disc disease)   . Dementia ([place])   . Dyslipidemia   . GERD (gastroesophageal reflux disease)   . Hypertension   . Leukemia ([place]) 2010  . Osteoporosis   . Personal history of chemotherapy 2016  . Personal history of radiation therapy 2016  . Radiation 04/30/15-05/1515   Left breast  . Vitamin D deficiency    Social History   Socioeconomic History  . Marital status: Widowed    Spouse name: Not on file  . Number of children: 5  . Years of education: Not on file  . Highest education level: Not on file  Occupational History  . Not on file  Social Needs  . Financial resource strain: Not on file  . Food insecurity:    Worry: Not on file    Inability: Not on file  . Transportation needs:    Medical: Not on file    Non-medical: Not on file  Tobacco Use  . Smoking status: Never Smoker  . Smokeless tobacco: Never Used  Substance and Sexual Activity  . Alcohol use: No    Alcohol/week: 0.0 standard drinks  . Drug  use: No  . Sexual activity: Not on file  Lifestyle  . Physical activity:    Days per week: Not on file    Minutes per session: Not on file  . Stress: Not on file  Relationships  . Social connections:    Talks on phone: Not on file    Gets together: Not on file    Attends religious service: Not on file    Active member of club or organization: Not on file    Attends  meetings of clubs or organizations: Not on file    Relationship status: Not on file  Other Topics Concern  . Not on file  Social History Narrative  . Not on file   No family history on file. Scheduled Meds: . amLODipine  5 mg Oral Daily  . aspirin EC  81 mg Oral Daily  . benzonatate  200 mg Oral TID  . carvedilol  25 mg Oral BID  . donepezil  5 mg Oral QHS  . enoxaparin (LOVENOX) injection  30 mg Subcutaneous Q24H  . feeding supplement (ENSURE ENLIVE)  237 mL Oral TID BM  . ipratropium-albuterol  3 mL Nebulization Q6H WA  . methylPREDNISolone (SOLU-MEDROL) injection  40 mg Intravenous Q12H  . multivitamin with minerals  1 tablet Oral Daily  . polyethylene glycol  17 g Oral BID  . senna-docusate  2 tablet Oral BID  . sodium chloride flush  3 mL Intravenous Q12H   Continuous Infusions: . sodium chloride     PRN Meds:.sodium chloride, acetaminophen **OR** acetaminophen, albuterol, hydrALAZINE, morphine injection, ondansetron **OR** ondansetron (ZOFRAN) IV, sodium chloride flush Allergies  Allergen Reactions  . Ace Inhibitors Cough  . Hyzaar [Losartan Potassium-Hctz] Cough  . Lipitor [Atorvastatin]     Muscle fatigue  . Losartan Cough  . Tiazac [Diltiazem Hcl Er Beads] Cough   Review of Systems patient complains of constipation and diarrhea, chronic cough, chronic regurgitation, insomnia  Physical Exam  Cachectically thin pleasantly demented elderly female.  Awake alert.  She has a nonproductive cough on secretions throughout my visit CV tachycardic with murmur Respiratory some crackles.  Prominent  ribs. Abdomen thin, soft, nontender, nondistended Extremities, no edema   Vital Signs: BP 124/77 (BP Location: Right Arm)   Pulse 81   Temp 98.8 F (37.1 C) (Oral)   Resp (!) 25   Ht _0  (1.6 m)   Wt 37.7 kg   SpO2 94%   BMI 14.72 kg/m  Pain Scale: 0-10   Pain Score: 0-No pain   SpO2: SpO2: 94 % O2 Device:SpO2: 94 % O2 Flow Rate: .   IO: Intake/output summary:   Intake/Output Summary (Last 24 hours) at 11/03/2018 1638 Last data filed at 11/02/2018 2306 Gross per 24 hour  Intake 120 ml  Output 500 ml  Net -380 ml    LBM: Last BM Date: 10/31/18 Baseline Weight: Weight: 33.8 kg Most recent weight: Weight: 37.7 kg     Palliative Assessment/Data:  50%   Flowsheet Rows     Most Recent Value  Intake Tab  Referral Department  Hospitalist  Unit at Time of Referral  Med/Surg Unit  Palliative Care Primary Diagnosis  Pulmonary  Date Notified  11/02/18  Palliative Care Type  New Palliative care  Reason for referral  Clarify Goals of Care  Date of Admission  11/02/18  # of days IP prior to Palliative referral  0  Clinical Assessment  Psychosocial & Spiritual Assessment  Palliative Care Outcomes      Time In: 330 Time Out: 440 Time Total: 70 min Greater than 50%  of this time was spent counseling and coordinating care related to the above assessment and plan.  Signed by: Florentina Jenny, PA-C Palliative Medicine Pager: [phone]  Please contact Palliative Medicine Team phone at [phone] for questions and concerns.  For individual provider: See Shea Evans

## 2018-11-03 NOTE — Progress Notes (Signed)
Initial Nutrition Assessment  DOCUMENTATION CODES:   Severe malnutrition in context of chronic illness, Underweight  INTERVENTION:  - Will order Ensure Enlive TID, each supplement provides 350 kcal and 20 grams of protein. - Continue to encourage PO intakes.    NUTRITION DIAGNOSIS:   Severe Malnutrition related to chronic illness(leukemia) as evidenced by severe fat depletion, severe muscle depletion.  GOAL:   Patient will meet greater than or equal to 90% of their needs  MONITOR:   PO intake, Supplement acceptance, Weight trends, Labs  REASON FOR ASSESSMENT:   Consult Assessment of nutrition requirement/status  ASSESSMENT:   85 y.o. female with a past medical history of breast cancer, dementia, CML, interstitial lung disease, who lives by herself. She was brought in by EMS who was called by family members d/t concern for progressive SOB over the past 1 week. In the ED, x-ray showed chronic changes of lung disease. She was noted to have BLE edema.  Per chart review, patient consumed 50% of dinner last night. Patient unable to provide much information; she is hard of hearing but also responds in a way that does not align with the question asked. Patient lives alone and prepares her own food.   Her granddaughter was at bedside and states that patient is very stubborn and that encouraging patient to eat does not lead to her eating more. Family is unsure of what patient eats on a regular basis and patient did not provide examples of foods she prepares other than to say that she eats what she likes and what sounds good to her.   Provided patient with applesauce and Ensure per family request. Patient consumed 100% of applesauce. She did cough with eating it, but had consumed it very quickly. She was sipping on Ensure throughout the rest of the visit.   Granddaughter reports that patient does need/prefer softer foods but that family is unaware of any swallowing issues. Family is  concerned about weight. Current weight is 83 lb and weight on 06/26/18 was 14 lb weight loss; this indicates 14% body weight loss in 4 months which is significant for time frame. She weighed 109 lb on 10/19/17 which indicates 26 lb weight loss (24% body weight) in the past 1 year; also significant for time frame.   Encouraged providing patient with Ensure or Boost at home and also adding unflavored protein powder to her foods. Family reports that she has protein powder at home but does not use it.    Medications reviewed; 40 mg IV lasix x1 dose 2/20, 40 mg solu-medrol BID, daily multivitamin with minerals, 1 packet miralax/day, 2 tablets senokot BID. Labs reviewed; Na: 124 mmol/l, Cl: 85 mmol/l.       NUTRITION - FOCUSED PHYSICAL EXAM:    Most Recent Value  Orbital Region  Moderate depletion  Upper Arm Region  Severe depletion  Thoracic and Lumbar Region  Unable to assess  Buccal Region  Severe depletion  Temple Region  Moderate depletion  Clavicle Bone Region  Severe depletion  Clavicle and Acromion Bone Region  Severe depletion  Scapular Bone Region  Unable to assess  Dorsal Hand  Moderate depletion  Patellar Region  Moderate depletion  Anterior Thigh Region  Severe depletion  Posterior Calf Region  Severe depletion  Edema (RD Assessment)  None  Hair  Reviewed  Eyes  Reviewed  Mouth  Reviewed  Skin  Reviewed  Nails  Reviewed       Diet Order:   Diet Order  DIET SOFT Room service appropriate? Yes; Fluid consistency: Thin  Diet effective now              EDUCATION NEEDS:   Education needs have been addressed  Skin:  Skin Assessment: Reviewed RN Assessment  Last BM:  2/18  Height:   Ht Readings from Last 1 Encounters:  11/02/18 _0  (1.6 m)    Weight:   Wt Readings from Last 1 Encounters:  11/03/18 37.7 kg    Ideal Body Weight:  52.27 kg  BMI:  Body mass index is 14.72 kg/m.  Estimated Nutritional Needs:   Kcal:  1245-1400  kcal  Protein:  56-68 grams  Fluid:  >/= 1.5 L/day     Jarome Matin, MS, RD, LDN, [facility] Inpatient Clinical Dietitian Pager # [phone] After hours/weekend pager # [phone]

## 2018-11-03 NOTE — Progress Notes (Signed)
Lower extremity venous has been completed.   Preliminary results in CV Proc.   Abram Sander 11/03/2018 9:33 AM

## 2018-11-03 NOTE — Progress Notes (Signed)
  Echocardiogram 2D Echocardiogram has been performed.  Pamela House 11/03/2018, 9:52 AM

## 2018-11-03 NOTE — Progress Notes (Addendum)
PROGRESS NOTE  Pamela House BMS:[id number] DOB: 11/18/1929 DOA: 11/02/2018 PCP: Wenda Low, MD  HPI/Recap of past 24 hours: Pamela House is a 85 y.o. female with a past medical history of breast cancer, dementia, CML, interstitial lung disease, who lives by herself.  She was brought in by EMS who was called by family members due to concern for progressive shortness of breath over the past week or so.  Patient is a very poor historian due to her dementia.  Apparently she has been getting progressively short of breath over the past many days.  She would get short of breath while talking to family members over the phone.  When family member went to check on the patient today she was noted to get extremely short winded and almost collapsed on the couch.  Oxygen saturations when checked by EMS was about 90%.  She was put on 2 L.  She was brought into the hospital.  History is very limited from this patient.  Patient's daughter was at the bedside.  Patient denies any chest pain.  No nausea vomiting.  Some lower extremity swelling but not significantly so.  Denies any cough fever.  No dizziness or lightheadedness.  In the emergency department x-ray showed chronic changes of lung disease.  Her BNP was noted to be elevated.  ED provider was concerned about the lower extremity edema.  She was given a dose of Lasix.  Patient continues to be short of breath even with minimal exertion.  She will need hospitalization for further management.  11/03/18: Seen and examined with her daughter at bedside.  She has conversational dyspnea and pleuritic chest pain worse when she coughs.  Troponin mildly elevated earlier and trended up to 0.13.  Will obtain third set of troponin.  If elevated will consult cardiology.  Assessment/Plan: Principal Problem:   Dyspnea Active Problems:   CML (chronic myelocytic leukemia) (HCC)   Hypertension   ILD (interstitial lung disease) ([place])   Malignant neoplasm of upper-outer  quadrant of left breast in female, estrogen receptor negative ([place])   Hyponatremia  Acute hypoxic respiratory failure suspect secondary to idiopathic pulmonary fibrosis Started on IV Solu-Medrol Continue nebs Has not seen a pulmonologist in recent years Consult with pulmonology Maintain O2 saturation greater than 92%  Idiopathic pulmonary fibrosis Not followed by pulmonology outpatient We will consult pulmonology  Hyponatremia suspect SIADH Fluid restriction less than 1500 cc daily  Leukopenia  WBC 2.5 and ANC 1.3 Repeat CBC with differential was tomorrow  Mildly elevated troponin Trended up to 0.13 Obtain third set of troponin Reports pleuritic chest pain worse when she coughs If third set of troponin continues to increase consult cardiology Independently reviewed twelve-lead EKG done on 11/03/2018 which revealed sinus rhythm with no specific ST-T changes  Chronic diastolic CHF No sign of acute exacerbation 2D echo done on 11/03/2018 revealed LVEF 55 to 60%  Moderate protein calorie malnutrition/failure to thrive in adult Albumin 3.4 BMI 14 Encourage increasing p.o. calorie intake  History of breast cancer: She has followed by medical oncology, Dr. Jana Hakim.  Has previously received radiation treatment.  Not noted to be on any active treatment currently.  She does have mild lower extremity edema.  Will order venous Doppler studies.  History of CML: Followed by oncology.  As per the last note from October she is supposed to be on [place].  Essential hypertension: Blood pressure noted to be elevated.  Resume her antihypertensive medications.   DVT Prophylaxis: Lovenox Code Status:  Full code Family Communication:  Discussed with the patient's daughter on the phone on 11/03/2018 Disposition:  Pending clinical improvement Consults called: None   Objective: Vitals:   11/02/18 2034 11/03/18 0525 11/03/18 0551 11/03/18 0939  BP: 125/79 132/83  108/64  Pulse: 78 67  73    Resp: 18 18    Temp: 98.1 F (36.7 C) 98.4 F (36.9 C)    TempSrc: Oral Oral    SpO2: 94% 94%    Weight:   37.7 kg   Height:        Intake/Output Summary (Last 24 hours) at 11/03/2018 1430 Last data filed at 11/02/2018 2306 Gross per 24 hour  Intake 120 ml  Output 650 ml  Net -530 ml   Filed Weights   11/02/18 1609 11/03/18 0551  Weight: 33.8 kg 37.7 kg    Exam:  . General: 85 y.o. year-old female frail with conversational dyspnea.  Alert and interactive. . Cardiovascular: Regular rate and rhythm with no rubs or gallops.  No thyromegaly or JVD noted.   Marland Kitchen Respiratory: Mild rales at bases with no wheezes. Good inspiratory effort. . Abdomen: Soft nontender nondistended with normal bowel sounds x4 quadrants. . Musculoskeletal: No lower extremity edema. 2/4 pulses in all 4 extremities. Marland Kitchen Psychiatry: Mood is appropriate for condition and setting   Data Reviewed: CBC: Recent Labs  Lab 11/02/18 1200 11/03/18 0431  WBC 2.9* 2.5*  NEUTROABS 1.3*  --   HGB 13.0 13.4  HCT 41.0 41.5  MCV 92.6 89.8  PLT 331 630   Basic Metabolic Panel: Recent Labs  Lab 11/02/18 1200 11/03/18 0431  NA 126* 124*  K 4.9 4.7  CL 88* 85*  CO2 31 30  GLUCOSE 76 127*  BUN 16 16  CREATININE 0.75 0.78  CALCIUM 9.7 9.5   GFR: Estimated Creatinine Clearance: 30 mL/min (by C-G formula based on SCr of 0.78 mg/dL). Liver Function Tests: Recent Labs  Lab 11/02/18 1200 11/03/18 0431  AST 30 27  ALT 14 13  ALKPHOS 75 76  BILITOT 0.5 0.4  PROT 7.1 6.7  ALBUMIN 3.7 3.4*   No results for input(s): LIPASE, AMYLASE in the last 168 hours. No results for input(s): AMMONIA in the last 168 hours. Coagulation Profile: No results for input(s): INR, PROTIME in the last 168 hours. Cardiac Enzymes: Recent Labs  Lab 11/02/18 1707 11/02/18 2217  TROPONINI 0.11* 0.13*   BNP (last 3 results) No results for input(s): PROBNP in the last 8760 hours. HbA1C: No results for input(s): HGBA1C in the  last 72 hours. CBG: No results for input(s): GLUCAP in the last 168 hours. Lipid Profile: No results for input(s): CHOL, HDL, LDLCALC, TRIG, CHOLHDL, LDLDIRECT in the last 72 hours. Thyroid Function Tests: No results for input(s): TSH, T4TOTAL, FREET4, T3FREE, THYROIDAB in the last 72 hours. Anemia Panel: No results for input(s): VITAMINB12, FOLATE, FERRITIN, TIBC, IRON, RETICCTPCT in the last 72 hours. Urine analysis:    Component Value Date/Time   COLORURINE COLORLESS (A) 11/02/2018 1533   APPEARANCEUR CLEAR 11/02/2018 1533   LABSPEC 1.004 (L) 11/02/2018 1533   LABSPEC 1.010 10/21/2015 1135   PHURINE 8.0 11/02/2018 1533   GLUCOSEU NEGATIVE 11/02/2018 1533   GLUCOSEU Negative 10/21/2015 1135   HGBUR NEGATIVE 11/02/2018 [place] 11/02/2018 1533   BILIRUBINUR Negative 10/21/2015 [place] 11/02/2018 1533   PROTEINUR NEGATIVE 11/02/2018 1533   UROBILINOGEN 0.2 10/21/2015 1135   NITRITE NEGATIVE 11/02/2018 1533   LEUKOCYTESUR NEGATIVE 11/02/2018  1533   LEUKOCYTESUR Negative 10/21/2015 1135   Sepsis Labs: _0 (procalcitonin:4,lacticidven:4)  )No results found for this or any previous visit (from the past 240 hour(s)).    Studies: Vas [place] Lower Extremity Venous (dvt)  Result Date: 11/03/2018  Lower Venous Study Indications: Swelling.  Limitations: Body habitus. Performing Technologist: Abram Sander RVS  Examination Guidelines: A complete evaluation includes B-mode imaging, spectral Doppler, color Doppler, and power Doppler as needed of all accessible portions of each vessel. Bilateral testing is considered an integral part of a complete examination. Limited examinations for reoccurring indications may be performed as noted.  Right Venous Findings: +---------+---------------+---------+-----------+----------+-------+          CompressibilityPhasicitySpontaneityPropertiesSummary  +---------+---------------+---------+-----------+----------+-------+ CFV      Full           Yes      Yes                          +---------+---------------+---------+-----------+----------+-------+ SFJ      Full                                                 +---------+---------------+---------+-----------+----------+-------+ FV Prox  Full                                                 +---------+---------------+---------+-----------+----------+-------+ FV Mid   Full                                                 +---------+---------------+---------+-----------+----------+-------+ FV DistalFull                                                 +---------+---------------+---------+-----------+----------+-------+ PFV      Full                                                 +---------+---------------+---------+-----------+----------+-------+ POP      Full           Yes      Yes                          +---------+---------------+---------+-----------+----------+-------+ PTV      Full                                                 +---------+---------------+---------+-----------+----------+-------+ PERO     Full                                                 +---------+---------------+---------+-----------+----------+-------+  Left Venous Findings: +---------+---------------+---------+-----------+----------+--------------+          CompressibilityPhasicitySpontaneityPropertiesSummary        +---------+---------------+---------+-----------+----------+--------------+ CFV      Full           Yes      Yes                                 +---------+---------------+---------+-----------+----------+--------------+ SFJ      Full                                                        +---------+---------------+---------+-----------+----------+--------------+ FV Prox  Full                                                         +---------+---------------+---------+-----------+----------+--------------+ FV Mid   Full                                                        +---------+---------------+---------+-----------+----------+--------------+ FV DistalFull                                                        +---------+---------------+---------+-----------+----------+--------------+ PFV      Full                                                        +---------+---------------+---------+-----------+----------+--------------+ POP      Full           Yes      Yes                                 +---------+---------------+---------+-----------+----------+--------------+ PTV      Full                                                        +---------+---------------+---------+-----------+----------+--------------+ PERO                                                  Not visualized +---------+---------------+---------+-----------+----------+--------------+    Summary: Right: There is no evidence of deep vein thrombosis in the lower extremity. No cystic structure found in the popliteal fossa. Left: There is no evidence of deep vein thrombosis in the lower extremity. No cystic structure found  in the popliteal fossa.  *See table(s) above for measurements and observations.    Preliminary     Scheduled Meds: . amLODipine  5 mg Oral Daily  . aspirin EC  81 mg Oral Daily  . carvedilol  25 mg Oral BID  . donepezil  5 mg Oral QHS  . enoxaparin (LOVENOX) injection  30 mg Subcutaneous Q24H  . feeding supplement (ENSURE ENLIVE)  237 mL Oral TID BM  . methylPREDNISolone (SOLU-MEDROL) injection  40 mg Intravenous Q12H  . multivitamin with minerals  1 tablet Oral Daily  . polyethylene glycol  17 g Oral BID  . senna-docusate  2 tablet Oral BID  . sodium chloride flush  3 mL Intravenous Q12H    Continuous Infusions: . sodium chloride       LOS: 1 day     Kayleen Memos, MD [facility] Pager [phone]  If 7PM-7AM, please contact night-coverage [email or web address] Password [email or web address] 11/03/2018, 2:30 PM

## 2018-11-03 NOTE — Evaluation (Signed)
Occupational Therapy Evaluation Patient Details Name: Pamela House MRN: [id number] DOB: 06-07-1930 Today's Date: 11/03/2018    History of Present Illness 85 y.o. female with a past medical history of breast cancer, dementia, CML, interstitial lung disease, who lives by herself. She was brought in by EMS who was called by family members due to concern for progressive shortness of breath over the past week or so.   Clinical Impression   Pt admitted with the above diagnoses and presents with below problem list. Pt will benefit from continued acute OT to address the below listed deficits and maximize independence with basic ADLs prior to d/c to venue below. PTA pt lived alone at home with PRN assist from local family for IADLs. Pt was mod I with basic ADLs, used a cane and was mostly a household ambulator. Pt is currently min A for functional mobility/transfers and LB ADLs. Decreased balance and activity tolerance impacting level of assist with ADLs. Pt mostly on RA during session (very briefly on 1L) with sats 89-95 throughout session. DOE 2/4. Daughter and granddaughter present during session.     Follow Up Recommendations  SNF    Equipment Recommendations  Other (comment)(defer to next venue)    Recommendations for Other Services       Precautions / Restrictions Precautions Precautions: Fall Restrictions Weight Bearing Restrictions: No Other Position/Activity Restrictions: watch O2 sats      Mobility Bed Mobility Overal bed mobility: Needs Assistance Bed Mobility: Supine to Sit     Supine to sit: Min guard;HOB elevated     General bed mobility comments: extra time and effort. no physical assist.  Transfers Overall transfer level: Needs assistance Equipment used: Rolling walker (2 wheeled) Transfers: Sit to/from Stand Sit to Stand: Min assist         General transfer comment: from EOB to recliner. min A to steady. cues for hand placement. extra time and effort.     Balance Overall balance assessment: Needs assistance Sitting-balance support: No upper extremity supported;Feet supported Sitting balance-Leahy Scale: Fair     Standing balance support: Bilateral upper extremity supported;During functional activity Standing balance-Leahy Scale: Poor Standing balance comment: external support needed for balance                           ADL either performed or assessed with clinical judgement   ADL Overall ADL's : Needs assistance/impaired Eating/Feeding: Set up;Sitting   Grooming: Set up;Sitting;Minimal assistance   Upper Body Bathing: Minimal assistance;Sitting;Set up   Lower Body Bathing: Minimal assistance;Sit to/from stand   Upper Body Dressing : Minimal assistance;Sitting   Lower Body Dressing: Minimal assistance;Sit to/from stand   Toilet Transfer: Minimal assistance;RW;BSC;Comfort height toilet   Toileting- Clothing Manipulation and Hygiene: Minimal assistance;Sit to/from stand   Tub/ Shower Transfer: Minimal assistance;Ambulation;3 in 1;Rolling walker   Functional mobility during ADLs: Minimal assistance;Rolling walker General ADL Comments: Pt completed bed mobility, sat EOB several minutes then walked around the foot of the bed to sit up in recliner. SOB noted with minimal activity. O2 89-92 on RA during session.     Vision         Perception     Praxis      Pertinent Vitals/Pain Pain Assessment: Faces Faces Pain Scale: Hurts a little bit Pain Location: unspecified. Pain Intervention(s): Monitored during session     Hand Dominance     Extremity/Trunk Assessment Upper Extremity Assessment Upper Extremity Assessment: Generalized weakness   Lower  Extremity Assessment Lower Extremity Assessment: Generalized weakness;Defer to PT evaluation       Communication Communication Communication: HOH   Cognition Arousal/Alertness: Awake/alert Behavior During Therapy: Flat affect Overall Cognitive Status:  History of cognitive impairments - at baseline                                 General Comments: conversational, pleasantly confused. flat affect. family providing some correction to pt's home setup/PLOF information.    General Comments   Pt reporting feeling "swimmy-headed" once sitting EOB. BP assessed at 134/85.    Exercises     Shoulder Instructions      Home Living Family/patient expects to be discharged to:: Private residence Living Arrangements: Alone Available Help at Discharge: Family;Available PRN/intermittently(daughter and grandaughter live locally, both work FT) Type of Home: House             Bathroom Shower/Tub: Tub/shower unit         Home Equipment: Kasandra Knudsen - single point          Prior Functioning/Environment Level of Independence: Needs assistance  Gait / Transfers Assistance Needed: household ambulator, uses cane.  ADL's / Homemaking Assistance Needed: mostly sponge bathes. no physical assist for bathing/dressing.  family assist prn with IADLs.            OT Problem List: Decreased activity tolerance;Decreased strength;Impaired balance (sitting and/or standing);Decreased knowledge of use of DME or AE;Decreased knowledge of precautions;Cardiopulmonary status limiting activity;Pain      OT Treatment/Interventions: Self-care/ADL training;Therapeutic exercise;Energy conservation;DME and/or AE instruction;Therapeutic activities;Patient/family education;Balance training    OT Goals(Current goals can be found in the care plan section) Acute Rehab OT Goals Patient Stated Goal: pt: to go home. family: re[facility]. OT Goal Formulation: With patient/family Time For Goal Achievement: 11/17/18 Potential to Achieve Goals: Good ADL Goals Pt Will Perform Grooming: with modified independence;standing Pt Will Perform Lower Body Bathing: with modified independence;sit to/from stand Pt Will Perform Lower Body Dressing: with modified independence;sit  to/from stand Pt Will Transfer to Toilet: with modified independence;ambulating Pt Will Perform Toileting - Clothing Manipulation and hygiene: with modified independence;sit to/from stand Pt Will Perform Tub/Shower Transfer: with modified independence;ambulating;shower seat;rolling walker  OT Frequency: Min 2X/week   Barriers to D/C:            Co-evaluation PT/OT/SLP Co-Evaluation/Treatment: Yes Reason for Co-Treatment: For patient/therapist safety;Necessary to address cognition/behavior during functional activity;To address functional/ADL transfers   OT goals addressed during session: ADL's and self-care      AM-PAC OT "6 Clicks" Daily Activity     Outcome Measure Help from another person eating meals?: None Help from another person taking care of personal grooming?: A Little Help from another person toileting, which includes using toliet, bedpan, or urinal?: A Little Help from another person bathing (including washing, rinsing, drying)?: A Little Help from another person to put on and taking off regular upper body clothing?: A Little Help from another person to put on and taking off regular lower body clothing?: A Little 6 Click Score: 19   End of Session Equipment Utilized During Treatment: Gait belt;Rolling walker;Other (comment)(briefly on 1L of O2 EOB, RA majority of the time) Nurse Communication: Mobility status;Other (comment)(on RA with sats 89-95. Pt c/o abdominal discomfort.)  Activity Tolerance: Patient limited by fatigue;Other (comment)(DOE 2/4.) Patient left: in chair;with call bell/phone within reach;with chair alarm set;with family/visitor present  OT Visit Diagnosis: Unsteadiness on feet (R26.81);Pain;Muscle  weakness (generalized) (M62.81);Other symptoms and signs involving cognitive function;Adult, failure to thrive (R62.7)                Time: 8676-1950 OT Time Calculation (min): 34 min Charges:  OT General Charges $OT Visit: 1 Visit OT Evaluation $OT Eval  Moderate Complexity: [place], OT Acute Rehabilitation Services Pager: [phone] Office: [phone]   Hortencia Pilar 11/03/2018, 12:34 PM

## 2018-11-04 ENCOUNTER — Encounter (HOSPITAL_COMMUNITY): Payer: Self-pay

## 2018-11-04 DIAGNOSIS — R0600 Dyspnea, unspecified: Secondary | ICD-10-CM

## 2018-11-04 DIAGNOSIS — C50412 Malignant neoplasm of upper-outer quadrant of left female breast: Secondary | ICD-10-CM

## 2018-11-04 DIAGNOSIS — J84112 Idiopathic pulmonary fibrosis: Principal | ICD-10-CM

## 2018-11-04 DIAGNOSIS — Z171 Estrogen receptor negative status [ER-]: Secondary | ICD-10-CM

## 2018-11-04 DIAGNOSIS — J849 Interstitial pulmonary disease, unspecified: Secondary | ICD-10-CM

## 2018-11-04 LAB — CBC WITH DIFFERENTIAL/PLATELET
Abs Immature Granulocytes: 0.02 10*3/uL (ref 0.00–0.07)
BASOS PCT: 0 %
Basophils Absolute: 0 10*3/uL (ref 0.0–0.1)
Eosinophils Absolute: 0 10*3/uL (ref 0.0–0.5)
Eosinophils Relative: 0 %
HCT: 43.5 % (ref 36.0–46.0)
Hemoglobin: 14 g/dL (ref 12.0–15.0)
Immature Granulocytes: 0 %
LYMPHS PCT: 12 %
Lymphs Abs: 0.7 10*3/uL (ref 0.7–4.0)
MCH: 29.3 pg (ref 26.0–34.0)
MCHC: 32.2 g/dL (ref 30.0–36.0)
MCV: 91 fL (ref 80.0–100.0)
Monocytes Absolute: 0.3 10*3/uL (ref 0.1–1.0)
Monocytes Relative: 6 %
Neutro Abs: 4.8 10*3/uL (ref 1.7–7.7)
Neutrophils Relative %: 82 %
Platelets: 312 10*3/uL (ref 150–400)
RBC: 4.78 MIL/uL (ref 3.87–5.11)
RDW: 13.3 % (ref 11.5–15.5)
WBC: 5.8 10*3/uL (ref 4.0–10.5)
nRBC: 0 % (ref 0.0–0.2)

## 2018-11-04 LAB — COMPREHENSIVE METABOLIC PANEL
ALT: 16 U/L (ref 0–44)
AST: 28 U/L (ref 15–41)
Albumin: 3.5 g/dL (ref 3.5–5.0)
Alkaline Phosphatase: 87 U/L (ref 38–126)
Anion gap: 8 (ref 5–15)
BUN: 28 mg/dL — ABNORMAL HIGH (ref 8–23)
CO2: 34 mmol/L — ABNORMAL HIGH (ref 22–32)
Calcium: 9.9 mg/dL (ref 8.9–10.3)
Chloride: 85 mmol/L — ABNORMAL LOW (ref 98–111)
Creatinine, Ser: 0.82 mg/dL (ref 0.44–1.00)
GFR calc Af Amer: >60 mL/min (ref >60)
GFR calc non Af Amer: >60 mL/min (ref >60)
Glucose, Bld: 94 mg/dL (ref 70–99)
Potassium: 4.9 mmol/L (ref 3.5–5.1)
Sodium: 127 mmol/L — ABNORMAL LOW (ref 135–145)
Total Bilirubin: 0.4 mg/dL (ref 0.3–1.2)
Total Protein: 7.1 g/dL (ref 6.5–8.1)

## 2018-11-04 LAB — PHOSPHORUS: Phosphorus: 3.4 mg/dL (ref 2.5–4.6)

## 2018-11-04 LAB — MAGNESIUM: Magnesium: 2.3 mg/dL (ref 1.7–2.4)

## 2018-11-04 MED ORDER — SODIUM CHLORIDE 0.9 % IV SOLN: 250.0000 mL | INTRAVENOUS | Status: DC | PRN

## 2018-11-04 MED ORDER — GUAIFENESIN-DM 100-10 MG/5ML PO SYRP
5.0000 mL | ORAL_SOLUTION | ORAL | Status: DC | PRN
  Filled 2018-11-04: qty 10

## 2018-11-04 MED ORDER — IPRATROPIUM-ALBUTEROL 0.5-2.5 (3) MG/3ML IN SOLN: 3.0000 mL | Freq: Four times a day (QID) | RESPIRATORY_TRACT | Status: DC | PRN

## 2018-11-04 NOTE — Consult Note (Addendum)
NAME:  Pamela House, MRN:  [id number], DOB:  October 27, 1929, LOS: 2 ADMISSION DATE:  11/02/2018, CONSULTATION DATE: 11/04/2018 REFERRING MD: [place] Crane, CHIEF COMPLAINT: Shortness of breath  Brief History   85 year old lady with medical history significant for pulmonary fibrosis history of dementia, CML left breast cancer, cardiomyopathy Progressive shortness of breath She does have a cough, minimal sputum production  History of present illness   Progressive shortness of breath over the last few weeks Denies any chest pains or chest discomfort No recent fevers or chills No dizziness or lightheadedness  Past Medical History   Past Medical History:  Diagnosis Date  . Allergic rhinitis   . Anxiety   . Breast cancer ([place]) 2016   left breast  . DDD (degenerative disc disease)   . Dementia ([place])   . Dyslipidemia   . GERD (gastroesophageal reflux disease)   . Hypertension   . Leukemia ([place]) 2010  . Osteoporosis   . Personal history of chemotherapy 2016  . Personal history of radiation therapy 2016  . Radiation 04/30/15-05/1515   Left breast  . Vitamin D deficiency     Significant Hospital Events     Consults:  pccm 2/22  Procedures:    Significant Diagnostic Tests:  Chest x-ray with no acute infiltrate, consistent with fibrosis-reviewed by myself  Micro Data:  None  Antimicrobials:  None  Interim history/subjective:  Worsening shortness of breath prior to presenting to the hospital Has a chronic cough  Objective   Blood pressure 109/60, pulse (!) 56, temperature 98.2 F (36.8 C), temperature source Oral, resp. rate 18, height 5' 3" (1.6 m), weight 34.1 kg, SpO2 100 %.        Intake/Output Summary (Last 24 hours) at 11/04/2018 1505 Last data filed at 11/04/2018 0913 Gross per 24 hour  Intake 600 ml  Output -  Net 600 ml   Filed Weights   11/02/18 1609 11/03/18 0551 11/04/18 0432  Weight: 33.8 kg 37.7 kg 34.1 kg    Examination: General: Frail, elderly  lady HENT: Moist oral mucosa Lungs: Rales bibasilar Cardiovascular: S1-S2 appreciated Abdomen: Bowel sounds appreciated Extremities: No clubbing, no edema Neuro: Alert and oriented x3  Resolved Hospital Problem list     Assessment & Plan:  Shortness of breath -Likely multifactorial relating to pulmonary fibrosis, right heart failure -Severe pulmonary hypertension  -Oxygen supplementation as needed -Bronchodilators if she is wheezing -Agree with short course of steroids  Hypoxia -Likely multifactorial secondary to pulmonary fibrosis, right heart failure, pulmonary hypertension -May require oxygen supplementation -We will encouraged to ambulate to determine if she requires oxygen and also consider an overnight oximetry to ascertain whether she is desaturating at night  Pulmonary fibrosis -Likely progressive disease  History of CML -On Gleevec  History of breast cancer  Severe pulmonary hypertension -Relating to chronic lung disease  Cough -She is on multiple agents for the cough -I believe is optimized as can be  Hyponatremia -Stable, improving  Dementia -Continue to monitor  Best practice:  Diet: Heart healthy DVT prophylaxis: Enoxaparin Mobility: Bedrest Code Status: DNR Family Communication: Daughter at bedside Disposition:   Labs   CBC: Recent Labs  Lab 11/02/18 1200 11/03/18 0431 11/04/18 0826  WBC 2.9* 2.5* 5.8  NEUTROABS 1.3*  --  4.8  HGB 13.0 13.4 14.0  HCT 41.0 41.5 43.5  MCV 92.6 89.8 91.0  PLT 331 329 324    Basic Metabolic Panel: Recent Labs  Lab 11/02/18 1200 11/03/18 0431 11/04/18 0826  NA 126* 124*  127*  K 4.9 4.7 4.9  CL 88* 85* 85*  CO2 31 30 34*  GLUCOSE 76 127* 94  BUN 16 16 28*  CREATININE 0.75 0.78 0.82  CALCIUM 9.7 9.5 9.9  MG  --   --  2.3  PHOS  --   --  3.4   GFR: Estimated Creatinine Clearance: 26.5 mL/min (by C-G formula based on SCr of 0.82 mg/dL). Recent Labs  Lab 11/02/18 1200 11/03/18 0431  11/04/18 0826  WBC 2.9* 2.5* 5.8    Liver Function Tests: Recent Labs  Lab 11/02/18 1200 11/03/18 0431 11/04/18 0826  AST _0 ALT _1 ALKPHOS 75 76 87  BILITOT 0.5 0.4 0.4  PROT 7.1 6.7 7.1  ALBUMIN 3.7 3.4* 3.5   No results for input(s): LIPASE, AMYLASE in the last 168 hours. No results for input(s): AMMONIA in the last 168 hours.  ABG    Component Value Date/Time   TCO2 22 02/26/2015 1231     Coagulation Profile: No results for input(s): INR, PROTIME in the last 168 hours.  Cardiac Enzymes: Recent Labs  Lab 11/02/18 1707 11/02/18 2217 11/03/18 1504  TROPONINI 0.11* 0.13* 0.08*    HbA1C: No results found for: HGBA1C  CBG: No results for input(s): GLUCAP in the last 168 hours.  Review of Systems:   Review of Systems  Constitutional: Negative for chills and fever.  HENT: Negative.   Eyes: Negative.   Respiratory: Positive for cough and shortness of breath.   Cardiovascular: Negative.   Gastrointestinal: Negative.   Genitourinary: Negative.   Skin: Negative.      Past Medical History  She,  has a past medical history of Allergic rhinitis, Anxiety, Breast cancer ([place]) (2016), DDD (degenerative disc disease), Dementia ([place]), Dyslipidemia, GERD (gastroesophageal reflux disease), Hypertension, Leukemia ([place]) (2010), Osteoporosis, Personal history of chemotherapy (2016), Personal history of radiation therapy (2016), Radiation (04/30/15-05/1515), and Vitamin D deficiency.   Surgical History    Past Surgical History:  Procedure Laterality Date  . BACK SURGERY  1984  . BREAST LUMPECTOMY Left 2016  . BREAST LUMPECTOMY WITH RADIOACTIVE SEED AND SENTINEL LYMPH NODE BIOPSY Left 02/27/2015   Procedure: LEFT BREAST LUMPECTOMY WITH RADIOACTIVE SEED AND SENTINEL LYMPH NODE BIOPSY;  Surgeon: Autumn Messing III, MD;  Location: [place];  Service: General;  Laterality: Left;     Social History   reports that she has never smoked. She has never  used smokeless tobacco. She reports that she does not drink alcohol or use drugs.

## 2018-11-04 NOTE — NC FL2 (Signed)
[place] LEVEL OF CARE SCREENING TOOL     IDENTIFICATION  Patient Name: Pamela House Birthdate: [birth year] Sex: female Admission Date (Current Location): 11/02/2018  [facility] and [place] Number:  Herbalist and Address:  [facility],  [place], [place]      Provider Number: [id number]  Attending Physician Name and Address:  Kayleen Memos, DO  Relative Name and Phone Number:       Current Level of Care: Hospital Recommended Level of Care: [place] Prior Approval Number:    Date Approved/Denied:   PASRR Number: [id number]  Discharge Plan: SNF    Current Diagnoses: Patient Active Problem List   Diagnosis Date Noted  . IPF (idiopathic pulmonary fibrosis) ([place])   . Palliative care encounter   . DNR (do not resuscitate)   . Dyspnea 11/02/2018  . Hyponatremia 11/02/2018  . Cardiomyopathy due to chemotherapy ([place]) 10/02/2015  . Diarrhea 06/20/2015  . Malignant neoplasm of upper-outer quadrant of left breast in female, estrogen receptor negative ([place]) 02/07/2015  . Chronic cough 06/19/2013  . ILD (interstitial lung disease) ([place]) 06/19/2013  . Hypertension 02/21/2013  . CML (chronic myelocytic leukemia) ([place]) 05/23/2012    Orientation RESPIRATION BLADDER Height & Weight     Self, Time, Situation, Place  Normal Incontinent Weight: 75 lb 2.8 oz (34.1 kg) Height:  _0  (160 cm)  BEHAVIORAL SYMPTOMS/MOOD NEUROLOGICAL BOWEL NUTRITION STATUS      Continent Diet(soft diet)  AMBULATORY STATUS COMMUNICATION OF NEEDS Skin   Extensive Assist Verbally Normal                       Personal Care Assistance Level of Assistance  Bathing, Feeding, Dressing Bathing Assistance: Maximum assistance Feeding assistance: Independent(needs prompting sometimes) Dressing Assistance: Limited assistance     Functional Limitations Info  Sight, Hearing, Speech Sight Info: Adequate Hearing Info: Adequate Speech  Info: Adequate    SPECIAL CARE FACTORS FREQUENCY  PT (By licensed PT), OT (By licensed OT)     PT Frequency: 5x OT Frequency: 5x            Contractures Contractures Info: Not present    Additional Factors Info  Code Status, Allergies Code Status Info: DNR Allergies Info: Ace Inhibitors, Hyzaar Losartan Potassium-hctz, Lipitor Atorvastatin, Losartan, Tiazac Diltiazem Hcl Er Beads           Current Medications (11/04/2018):  This is the current hospital active medication list Current Facility-Administered Medications  Medication Dose Route Frequency Provider Last Rate Last Dose  . 0.9 %  sodium chloride infusion  250 mL Intravenous PRN Irene Pap N, DO      . acetaminophen (TYLENOL) tablet 650 mg  650 mg Oral Q6H PRN Bonnielee Haff, MD       Or  . acetaminophen (TYLENOL) suppository 650 mg  650 mg Rectal Q6H PRN Bonnielee Haff, MD      . albuterol (PROVENTIL) (2.5 MG/3ML) 0.083% nebulizer solution 2.5 mg  2.5 mg Nebulization Q2H PRN Bonnielee Haff, MD      . amLODipine (NORVASC) tablet 5 mg  5 mg Oral Daily Bonnielee Haff, MD   5 mg at 11/04/18 0913  . aspirin EC tablet 81 mg  81 mg Oral Daily Bonnielee Haff, MD   81 mg at 11/04/18 0913  . benzonatate (TESSALON) capsule 200 mg  200 mg Oral TID Irene Pap N, DO   200 mg at 11/04/18 1539  . carvedilol (  COREG) tablet 25 mg  25 mg Oral BID Bonnielee Haff, MD   25 mg at 11/04/18 5456  . donepezil (ARICEPT) tablet 5 mg  5 mg Oral QHS Bonnielee Haff, MD   5 mg at 11/03/18 2227  . enoxaparin (LOVENOX) injection 30 mg  30 mg Subcutaneous Q24H Leodis Sias T, RPH   30 mg at 11/03/18 2228  . feeding supplement (ENSURE ENLIVE) (ENSURE ENLIVE) liquid 237 mL  237 mL Oral TID BM Hall, Carole N, DO   237 mL at 11/04/18 1400  . guaiFENesin-dextromethorphan (ROBITUSSIN DM) 100-10 MG/5ML syrup 5 mL  5 mL Oral Q4H PRN Irene Pap N, DO      . hydrALAZINE (APRESOLINE) injection 5 mg  5 mg Intravenous Q6H PRN Bonnielee Haff, MD   5 mg  at 11/03/18 2025  . ipratropium-albuterol (DUONEB) 0.5-2.5 (3) MG/3ML nebulizer solution 3 mL  3 mL Nebulization Q6H PRN Irene Pap N, DO      . methylPREDNISolone sodium succinate (SOLU-MEDROL) 40 mg/mL injection 40 mg  40 mg Intravenous Q12H Bonnielee Haff, MD   40 mg at 11/04/18 2563  . morphine 2 MG/ML injection 1 mg  1 mg Intravenous Q4H PRN Irene Pap N, DO      . multivitamin with minerals tablet 1 tablet  1 tablet Oral Daily Bonnielee Haff, MD   1 tablet at 11/04/18 0913  . ondansetron (ZOFRAN) tablet 4 mg  4 mg Oral Q6H PRN Bonnielee Haff, MD       Or  . ondansetron [facility]) injection 4 mg  4 mg Intravenous Q6H PRN Bonnielee Haff, MD      . polyethylene glycol (MIRALAX / GLYCOLAX) packet 17 g  17 g Oral BID Irene Pap N, DO   17 g at 11/04/18 0915  . senna-docusate (Senokot-S) tablet 2 tablet  2 tablet Oral BID Irene Pap N, DO   2 tablet at 11/04/18 8937  . sodium chloride flush (NS) 0.9 % injection 3 mL  3 mL Intravenous Q12H Bonnielee Haff, MD   3 mL at 11/04/18 1000  . sodium chloride flush (NS) 0.9 % injection 3 mL  3 mL Intravenous PRN Bonnielee Haff, MD         Discharge Medications: Please see discharge summary for a list of discharge medications.  Relevant Imaging Results:  Relevant Lab Results:   Additional Information SS# [id number]. Needs palliative care to follow at facility  [facility], LCSW

## 2018-11-04 NOTE — Progress Notes (Addendum)
PROGRESS NOTE  [name] IRJ:[id number] DOB: [birth year] DOA: 11/02/2018 PCP: Wenda Low, MD  HPI/Recap of past 24 hours: Pamela House is a 85 y.o. female with a past medical history of breast cancer, dementia, CML, interstitial lung disease, who lives by herself.  She was brought in by EMS who was called by family members due to concern for progressive shortness of breath over the past week or so.  Patient is a very poor historian due to her dementia.  Apparently she has been getting progressively short of breath over the past many days.  She would get short of breath while talking to family members over the phone.  When family member went to check on the patient today she was noted to get extremely short winded and almost collapsed on the couch.  Oxygen saturations when checked by EMS was about 90%.  She was put on 2 L.  She was brought into the hospital.  History is very limited from this patient.  Patient's daughter was at the bedside.  Patient denies any chest pain.  No nausea vomiting.  Some lower extremity swelling but not significantly so.  Denies any cough fever.  No dizziness or lightheadedness.  In the emergency department x-ray showed chronic changes of lung disease.  Her BNP was noted to be elevated.  ED provider was concerned about the lower extremity edema.  She was given a dose of Lasix.  Patient continues to be short of breath even with minimal exertion.  She will need hospitalization for further management.  11/03/18: Seen and examined with her daughter at bedside.  She has conversational dyspnea and pleuritic chest pain worse when she coughs.  Troponin mildly elevated earlier and trended up to 0.13.  Will obtain third set of troponin.  If elevated will consult cardiology.  11/04/18: seen and examined with her daughter at her bedside.  Reports dyspnea with minimal exertion.  Noted conversational dyspnea.  Pulmonology consulted to assist in the management of her chronic  pulmonary condition  Assessment/Plan: Principal Problem:   Dyspnea Active Problems:   CML (chronic myelocytic leukemia) (H[facility])   Hypertension   ILD (interstitial lung disease) ([place])   Malignant neoplasm of upper-outer quadrant of left breast in female, estrogen receptor negative ([place])   Hyponatremia   IPF (idiopathic pulmonary fibrosis) ([place])   Palliative care encounter   DNR (do not resuscitate)  Acute hypoxic respiratory failure suspect secondary to idiopathic pulmonary fibrosis Continue IV Solu-Medrol Continue nebs Has not seen a pulmonologist in recent years Pulmonology consulted.  Highly appreciated Maintain O2 saturation greater than 92%  Idiopathic pulmonary fibrosis Not followed by pulmonology outpatient Management as recommended by pulmonology  Severe pulmonary hypertension secondary to chronic lung disease Latest 2D echo done on 11/03/2018  Hypovolemic hyponatremia  Start IV fluids 75 cc/h normal saline Monitor urine output Sodium level improving from 124 to 127  Continue to closely monitor  Resolved leukopenia   Mildly elevated troponin Peaked at 0.13 and trended down to 0.08 Reports pleuritic chest pain worse when she coughs If third set of troponin continues to increase consult cardiology Independently reviewed twelve-lead EKG done on 11/03/2018 which revealed sinus rhythm with no specific ST-T changes  Chronic diastolic CHF No sign of acute exacerbation 2D echo done on 11/03/2018 revealed LVEF 55 to 60% Severe pulmonary hypertension  Moderate protein calorie malnutrition/failure to thrive in adult Albumin 3.4 BMI 14 Encourage increasing p.o. calorie intake  History of breast cancer: She has followed by medical  oncology, Dr. Jana Hakim.  Has previously received radiation treatment.  Not noted to be on any active treatment currently.  She does have mild lower extremity edema.  Will order venous Doppler studies.  History of CML: Followed by oncology.  As  per the last note from October she is supposed to be on [place].  Essential hypertension: Blood pressure noted to be elevated.  Resume her antihypertensive medications.  Failure to thrive in an adult/physical debility/ambulatory dysfunction PT assessed and recommended SNF Encourage increase oral protein and calorie intake Fall precautions  Goals of care Palliative care team consulted to establish goals of care DNR as of 11/03/2018    DVT Prophylaxis: Lovenox Code Status:  DNR Family Communication:  Discussed with the patient's daughter on the phone on 11/03/2018 Disposition:  SNF when hemodynamically stable Consults called:  Palliative care team, pulmonology   Objective: Vitals:   11/04/18 0807 11/04/18 0811 11/04/18 0911 11/04/18 1547  BP:   109/60 (!) 97/51  Pulse:   (!) 56 66  Resp:    20  Temp:    98 F (36.7 C)  TempSrc:    Oral  SpO2: 99% 99% 100% 97%  Weight:      Height:        Intake/Output Summary (Last 24 hours) at 11/04/2018 1703 Last data filed at 11/04/2018 0913 Gross per 24 hour  Intake 600 ml  Output -  Net 600 ml   Filed Weights   11/02/18 1609 11/03/18 0551 11/04/18 0432  Weight: 33.8 kg 37.7 kg 34.1 kg    Exam:  . General: 85 y.o. year-old female frail with conversational dyspnea.  Alert and oriented x1. . Cardiovascular: Regular rate and rhythm with no rubs or gallops.  No JVD or thyromegaly . Respiratory: Mild rales at bases with no wheezes.  Poor inspiratory effort. . Abdomen: Soft nontender nondistended with normal bowel sounds x4 quadrants. . Musculoskeletal: No lower extremity edema. 2/4 pulses in all 4 extremities. Marland Kitchen Psychiatry: Mood is appropriate for condition and setting   Data Reviewed: CBC: Recent Labs  Lab 11/02/18 1200 11/03/18 0431 11/04/18 0826  WBC 2.9* 2.5* 5.8  NEUTROABS 1.3*  --  4.8  HGB 13.0 13.4 14.0  HCT 41.0 41.5 43.5  MCV 92.6 89.8 91.0  PLT 331 329 384   Basic Metabolic Panel: Recent Labs  Lab  11/02/18 1200 11/03/18 0431 11/04/18 0826  NA 126* 124* 127*  K 4.9 4.7 4.9  CL 88* 85* 85*  CO2 31 30 34*  GLUCOSE 76 127* 94  BUN 16 16 28*  CREATININE 0.75 0.78 0.82  CALCIUM 9.7 9.5 9.9  MG  --   --  2.3  PHOS  --   --  3.4   GFR: Estimated Creatinine Clearance: 26.5 mL/min (by C-G formula based on SCr of 0.82 mg/dL). Liver Function Tests: Recent Labs  Lab 11/02/18 1200 11/03/18 0431 11/04/18 0826  AST _0 ALT _1 ALKPHOS 75 76 87  BILITOT 0.5 0.4 0.4  PROT 7.1 6.7 7.1  ALBUMIN 3.7 3.4* 3.5   No results for input(s): LIPASE, AMYLASE in the last 168 hours. No results for input(s): AMMONIA in the last 168 hours. Coagulation Profile: No results for input(s): INR, PROTIME in the last 168 hours. Cardiac Enzymes: Recent Labs  Lab 11/02/18 1707 11/02/18 2217 11/03/18 1504  TROPONINI 0.11* 0.13* 0.08*   BNP (last 3 results) No results for input(s): PROBNP in the last 8760 hours. HbA1C: No results for  input(s): HGBA1C in the last 72 hours. CBG: No results for input(s): GLUCAP in the last 168 hours. Lipid Profile: No results for input(s): CHOL, HDL, LDLCALC, TRIG, CHOLHDL, LDLDIRECT in the last 72 hours. Thyroid Function Tests: No results for input(s): TSH, T4TOTAL, FREET4, T3FREE, THYROIDAB in the last 72 hours. Anemia Panel: No results for input(s): VITAMINB12, FOLATE, FERRITIN, TIBC, IRON, RETICCTPCT in the last 72 hours. Urine analysis:    Component Value Date/Time   COLORURINE COLORLESS (A) 11/02/2018 1533   APPEARANCEUR CLEAR 11/02/2018 1533   LABSPEC 1.004 (L) 11/02/2018 1533   LABSPEC 1.010 10/21/2015 1135   PHURINE 8.0 11/02/2018 1533   GLUCOSEU NEGATIVE 11/02/2018 1533   GLUCOSEU Negative 10/21/2015 1135   HGBUR NEGATIVE 11/02/2018 1533   BILIRUBINUR NEGATIVE 11/02/2018 1533   BILIRUBINUR Negative 10/21/2015 [place] 11/02/2018 1533   PROTEINUR NEGATIVE 11/02/2018 1533   UROBILINOGEN 0.2 10/21/2015 1135   NITRITE  NEGATIVE 11/02/2018 1533   LEUKOCYTESUR NEGATIVE 11/02/2018 1533   LEUKOCYTESUR Negative 10/21/2015 1135   Sepsis Labs: _0 (procalcitonin:4,lacticidven:4)  )No results found for this or any previous visit (from the past 240 hour(s)).    Studies: No results found.  Scheduled Meds: . amLODipine  5 mg Oral Daily  . aspirin EC  81 mg Oral Daily  . benzonatate  200 mg Oral TID  . carvedilol  25 mg Oral BID  . donepezil  5 mg Oral QHS  . enoxaparin (LOVENOX) injection  30 mg Subcutaneous Q24H  . feeding supplement (ENSURE ENLIVE)  237 mL Oral TID BM  . methylPREDNISolone (SOLU-MEDROL) injection  40 mg Intravenous Q12H  . multivitamin with minerals  1 tablet Oral Daily  . polyethylene glycol  17 g Oral BID  . senna-docusate  2 tablet Oral BID  . sodium chloride flush  3 mL Intravenous Q12H    Continuous Infusions: . sodium chloride       LOS: 2 days     Kayleen Memos, MD [facility] Pager [phone]  If 7PM-7AM, please contact night-coverage [email or web address] Password [facility] 11/04/2018, 5:03 PM

## 2018-11-04 NOTE — Clinical Social Work Note (Signed)
Clinical Social Work Assessment  Patient Details  Name: Pamela House MRN: [id number] Date of Birth: 02/21/1930  Date of referral:  11/04/18               Reason for consult:  Discharge Planning                Permission sought to share information with:  Family Supports Permission granted to share information::  Yes, Verbal Permission Granted  Name::     daughter [place], granddaughter Sabrina  Agency::  [facility]   Relationship::     Contact Information:     Housing/Transportation Living arrangements for the past 2 months:  Single Family Home Source of Information:  Patient, Adult Children, Medical Team, Palliative Care Team(granddaughter) Patient Interpreter Needed:  None Criminal Activity/Legal Involvement Pertinent to Current Situation/Hospitalization:  No - Comment as needed Significant Relationships:  Adult Children, Other Family Members Lives with:  Self Do you feel safe going back to the place where you live?  Yes Need for family participation in patient care:  Yes (Comment)(daughter and granddaughter involved)  Care giving concerns:  Pt admitted from home where she resides alone. Her family nearby is highly involved and supportive. She has dementia and per family has been forgetting to eat and in general starting to be unable to manage household (paying bills twice for example). Pt has lost significant amount of weight over past year per family. Pt has been able to ambulate independently "but I can't really go far." Pt has hx of breast cancer and is supposed to be taking oral chemo (Alsen) however she states she has not taken it since October or November 2019. States she had assistance program to afford it and program lapsed- granddaughter assisting in re-instating this as pt states she does want to continue treating cancer.  States pt also has pulmonary fibrosis and has been SOB for a few weeks. Admitted for respiratory failure, also failure to thrive.    Social Worker  assessment / plan:  CSW consulted to assist with disposition as pt lives alone and has been declining in health and increasing in care needs - see above. Met with pt, daughter, and granddaughter at bedside. Pt is alert and oriented however is forgetful of details and history (consistent with dementia at baseline per her family). States her goal has been to remain home as she enjoys living alone with her family nearby. She agrees that she has not been eating because "she forgets sometimes."   Family and pt have been working with PMT and pt with therapy during this stay and are interested in pt admitting to SNF for rehab. They state, "We know it's time to step in and guide her decisions and care. We respect her wishes and have let her stay independent as long as possible but we think that time is up." Family reports that they have decided they do want outpatient palliative care with [facility].   Pt has completed HCPOA paperwork naming daughter and granddaughter as agents, and during conversation with CSW today also admits she feels that granddaughter should be her financial POA as well as she is forgetful about her bills and finances. Pt owns her home and is prepared to spend her savings and possibly sell home to pay for her long term care in the future. States her hope is that she could progress in rehab and return home at least for a little while. Daughter states they are considering the possibility that pt may not return  home but instead stay long term at a SNF or move in to a ALF/memory care facility. Pt's granddaughter is dialysis clinic social worker in area, also volunteers for [facility],  and is very well-versed in elder-care resources and this process, she is assisting guiding pt's plans. CSW made referrals to area SNFs and will follow up with bed offers.   Employment status:  Retired Engineer, maintenance ([employer])) PT Recommendations:  [place] / Referral to community resources:  [place]  Patient/Family's Response to care:  Very appreciative and engaged  Patient/Family's Understanding of and Emotional Response to Diagnosis, Current Treatment, and Prognosis:  Pt was extremely pleasant and involved in conversation, somewhat resistant but eventually admitting that she needs help rather than remaining at home alone long term. Is aware that her memory has deficits and that she is not managing her health and affairs as well (see above). Very supported by family. Daughter and granddaughter are very good historians of pt's life and care/health issues, are engaged, reasonable, and gracious.   Emotional Assessment Appearance:  Appears stated age Attitude/Demeanor/Rapport:  Engaged Affect (typically observed):  Stable Orientation:  Oriented to Self, Oriented to Place, Oriented to  Time, Oriented to Situation Alcohol / Substance use:  Not Applicable Psych involvement (Current and /or in the community):  No (Comment)  Discharge Needs  Concerns to be addressed:  Discharge Planning Concerns, Decision making concerns Readmission within the last 30 days:  No Current discharge risk:  Cognitively Impaired, Dependent with Mobility Barriers to Discharge:  Continued Medical Work up, [employer], [place] 11/04/2018, 4:20 PM [phone]

## 2018-11-05 DIAGNOSIS — C9210 Chronic myeloid leukemia, BCR/ABL-positive, not having achieved remission: Secondary | ICD-10-CM

## 2018-11-05 LAB — BASIC METABOLIC PANEL
Anion gap: 9 (ref 5–15)
BUN: 35 mg/dL — ABNORMAL HIGH (ref 8–23)
CO2: 31 mmol/L (ref 22–32)
Calcium: 9.7 mg/dL (ref 8.9–10.3)
Chloride: 90 mmol/L — ABNORMAL LOW (ref 98–111)
Creatinine, Ser: 0.85 mg/dL (ref 0.44–1.00)
GFR calc Af Amer: >60 mL/min (ref >60)
GFR calc non Af Amer: >60 mL/min (ref >60)
Glucose, Bld: 116 mg/dL — ABNORMAL HIGH (ref 70–99)
Potassium: 4.9 mmol/L (ref 3.5–5.1)
Sodium: 130 mmol/L — ABNORMAL LOW (ref 135–145)

## 2018-11-05 NOTE — Progress Notes (Signed)
NAME:  Pamela House, MRN:  [id number], DOB:  [birth year], LOS: 3 ADMISSION DATE:  11/02/2018, CONSULTATION DATE: 11/04/2018 REFERRING MD: Dr. Nevada Crane, CHIEF COMPLAINT: Shortness of breath  Brief History   85 year old lady with medical history significant for pulmonary fibrosis history of dementia, CML left breast cancer, cardiomyopathy Progressive shortness of breath She does have a cough, minimal sputum production  Feels a little bit better today  Past Medical History   Past Medical History:  Diagnosis Date  . Allergic rhinitis   . Anxiety   . Breast cancer ([place]) 2016   left breast  . DDD (degenerative disc disease)   . Dementia ([place])   . Dyslipidemia   . GERD (gastroesophageal reflux disease)   . Hypertension   . Leukemia ([place]) 2010  . Osteoporosis   . Personal history of chemotherapy 2016  . Personal history of radiation therapy 2016  . Radiation 04/30/15-05/1515   Left breast  . Vitamin D deficiency    Significant Hospital Events     Consults:  PCCM 11/04/2018  Procedures:    Significant Diagnostic Tests:  Chest x-ray with no acute infiltrate, consistent with fibrosis  Micro Data:  None  Antimicrobials:  None  Interim history/subjective:  Shortness of breath appears to be stabilizing She has a chronic cough Does not feel well globally  Objective   Blood pressure 112/60, pulse 69, temperature 98 F (36.7 C), temperature source Oral, resp. rate 16, height 5' 3" (1.6 m), weight 33.2 kg, SpO2 100 %.        Intake/Output Summary (Last 24 hours) at 11/05/2018 1007 Last data filed at 11/04/2018 2038 Gross per 24 hour  Intake 3 ml  Output -  Net 3 ml   Filed Weights   11/03/18 0551 11/04/18 0432 11/05/18 0500  Weight: 37.7 kg 34.1 kg 33.2 kg    Examination: General: Elderly lady, frail, chronically ill looking HENT: Moist oral mucosa Lungs: Bibasal rales Cardiovascular: S1-S2 appreciated Abdomen: Bowel sounds appreciated Extremities: No  edema Neuro: Alert and oriented x3  Resolved Hospital Problem list     Assessment & Plan:  Shortness of breath -Multifactorial relating to pulmonary fibrosis, right failure -Severe pulmonary hypertension  -Continue oxygen supplementation -Bronchodilators if wheezing -Short course of steroids  Pulmonary fibrosis -Progressive disease  History of CML -On Gleevec  History of breast cancer  Severe pulmonary hypertension -Relating to chronic lung disease  Chronic cough -On multiple agents -Optimized treatment   Best practice:  Diet: Heart healthy DVT prophylaxis: Enoxaparin Mobility: Out of bed as tolerated Code Status: DNR Family Communication: Discussed with daughter at bedside Disposition: Continue management of medical floor  Labs   CBC: Recent Labs  Lab 11/02/18 1200 11/03/18 0431 11/04/18 0826  WBC 2.9* 2.5* 5.8  NEUTROABS 1.3*  --  4.8  HGB 13.0 13.4 14.0  HCT 41.0 41.5 43.5  MCV 92.6 89.8 91.0  PLT 331 329 223    Basic Metabolic Panel: Recent Labs  Lab 11/02/18 1200 11/03/18 0431 11/04/18 0826 11/05/18 0627  NA 126* 124* 127* 130*  K 4.9 4.7 4.9 4.9  CL 88* 85* 85* 90*  CO2 31 30 34* 31  GLUCOSE 76 127* 94 116*  BUN 16 16 28* 35*  CREATININE 0.75 0.78 0.82 0.85  CALCIUM 9.7 9.5 9.9 9.7  MG  --   --  2.3  --   PHOS  --   --  3.4  --    GFR: Estimated Creatinine Clearance: 24.9 mL/min (by C-G formula  based on SCr of 0.85 mg/dL). Recent Labs  Lab 11/02/18 1200 11/03/18 0431 11/04/18 0826  WBC 2.9* 2.5* 5.8    Liver Function Tests: Recent Labs  Lab 11/02/18 1200 11/03/18 0431 11/04/18 0826  AST _0 ALT _1 ALKPHOS 75 76 87  BILITOT 0.5 0.4 0.4  PROT 7.1 6.7 7.1  ALBUMIN 3.7 3.4* 3.5   No results for input(s): LIPASE, AMYLASE in the last 168 hours. No results for input(s): AMMONIA in the last 168 hours.  ABG    Component Value Date/Time   TCO2 22 02/26/2015 1231     Coagulation Profile: No results for  input(s): INR, PROTIME in the last 168 hours.  Cardiac Enzymes: Recent Labs  Lab 11/02/18 1707 11/02/18 2217 11/03/18 1504  TROPONINI 0.11* 0.13* 0.08*    HbA1C: No results found for: HGBA1C  CBG: No results for input(s): GLUCAP in the last 168 hours.  Review of Systems:   Review of Systems  Constitutional: Negative.   HENT: Negative.   Respiratory: Positive for cough and shortness of breath.   Cardiovascular: Negative.   Skin: Negative.   All other systems reviewed and are negative.  Past Medical History  She,  has a past medical history of Allergic rhinitis, Anxiety, Breast cancer ([place]) (2016), DDD (degenerative disc disease), Dementia ([place]), Dyslipidemia, GERD (gastroesophageal reflux disease), Hypertension, Leukemia ([place]) (2010), Osteoporosis, Personal history of chemotherapy (2016), Personal history of radiation therapy (2016), Radiation (04/30/15-05/1515), and Vitamin D deficiency.   Surgical History    Past Surgical History:  Procedure Laterality Date  . BACK SURGERY  1984  . BREAST LUMPECTOMY Left 2016  . BREAST LUMPECTOMY WITH RADIOACTIVE SEED AND SENTINEL LYMPH NODE BIOPSY Left 02/27/2015   Procedure: LEFT BREAST LUMPECTOMY WITH RADIOACTIVE SEED AND SENTINEL LYMPH NODE BIOPSY;  Surgeon: Autumn Messing III, MD;  Location: [facility];  Service: General;  Laterality: Left;     Social History   reports that she has never smoked. She has never used smokeless tobacco. She reports that she does not drink alcohol or use drugs.   Family History   Her family history is not on file.

## 2018-11-05 NOTE — Progress Notes (Signed)
PROGRESS NOTE  Pamela House OYD:[id number] DOB: 1930/06/15 DOA: 11/02/2018 PCP: Wenda Low, MD  HPI/Recap of past 24 hours: Pamela House is a 85 y.o. female with a past medical history of breast cancer, dementia, CML, interstitial lung disease, who lives by herself.  She was brought in by EMS who was called by family members due to concern for progressive shortness of breath over the past week or so.  Patient is a very poor historian due to her dementia.  Apparently she has been getting progressively short of breath over the past many days.  She would get short of breath while talking to family members over the phone.  When family member went to check on the patient today she was noted to get extremely short winded and almost collapsed on the couch.  Oxygen saturations when checked by EMS was about 90%.  She was put on 2 L.  She was brought into the hospital.  History is very limited from this patient.  Patient's daughter was at the bedside.  Patient denies any chest pain.  No nausea vomiting.  Some lower extremity swelling but not significantly so.  Denies any cough fever.  No dizziness or lightheadedness.  In the emergency department x-ray showed chronic changes of lung disease.  Her BNP was noted to be mildly elevated.  ED provider was concerned about her lower extremity edema.  She was given a dose of Lasix.  Patient continues to be short of breath even with minimal exertion.  Admitted for acute hypoxic respiratory failure.  Pulmonology consulted and following.  Hospital course complicated by significant generalized weakness in the setting of failure to thrive.  PT recommended SNF.  11/05/18: Patient seen and examined with her daughter at bedside.  No acute events overnight.  Reports pleuritic pain worse when she coughs.  Still significantly weak in the setting of failure to thrive in an adult.  Encouraged to increase oral protein calorie intake.   Assessment/Plan: Principal Problem:   Dyspnea Active Problems:   CML (chronic myelocytic leukemia) (HCC)   Hypertension   ILD (interstitial lung disease) ([place])   Malignant neoplasm of upper-outer quadrant of left breast in female, estrogen receptor negative ([place])   Hyponatremia   IPF (idiopathic pulmonary fibrosis) ([place])   Palliative care encounter   DNR (do not resuscitate)  Acute hypoxic respiratory failure suspect secondary to idiopathic pulmonary fibrosis Continue IV Solu-Medrol Continue nebs Has not seen a pulmonologist in recent years Pulmonology consulted.  Highly appreciated Maintain O2 saturation greater than 92%  Idiopathic pulmonary fibrosis Not followed by pulmonology outpatient Management as recommended by pulmonology  Severe pulmonary hypertension secondary to chronic lung disease/idiopathic pulmonary fibrosis Latest 2D echo done on 11/03/2018 Management as stated above  Failure to thrive in an adult Appears severely emaciated BMI 12 Continue oral supplement Continue to encourage increasing oral protein calorie intake  Resolving hypovolemic hyponatremia  Presented with sodium level of 124 on 220 1:24 AM Sodium level 130 on 11/05/2018 Continue IV fluids 75 cc/h normal saline Continue to monitor urine output  Resolved leukopenia   Mildly elevated troponin, suspect demand mismatch Peaked at 0.13 and trended down to 0.08 Reports pleuritic chest pain worse when she coughs If third set of troponin continues to increase consult cardiology Independently reviewed twelve-lead EKG done on 11/03/2018 which revealed sinus rhythm with no specific ST-T changes  Chronic diastolic CHF No sign of acute exacerbation 2D echo done on 11/03/2018 revealed LVEF 55 to 60% Severe pulmonary hypertension  Severe protein calorie malnutrition/failure to thrive in adult Evidenced by loss of muscle mass BMI 12 Encourage increasing p.o. calorie intake  History of breast cancer: She has followed by medical oncology, Dr.  Jana Hakim.  Has previously received radiation treatment.  Not noted to be on any active treatment currently.    History of CML: Followed by oncology.  As per the last note from October she is supposed to be on [place].  Essential hypertension: Blood pressure noted to be elevated.  Resume her antihypertensive medications.  Ambulatory dysfunction PT assessed and recommended SNF Fall precautions Continue PT  Goals of care Palliative care team consulted to establish goals of care DNR as of 11/03/2018    DVT Prophylaxis: Lovenox Code Status:  DNR Family Communication:  Updated daughter at bedside on 11/05/2018 Disposition:  SNF when hemodynamically stable possibly tomorrow 11/06/2018 Consults called:  Palliative care team, pulmonology   Objective: Vitals:   11/05/18 0500 11/05/18 0537 11/05/18 0932 11/05/18 0940  BP:  112/62 112/60   Pulse:  (!) 56 69   Resp:  16    Temp:  97.7 F (36.5 C)  98 F (36.7 C)  TempSrc:  Oral  Oral  SpO2:  100%    Weight: 33.2 kg     Height:        Intake/Output Summary (Last 24 hours) at 11/05/2018 1250 Last data filed at 11/04/2018 2038 Gross per 24 hour  Intake 3 ml  Output -  Net 3 ml   Filed Weights   11/03/18 0551 11/04/18 0432 11/05/18 0500  Weight: 37.7 kg 34.1 kg 33.2 kg    Exam:  . General: 85 y.o. year-old female severely emaciated in no acute distress.  Alert in the setting of advanced dementia. . Cardiovascular: Regular rate and rhythm with no rubs or gallops.  No JVD or thyromegaly. Marland Kitchen Respiratory: Mild rales at bases with no wheezes.  Poor inspiratory effort. . Abdomen: Soft nontender nondistended with normal bowel sounds x4 quadrants. . Musculoskeletal: No lower extremity edema. 2/4 pulses in all 4 extremities. Marland Kitchen Psychiatry: Mood is appropriate for condition and setting   Data Reviewed: CBC: Recent Labs  Lab 11/02/18 1200 11/03/18 0431 11/04/18 0826  WBC 2.9* 2.5* 5.8  NEUTROABS 1.3*  --  4.8  HGB 13.0 13.4 14.0    HCT 41.0 41.5 43.5  MCV 92.6 89.8 91.0  PLT 331 329 735   Basic Metabolic Panel: Recent Labs  Lab 11/02/18 1200 11/03/18 0431 11/04/18 0826 11/05/18 0627  NA 126* 124* 127* 130*  K 4.9 4.7 4.9 4.9  CL 88* 85* 85* 90*  CO2 31 30 34* 31  GLUCOSE 76 127* 94 116*  BUN 16 16 28* 35*  CREATININE 0.75 0.78 0.82 0.85  CALCIUM 9.7 9.5 9.9 9.7  MG  --   --  2.3  --   PHOS  --   --  3.4  --    GFR: Estimated Creatinine Clearance: 24.9 mL/min (by C-G formula based on SCr of 0.85 mg/dL). Liver Function Tests: Recent Labs  Lab 11/02/18 1200 11/03/18 0431 11/04/18 0826  AST _0 ALT _1 ALKPHOS 75 76 87  BILITOT 0.5 0.4 0.4  PROT 7.1 6.7 7.1  ALBUMIN 3.7 3.4* 3.5   No results for input(s): LIPASE, AMYLASE in the last 168 hours. No results for input(s): AMMONIA in the last 168 hours. Coagulation Profile: No results for input(s): INR, PROTIME in the last 168 hours. Cardiac Enzymes: Recent Labs  Lab 11/02/18 1707 11/02/18 2217 11/03/18 1504  TROPONINI 0.11* 0.13* 0.08*   BNP (last 3 results) No results for input(s): PROBNP in the last 8760 hours. HbA1C: No results for input(s): HGBA1C in the last 72 hours. CBG: No results for input(s): GLUCAP in the last 168 hours. Lipid Profile: No results for input(s): CHOL, HDL, LDLCALC, TRIG, CHOLHDL, LDLDIRECT in the last 72 hours. Thyroid Function Tests: No results for input(s): TSH, T4TOTAL, FREET4, T3FREE, THYROIDAB in the last 72 hours. Anemia Panel: No results for input(s): VITAMINB12, FOLATE, FERRITIN, TIBC, IRON, RETICCTPCT in the last 72 hours. Urine analysis:    Component Value Date/Time   COLORURINE COLORLESS (A) 11/02/2018 1533   APPEARANCEUR CLEAR 11/02/2018 1533   LABSPEC 1.004 (L) 11/02/2018 1533   LABSPEC 1.010 10/21/2015 1135   PHURINE 8.0 11/02/2018 1533   GLUCOSEU NEGATIVE 11/02/2018 1533   GLUCOSEU Negative 10/21/2015 1135   HGBUR NEGATIVE 11/02/2018 1533   BILIRUBINUR NEGATIVE 11/02/2018 1533    BILIRUBINUR Negative 10/21/2015 [place] 11/02/2018 1533   PROTEINUR NEGATIVE 11/02/2018 1533   UROBILINOGEN 0.2 10/21/2015 1135   NITRITE NEGATIVE 11/02/2018 1533   LEUKOCYTESUR NEGATIVE 11/02/2018 1533   LEUKOCYTESUR Negative 10/21/2015 1135   Sepsis Labs: _0 (procalcitonin:4,lacticidven:4)  )No results found for this or any previous visit (from the past 240 hour(s)).    Studies: No results found.  Scheduled Meds: . amLODipine  5 mg Oral Daily  . aspirin EC  81 mg Oral Daily  . benzonatate  200 mg Oral TID  . carvedilol  25 mg Oral BID  . donepezil  5 mg Oral QHS  . enoxaparin (LOVENOX) injection  30 mg Subcutaneous Q24H  . feeding supplement (ENSURE ENLIVE)  237 mL Oral TID BM  . methylPREDNISolone (SOLU-MEDROL) injection  40 mg Intravenous Q12H  . multivitamin with minerals  1 tablet Oral Daily  . polyethylene glycol  17 g Oral BID  . senna-docusate  2 tablet Oral BID  . sodium chloride flush  3 mL Intravenous Q12H    Continuous Infusions: . sodium chloride       LOS: 3 days     Kayleen Memos, MD [facility] Pager [phone]  If 7PM-7AM, please contact night-coverage [email or web address] Password [facility] 11/05/2018, 12:50 PM

## 2018-11-06 ENCOUNTER — Inpatient Hospital Stay (HOSPITAL_COMMUNITY): Payer: Medicare Other

## 2018-11-06 DIAGNOSIS — E43 Unspecified severe protein-calorie malnutrition: Secondary | ICD-10-CM

## 2018-11-06 LAB — BASIC METABOLIC PANEL
Anion gap: 9 (ref 5–15)
BUN: 41 mg/dL — ABNORMAL HIGH (ref 8–23)
CO2: 35 mmol/L — ABNORMAL HIGH (ref 22–32)
Calcium: 9.6 mg/dL (ref 8.9–10.3)
Chloride: 88 mmol/L — ABNORMAL LOW (ref 98–111)
Creatinine, Ser: 0.91 mg/dL (ref 0.44–1.00)
GFR, EST NON AFRICAN AMERICAN: 57 mL/min — AB (ref >60)
Glucose, Bld: 151 mg/dL — ABNORMAL HIGH (ref 70–99)
Potassium: 4.7 mmol/L (ref 3.5–5.1)
Sodium: 132 mmol/L — ABNORMAL LOW (ref 135–145)

## 2018-11-06 MED ORDER — SODIUM CHLORIDE (PF) 0.9 % IJ SOLN
INTRAMUSCULAR | Status: AC
  Filled 2018-11-06: qty 50

## 2018-11-06 MED ORDER — PREDNISONE 20 MG PO TABS
40.0000 mg | ORAL_TABLET | Freq: Every day | ORAL | Status: DC
  Administered 2018-11-07 – 2018-11-08 (×2): 40 mg via ORAL
  Filled 2018-11-06 (×2): qty 2

## 2018-11-06 MED ORDER — IOHEXOL 300 MG/ML  SOLN
75.0000 mL | Freq: Once | INTRAMUSCULAR | Status: AC | PRN
  Administered 2018-11-06: 75 mL via INTRAVENOUS

## 2018-11-06 MED ORDER — SODIUM CHLORIDE 0.9 % IV SOLN
250.0000 mL | INTRAVENOUS | Status: DC | PRN
  Administered 2018-11-06 – 2018-11-08 (×2): 250 mL via INTRAVENOUS

## 2018-11-06 MED ORDER — PANTOPRAZOLE SODIUM 40 MG PO TBEC
40.0000 mg | DELAYED_RELEASE_TABLET | Freq: Two times a day (BID) | ORAL | Status: DC
  Administered 2018-11-06 – 2018-11-08 (×5): 40 mg via ORAL
  Filled 2018-11-06 (×5): qty 1

## 2018-11-06 NOTE — Progress Notes (Signed)
PROGRESS NOTE  Pamela House TGG:[id number] DOB: July 26, 1930 DOA: 11/02/2018 PCP: Wenda Low, MD  HPI/Recap of past 24 hours: Pamela House is a 85 y.o. female with a past medical history of breast cancer, dementia, CML, interstitial lung disease, who lives by herself.  She was brought in by EMS who was called by family members due to concern for progressive shortness of breath over the past week or so.  Patient is a very poor historian due to her dementia.  Apparently she has been getting progressively short of breath over the past many days.  She would get short of breath while talking to family members over the phone.  When family member went to check on the patient today she was noted to get extremely short winded and almost collapsed on the couch.  Oxygen saturations when checked by EMS was about 90%.  She was put on 2 L.  She was brought into the hospital.  History is very limited from this patient.  Patient's daughter was at the bedside.  Patient denies any chest pain.  No nausea vomiting.  Some lower extremity swelling but not significantly so.  Denies any cough fever.  No dizziness or lightheadedness.  In the emergency department x-ray showed chronic changes of lung disease.  Her BNP was noted to be mildly elevated.  ED provider was concerned about her lower extremity edema.  She was given a dose of Lasix.  Patient continues to be short of breath even with minimal exertion.  Admitted for acute hypoxic respiratory failure.  Pulmonology consulted and following.  Hospital course complicated by significant generalized weakness in the setting of failure to thrive.  PT recommended SNF.  11/05/18: Patient seen and examined with her daughter at bedside.  No acute events overnight.  Reports pleuritic pain worse when she coughs.  Still significantly weak in the setting of failure to thrive in an adult.  Encouraged to increase oral protein calorie intake.  11/06/18: Seen and examined with her  daughter at her bedside. Persistent conversational dyspnea. Discussed with Dr Jana Hakim, ok to stop gleevec. CML stable. No new complaints.   Assessment/Plan: Principal Problem:   Dyspnea Active Problems:   CML (chronic myelocytic leukemia) (HCC)   Hypertension   ILD (interstitial lung disease) ([place])   Malignant neoplasm of upper-outer quadrant of left breast in female, estrogen receptor negative ([place])   Hyponatremia   IPF (idiopathic pulmonary fibrosis) ([place])   Palliative care encounter   DNR (do not resuscitate)   Protein-calorie malnutrition, severe  Acute hypoxic respiratory failure suspect secondary to idiopathic pulmonary fibrosis/interstitial lung disease -Management as recommended by pulmonology Maintain O2 saturation greater than 92% DME oxygen 2 L continuously for comfort/shortness of breath Continue to monitor O2 saturation Last CT chest was in 2017, repeat CT chest with contrast to assess the severity of ILD  Idiopathic pulmonary fibrosis/interstitial lung disease Management as recommended by pulmonology  Severe pulmonary hypertension secondary to chronic lung disease/idiopathic pulmonary fibrosis Latest 2D echo done on 11/03/2018 Management as stated above  Failure to thrive in an adult Appears severely emaciated BMI 12 Continue oral supplement Continue to encourage increasing oral protein calorie intake  Resolving hypovolemic hyponatremia  Presented with sodium level of 124 on 220 1:24 AM Sodium level 130 on 11/05/2018 Decrease rate of IV fluid as recommended by pulmonology Continue to monitor urine output Obtain BMP  Resolved leukopenia   Mildly elevated troponin, suspect demand mismatch Peaked at 0.13 and trended down to 0.08 Reports pleuritic  chest pain worse when she coughs If third set of troponin continues to increase consult cardiology Independently reviewed twelve-lead EKG done on 11/03/2018 which revealed sinus rhythm with no specific ST-T  changes  Chronic diastolic CHF No sign of acute exacerbation 2D echo done on 11/03/2018 revealed LVEF 55 to 60% Severe pulmonary hypertension  Severe protein calorie malnutrition/failure to thrive in adult Evidenced by loss of muscle mass BMI 12 Encourage increasing p.o. calorie intake  History of breast cancer: She has followed by medical oncology, Dr. Jana Hakim.  Has previously received radiation treatment.  Not noted to be on any active treatment currently.    History of CML: Followed by oncology.  As per the last note from October she is supposed to be on [place].  Discussed with Dr. Jana Hakim, okay to hold off [place].  No current issues with CML.  Essential hypertension: Blood pressure noted to be elevated.  Resume her antihypertensive medications.  Ambulatory dysfunction PT assessed and recommended SNF Fall precautions Continue PT Awaiting placement to SNF  Goals of care Palliative care team consulted to establish goals of care DNR as of 11/03/2018    DVT Prophylaxis: Lovenox Code Status:  DNR Family Communication:  Updated daughter at bedside on 11/05/2018 Disposition:  SNF when hemodynamically stable possibly tomorrow 11/07/2018 Consults called:  Palliative care team, pulmonology   Objective: Vitals:   11/05/18 2104 11/06/18 0517 11/06/18 0613 11/06/18 0851  BP: 113/70 113/62  (!) 109/57  Pulse: 64 (!) 57  61  Resp: 16 19    Temp: 97.6 F (36.4 C) 98.8 F (37.1 C)    TempSrc: Oral Oral    SpO2: 97% 100%    Weight:   39.8 kg   Height:        Intake/Output Summary (Last 24 hours) at 11/06/2018 1327 Last data filed at 11/06/2018 0600 Gross per 24 hour  Intake 60 ml  Output -  Net 60 ml   Filed Weights   11/04/18 0432 11/05/18 0500 11/06/18 1505  Weight: 34.1 kg 33.2 kg 39.8 kg    Exam:  . General: 85 y.o. year-old female daily MS seated in no acute distress.  Alert the setting of advanced dementia . Cardiovascular: Regular rate and rhythm with no  rubs or gallops.  No JVD or thyromegaly . Respiratory: Mild rales at bases with no wheezes.  Poor inspiratory effort. . Abdomen: Soft nontender nondistended with normal bowel sounds x4 quadrants. . Musculoskeletal: No lower extremity edema. 2/4 pulses in all 4 extremities. Marland Kitchen Psychiatry: Mood is appropriate for condition and setting   Data Reviewed: CBC: Recent Labs  Lab 11/02/18 1200 11/03/18 0431 11/04/18 0826  WBC 2.9* 2.5* 5.8  NEUTROABS 1.3*  --  4.8  HGB 13.0 13.4 14.0  HCT 41.0 41.5 43.5  MCV 92.6 89.8 91.0  PLT 331 329 697   Basic Metabolic Panel: Recent Labs  Lab 11/02/18 1200 11/03/18 0431 11/04/18 0826 11/05/18 0627  NA 126* 124* 127* 130*  K 4.9 4.7 4.9 4.9  CL 88* 85* 85* 90*  CO2 31 30 34* 31  GLUCOSE 76 127* 94 116*  BUN 16 16 28* 35*  CREATININE 0.75 0.78 0.82 0.85  CALCIUM 9.7 9.5 9.9 9.7  MG  --   --  2.3  --   PHOS  --   --  3.4  --    GFR: Estimated Creatinine Clearance: 29.9 mL/min (by C-G formula based on SCr of 0.85 mg/dL). Liver Function Tests: Recent Labs  Lab 11/02/18 1200  11/03/18 0431 11/04/18 0826  AST _0 ALT _1 ALKPHOS 75 76 87  BILITOT 0.5 0.4 0.4  PROT 7.1 6.7 7.1  ALBUMIN 3.7 3.4* 3.5   No results for input(s): LIPASE, AMYLASE in the last 168 hours. No results for input(s): AMMONIA in the last 168 hours. Coagulation Profile: No results for input(s): INR, PROTIME in the last 168 hours. Cardiac Enzymes: Recent Labs  Lab 11/02/18 1707 11/02/18 2217 11/03/18 1504  TROPONINI 0.11* 0.13* 0.08*   BNP (last 3 results) No results for input(s): PROBNP in the last 8760 hours. HbA1C: No results for input(s): HGBA1C in the last 72 hours. CBG: No results for input(s): GLUCAP in the last 168 hours. Lipid Profile: No results for input(s): CHOL, HDL, LDLCALC, TRIG, CHOLHDL, LDLDIRECT in the last 72 hours. Thyroid Function Tests: No results for input(s): TSH, T4TOTAL, FREET4, T3FREE, THYROIDAB in the last 72  hours. Anemia Panel: No results for input(s): VITAMINB12, FOLATE, FERRITIN, TIBC, IRON, RETICCTPCT in the last 72 hours. Urine analysis:    Component Value Date/Time   COLORURINE COLORLESS (A) 11/02/2018 1533   APPEARANCEUR CLEAR 11/02/2018 1533   LABSPEC 1.004 (L) 11/02/2018 1533   LABSPEC 1.010 10/21/2015 1135   PHURINE 8.0 11/02/2018 1533   GLUCOSEU NEGATIVE 11/02/2018 1533   GLUCOSEU Negative 10/21/2015 1135   HGBUR NEGATIVE 11/02/2018 1533   BILIRUBINUR NEGATIVE 11/02/2018 1533   BILIRUBINUR Negative 10/21/2015 [place] 11/02/2018 1533   PROTEINUR NEGATIVE 11/02/2018 1533   UROBILINOGEN 0.2 10/21/2015 1135   NITRITE NEGATIVE 11/02/2018 1533   LEUKOCYTESUR NEGATIVE 11/02/2018 1533   LEUKOCYTESUR Negative 10/21/2015 1135   Sepsis Labs: _2 (procalcitonin:4,lacticidven:4)  )No results found for this or any previous visit (from the past 240 hour(s)).    Studies: No results found.  Scheduled Meds: . amLODipine  5 mg Oral Daily  . aspirin EC  81 mg Oral Daily  . benzonatate  200 mg Oral TID  . carvedilol  25 mg Oral BID  . donepezil  5 mg Oral QHS  . enoxaparin (LOVENOX) injection  30 mg Subcutaneous Q24H  . feeding supplement (ENSURE ENLIVE)  237 mL Oral TID BM  . multivitamin with minerals  1 tablet Oral Daily  . pantoprazole  40 mg Oral BID  . polyethylene glycol  17 g Oral BID  . [START ON 11/07/2018] predniSONE  40 mg Oral Q breakfast  . senna-docusate  2 tablet Oral BID  . sodium chloride flush  3 mL Intravenous Q12H    Continuous Infusions: . sodium chloride       LOS: 4 days     Kayleen Memos, MD [facility] Pager [phone]  If 7PM-7AM, please contact night-coverage [email or web address] Password [email or web address] 11/06/2018, 1:27 PM

## 2018-11-06 NOTE — Progress Notes (Signed)
Assumed care of pt from previous nurse, agree with previous nurses assessment. Will continue to monitor.

## 2018-11-06 NOTE — Progress Notes (Signed)
SATURATION QUALIFICATIONS: (This note is used to comply with regulatory documentation for home oxygen)  Patient Saturations on Room Air at Rest = 85%  Pt oxygen saturation dropped to 85% while eating in bed with [place] removed. 2L [place] placed back on patient to bring O2 saturation up to 95%. Ambulatory saturation not attempted at this time. Will continue to monitor closely.

## 2018-11-06 NOTE — Progress Notes (Signed)
Chaplain responding to spiritual care consult re: advance directives.    Pamela House has history of Dementia.  During this chaplain consult, Pamela House was able to orient to person, place and time.  She was oriented to situation, but could be confused by the [facility] document. She was able to consistently state that she wishes Pamela House, and Pamela House to serve as her health care decision makers if she is not able to care for herself.  Pamela House demonstrated this in front of two witnesses.    Chaplain notarized HCPOA.  Copies and original with family.  Copy placed in chart.      Jerene Pitch, MDiv, [facility]

## 2018-11-06 NOTE — Progress Notes (Signed)
   NAME:  Pamela House, MRN:  [id number], DOB:  [birth year], LOS: 4 ADMISSION DATE:  11/02/2018, CONSULTATION DATE: 11/04/2018 REFERRING MD: Dr. [place] Crane, CHIEF COMPLAINT: Shortness of breath  Brief History   85 year old lady with medical history significant for pulmonary fibrosis history of dementia, CML left breast cancer, cardiomyopathy Progressive shortness of breath She does have a cough, minimal sputum production   Past Medical History  Dementia, degenerative disc disease, left breast cancer, chemotherapy, radiation therapy, vitamin D deficiency, anxiety  Significant Hospital Events     Consults:  PCCM 11/04/2018  Procedures:    Significant Diagnostic Tests:  Chest x-ray with no acute infiltrate, consistent with fibrosis  Micro Data:  None  Antimicrobials:  None  Interim history/subjective:  She feels better Objective   Blood pressure (Abnormal) 109/57, pulse 61, temperature 98.8 F (37.1 C), temperature source Oral, resp. rate 19, height _0  (1.6 m), weight 39.8 kg, SpO2 100 %.        Intake/Output Summary (Last 24 hours) at 11/06/2018 1101 Last data filed at 11/06/2018 0600 Gross per 24 hour  Intake 60 ml  Output no documentation  Net 60 ml   Filed Weights   11/04/18 0432 11/05/18 0500 11/06/18 9390  Weight: 34.1 kg 33.2 kg 39.8 kg    Examination: General this is a frail but very pleasant 85 year old female she is sitting up in bed conversing with her family she is in no acute distress this morning HEENT: Normocephalic atraumatic does have some temporal wasting there is no jugular venous distention her mucous membranes are moist her phonation quality is strong Pulmonary: Dry crackles both bases no accessory use Cardiac: Regular rate and rhythm Abdomen: Soft not tender no organomegaly Extremities: Warm and dry no edema Neuro: Awake oriented confused at times but easily reoriented and redirectable GU: [facility] Problem list     Assessment  & Plan:  Shortness of breath Multifactorial relating to pulmonary fibrosis, right failure, and severe pulmonary hypertension and cor pulmonale -Concerned that her worsening pulmonary fibrosis may be related to her [place] superimposed on possibly prior radiation related injury; she also has a history of hiatal hernia which would predispose her to reflux and also aspiration. Plan Continue supplemental oxygen, I think she will need this at discharge I think we should KVO IV fluids at this point Taper steroids, will transition to 40 mg of prednisone  Bronchodilators PRN Would continue to hold [place] she has not received this since October Reflux precautions, I have reviewed these with her family Add back PPI twice daily She is not a candidate for bronchoscopy  history of CML history of breast cancer Plan  Follow-up outpatient oncology May need to consider stopping Gleevec indefinitely  chronic cough Plan Continuing cough suppression Will empirically add PPI  Hypovolemic hyponatremia.  Has improved with sodium chloride Plan We will KVO as mentioned above    Best practice:  Diet: Heart healthy DVT prophylaxis: Enoxaparin Mobility: Out of bed as tolerated Code Status: DNR Family Communication: Discussed with daughter at bedside Disposition: Continue management of medical floor  Erick Colace ACNP-BC [place] Pager # [phone] OR # [phone] if no answer

## 2018-11-07 DIAGNOSIS — R0609 Other forms of dyspnea: Secondary | ICD-10-CM

## 2018-11-07 DIAGNOSIS — R05 Cough: Secondary | ICD-10-CM

## 2018-11-07 DIAGNOSIS — J8489 Other specified interstitial pulmonary diseases: Secondary | ICD-10-CM

## 2018-11-07 DIAGNOSIS — R059 Cough, unspecified: Secondary | ICD-10-CM

## 2018-11-07 LAB — BASIC METABOLIC PANEL
Anion gap: 5 (ref 5–15)
BUN: 31 mg/dL — ABNORMAL HIGH (ref 8–23)
CO2: 36 mmol/L — ABNORMAL HIGH (ref 22–32)
Calcium: 9.3 mg/dL (ref 8.9–10.3)
Chloride: 92 mmol/L — ABNORMAL LOW (ref 98–111)
Creatinine, Ser: 0.81 mg/dL (ref 0.44–1.00)
GFR calc Af Amer: >60 mL/min (ref >60)
GFR calc non Af Amer: >60 mL/min (ref >60)
Glucose, Bld: 87 mg/dL (ref 70–99)
Potassium: 4.2 mmol/L (ref 3.5–5.1)
Sodium: 133 mmol/L — ABNORMAL LOW (ref 135–145)

## 2018-11-07 LAB — PROCALCITONIN: Procalcitonin: <0.1 ng/mL

## 2018-11-07 LAB — CBC WITH DIFFERENTIAL/PLATELET
Abs Immature Granulocytes: 0.02 10*3/uL (ref 0.00–0.07)
Basophils Absolute: 0 10*3/uL (ref 0.0–0.1)
Basophils Relative: 0 %
Eosinophils Absolute: 0 10*3/uL (ref 0.0–0.5)
Eosinophils Relative: 0 %
HCT: 39.1 % (ref 36.0–46.0)
Hemoglobin: 11.6 g/dL — ABNORMAL LOW (ref 12.0–15.0)
Immature Granulocytes: 0 %
Lymphocytes Relative: 14 %
Lymphs Abs: 0.7 10*3/uL (ref 0.7–4.0)
MCH: 29 pg (ref 26.0–34.0)
MCHC: 29.7 g/dL — ABNORMAL LOW (ref 30.0–36.0)
MCV: 97.8 fL (ref 80.0–100.0)
Monocytes Absolute: 0.5 10*3/uL (ref 0.1–1.0)
Monocytes Relative: 10 %
Neutro Abs: 3.7 10*3/uL (ref 1.7–7.7)
Neutrophils Relative %: 76 %
PLATELETS: 292 10*3/uL (ref 150–400)
RBC: 4 MIL/uL (ref 3.87–5.11)
RDW: 13.6 % (ref 11.5–15.5)
WBC: 4.9 10*3/uL (ref 4.0–10.5)
nRBC: 0 % (ref 0.0–0.2)

## 2018-11-07 LAB — LACTIC ACID, PLASMA: LACTIC ACID, VENOUS: 1.1 mmol/L (ref 0.5–1.9)

## 2018-11-07 MED ORDER — HYDROCOD POLST-CPM POLST ER 10-8 MG/5ML PO SUER: 5.0000 mL | Freq: Every evening | ORAL | Status: DC | PRN

## 2018-11-07 MED ORDER — SODIUM CHLORIDE 0.9 % IV SOLN
500.0000 mg | Freq: Every day | INTRAVENOUS | Status: DC
  Administered 2018-11-07 – 2018-11-08 (×2): 500 mg via INTRAVENOUS
  Filled 2018-11-07 (×3): qty 500

## 2018-11-07 MED ORDER — HYDROCOD POLST-CPM POLST ER 10-8 MG/5ML PO SUER: 5.0000 mL | Freq: Every day | ORAL | Status: DC | PRN

## 2018-11-07 NOTE — Care Management Important Message (Signed)
Important Message  Patient Details  Name: Pamela House MRN: [id number] Date of Birth: 05-Jul-1930   Medicare Important Message Given:  Yes    Kerin Salen 11/07/2018, 12:19 [place] Message  Patient Details  Name: Pamela House MRN: [id number] Date of Birth: 1930/03/24   Medicare Important Message Given:  Yes    Kerin Salen 11/07/2018, 12:19 PM

## 2018-11-07 NOTE — Progress Notes (Signed)
Visited with Mrs. Halm at bedside.  No family present.  She appears better this morning than she did last week. She is not coughing or regurgitating at the moment.    PMT was asked to offer symptom relief for cough.  Mrs. Crean is on bid PPI.  Medications that may have contributed to cough have been discontinued.  Pulmonary feels this may be a progression of her IPF.   Oncology mentions interstitial pneumonitis is a possible complication of imatinib and should be reversible.  The patient is now on antibiotics.  I called her daughter on the phone to ask about her symptoms.  Pearl states her mother's cough is already much improved.  Opioid is a centrally acting cough suppressant and used in very small doses may benefit Mrs. Mcswain.  I recommend tussionex (hydrocodone) given once daily PRN cough and once at bedtime PRN cough.  If her symptoms improve the hydrocodone should be discontinued.  I discussed my recommendation with her daughter [place].  PE: Thin frail elderly female, pleasantly demented, NAD CV irreg with murmur  Resp no distress at rest.  CTA anteriorly. Abdomen thin, soft, nt, nd LE no edema.   Florentina Jenny, PA-C Palliative Medicine Pager: [phone]  15 min.

## 2018-11-07 NOTE — Progress Notes (Signed)
PROGRESS NOTE  ZONDRA LAWLOR TKP:[id number] DOB: [birth year] DOA: 11/02/2018 PCP: Wenda Low, MD  HPI/Recap of past 24 hours: Pamela House is a 85 y.o. female with a past medical history of breast cancer, dementia, CML, interstitial lung disease, who lives by herself.  She was brought in by EMS who was called by family members due to concern for progressive shortness of breath over the past week or so.  Patient is a very poor historian due to her dementia.  Apparently she has been getting progressively short of breath over the past many days.  She would get short of breath while talking to family members over the phone.  When family member went to check on the patient today she was noted to get extremely short winded and almost collapsed on the couch.  Oxygen saturations when checked by EMS was about 90%.  She was put on 2 L.  She was brought into the hospital.  History is very limited from this patient.  Patient's daughter was at the bedside.  Patient denies any chest pain.  No nausea vomiting.  Some lower extremity swelling but not significantly so.  Denies any cough fever.  No dizziness or lightheadedness.  In the emergency department x-ray showed chronic changes of lung disease.  Her BNP was noted to be mildly elevated.  ED provider was concerned about her lower extremity edema.  She was given a dose of Lasix.  Patient continues to be short of breath even with minimal exertion.  Admitted for acute hypoxic respiratory failure.  Pulmonology consulted and following.  Hospital course complicated by significant generalized weakness in the setting of failure to thrive.  PT recommended SNF.  Discussed with Dr Jana Hakim, ok to stop gleevec. CML stable.  11/07/2018: Patient seen and examined with her daughter at bedside.  Conversational dyspnea is improving.  Denies chest pain or palpitations.  No new complaints.   Assessment/Plan: Principal Problem:   Dyspnea Active Problems:   CML (chronic  myelocytic leukemia) (HCC)   Hypertension   ILD (interstitial lung disease) ([place])   Malignant neoplasm of upper-outer quadrant of left breast in female, estrogen receptor negative ([place])   Hyponatremia   IPF (idiopathic pulmonary fibrosis) ([place])   Palliative care encounter   DNR (do not resuscitate)   Protein-calorie malnutrition, severe   Cough  Persistent acute hypoxic respiratory failure suspect secondary to idiopathic pulmonary fibrosis/interstitial lung disease versus acute COPD exacerbation -Management as recommended by pulmonology Maintain O2 saturation greater than 92% O2 saturation 85% on room air at rest DME oxygen 2 L continuously for comfort/shortness of breath Continue to monitor O2 saturation Last CT chest was in 2017, repeat CT chest with contrast to assess the severity of ILD   Acute COPD exacerbation Continue nebs Continue steroids Start IV azithromycin Switch to p.o. azithromycin tomorrow 11/08/2018 for discharge planning to SNF Failed home O2 evaluation today Will require 2 L of oxygen via nasal cannula DME oxygen 2 L continuously  Idiopathic pulmonary fibrosis/interstitial lung disease Management as recommended by pulmonology Independently reviewed CT chest done on 11/06/2018 which revealed interstitial lung disease with pulmonary fibrosis  Severe pulmonary hypertension secondary to chronic lung disease/idiopathic pulmonary fibrosis Latest 2D echo done on 11/03/2018 Management as stated above  Failure to thrive in an adult Appears severely emaciated BMI 12 Continue oral supplement Continue to encourage increasing oral protein calorie intake  Resolving hypovolemic hyponatremia  Presented with sodium level of 124 on 2/20 1:24 AM Sodium level 132 on 11/07/2018  Decrease rate of IV fluid as recommended by pulmonology Continue to monitor urine output BMP in the morning  Ascending thoracic aortic aneurysm measuring 3.4 cm, unchanged Recommend annual  follow-up by CTA/MRI  Resolved leukopenia   Mildly elevated troponin, suspect demand mismatch Peaked at 0.13 and trended down to 0.08 Reports pleuritic chest pain worse when she coughs If third set of troponin continues to increase consult cardiology Independently reviewed twelve-lead EKG done on 11/03/2018 which revealed sinus rhythm with no specific ST-T changes  Chronic diastolic CHF No sign of acute exacerbation 2D echo done on 11/03/2018 revealed LVEF 55 to 60% Severe pulmonary hypertension  Severe protein calorie malnutrition/failure to thrive in adult Evidenced by loss of muscle mass BMI 12 Encourage increasing p.o. calorie intake  History of breast cancer: She has followed by medical oncology, Dr. Jana Hakim.  Has previously received radiation treatment.  Not noted to be on any active treatment currently.    History of CML: Followed by oncology.  As per the last note from October she is supposed to be on [place].  Discussed with Dr. Jana Hakim, okay to hold off Nina.  No current issues with CML.  Essential hypertension: Blood pressure noted to be elevated.  Resume her antihypertensive medications.  Ambulatory dysfunction PT assessed and recommended SNF Fall precautions Continue PT Awaiting placement to SNF  Goals of care Palliative care team consulted to establish goals of care DNR as of 11/03/2018    DVT Prophylaxis: Lovenox Code Status:  DNR Family Communication:  Updated daughter at bedside on 11/07/2018 Disposition:  SNF when hemodynamically stable possibly tomorrow 11/08/2018 Consults called:  Palliative care team, pulmonology   Objective: Vitals:   11/06/18 2100 11/07/18 0447 11/07/18 0448 11/07/18 1345  BP: 109/60 115/65  (!) 102/50  Pulse: 62 (!) 52  64  Resp:  18  14  Temp:  98.1 F (36.7 C)  98.5 F (36.9 C)  TempSrc:  Oral  Oral  SpO2: 100% 100%  100%  Weight:   35.5 kg   Height:        Intake/Output Summary (Last 24 hours) at 11/07/2018  1805 Last data filed at 11/07/2018 1800 Gross per 24 hour  Intake 1256.74 ml  Output -  Net 1256.74 ml   Filed Weights   11/05/18 0500 11/06/18 0613 11/07/18 0448  Weight: 33.2 kg 39.8 kg 35.5 kg    Exam:  . General: 85 y.o. year-old female frail-appearing in no acute distress.  Alert and oriented in the setting of advanced dementia . Cardiovascular: Regular Rate and Rhythm with No Rubs or Gallops.  No JVD or Thyromegaly . Respiratory: Mild rales at bases with no wheezes.  Poor inspiratory effort. . Abdomen: Soft nontender nondistended with normal bowel sounds x4 quadrants. . Musculoskeletal: No lower extremity edema. 2/4 pulses in all 4 extremities. Marland Kitchen Psychiatry: Mood is appropriate for condition and setting   Data Reviewed: CBC: Recent Labs  Lab 11/02/18 1200 11/03/18 0431 11/04/18 0826 11/07/18 0742  WBC 2.9* 2.5* 5.8 4.9  NEUTROABS 1.3*  --  4.8 3.7  HGB 13.0 13.4 14.0 11.6*  HCT 41.0 41.5 43.5 39.1  MCV 92.6 89.8 91.0 97.8  PLT 331 329 312 144   Basic Metabolic Panel: Recent Labs  Lab 11/03/18 0431 11/04/18 0826 11/05/18 0627 11/06/18 1351 11/07/18 0742  NA 124* 127* 130* 132* 133*  K 4.7 4.9 4.9 4.7 4.2  CL 85* 85* 90* 88* 92*  CO2 30 34* 31 35* 36*  GLUCOSE 127* 94 116* 151*  87  BUN 16 28* 35* 41* 31*  CREATININE 0.78 0.82 0.85 0.91 0.81  CALCIUM 9.5 9.9 9.7 9.6 9.3  MG  --  2.3  --   --   --   PHOS  --  3.4  --   --   --    GFR: Estimated Creatinine Clearance: 27.9 mL/min (by C-G formula based on SCr of 0.81 mg/dL). Liver Function Tests: Recent Labs  Lab 11/02/18 1200 11/03/18 0431 11/04/18 0826  AST _0 ALT _1 ALKPHOS 75 76 87  BILITOT 0.5 0.4 0.4  PROT 7.1 6.7 7.1  ALBUMIN 3.7 3.4* 3.5   No results for input(s): LIPASE, AMYLASE in the last 168 hours. No results for input(s): AMMONIA in the last 168 hours. Coagulation Profile: No results for input(s): INR, PROTIME in the last 168 hours. Cardiac Enzymes: Recent Labs  Lab  11/02/18 1707 11/02/18 2217 11/03/18 1504  TROPONINI 0.11* 0.13* 0.08*   BNP (last 3 results) No results for input(s): PROBNP in the last 8760 hours. HbA1C: No results for input(s): HGBA1C in the last 72 hours. CBG: No results for input(s): GLUCAP in the last 168 hours. Lipid Profile: No results for input(s): CHOL, HDL, LDLCALC, TRIG, CHOLHDL, LDLDIRECT in the last 72 hours. Thyroid Function Tests: No results for input(s): TSH, T4TOTAL, FREET4, T3FREE, THYROIDAB in the last 72 hours. Anemia Panel: No results for input(s): VITAMINB12, FOLATE, FERRITIN, TIBC, IRON, RETICCTPCT in the last 72 hours. Urine analysis:    Component Value Date/Time   COLORURINE COLORLESS (A) 11/02/2018 1533   APPEARANCEUR CLEAR 11/02/2018 1533   LABSPEC 1.004 (L) 11/02/2018 1533   LABSPEC 1.010 10/21/2015 1135   PHURINE 8.0 11/02/2018 1533   GLUCOSEU NEGATIVE 11/02/2018 1533   GLUCOSEU Negative 10/21/2015 1135   HGBUR NEGATIVE 11/02/2018 1533   BILIRUBINUR NEGATIVE 11/02/2018 1533   BILIRUBINUR Negative 10/21/2015 [place] 11/02/2018 1533   PROTEINUR NEGATIVE 11/02/2018 1533   UROBILINOGEN 0.2 10/21/2015 1135   NITRITE NEGATIVE 11/02/2018 1533   LEUKOCYTESUR NEGATIVE 11/02/2018 1533   LEUKOCYTESUR Negative 10/21/2015 1135   Sepsis Labs: _2 (procalcitonin:4,lacticidven:4)  )No results found for this or any previous visit (from the past 240 hour(s)).    Studies: No results found.  Scheduled Meds: . amLODipine  5 mg Oral Daily  . aspirin EC  81 mg Oral Daily  . benzonatate  200 mg Oral TID  . carvedilol  25 mg Oral BID  . donepezil  5 mg Oral QHS  . enoxaparin (LOVENOX) injection  30 mg Subcutaneous Q24H  . feeding supplement (ENSURE ENLIVE)  237 mL Oral TID BM  . multivitamin with minerals  1 tablet Oral Daily  . pantoprazole  40 mg Oral BID  . predniSONE  40 mg Oral Q breakfast  . sodium chloride flush  3 mL Intravenous Q12H    Continuous Infusions: . sodium  chloride 50 mL/hr at 11/07/18 1800  . azithromycin 500 mg (11/07/18 0657)     LOS: 5 days     Kayleen Memos, MD [facility] Pager [phone]  If 7PM-7AM, please contact night-coverage [email or web address] Password [facility] 11/07/2018, 6:05 PM

## 2018-11-07 NOTE — Progress Notes (Signed)
Pt's daughter and granddaughter visiting SNFs to make selection. Once selected, insurance authorization can be initiated.  Sharren Bridge, MSW, LCSW Clinical Social Work 11/07/2018 [phone]

## 2018-11-07 NOTE — Progress Notes (Signed)
Pamela House   DOB:1930-08-19   HF#:[id number]   MCE#:[id number]  Subjective:  Pamela House does not recognize me-- she last saw me a year ago, failed to show to her last appointment with [place] OCT 2019. Our SW contacted her at that time and suggested consideration of placement. -- Patient is not able to tell me what medications she was taking at home. She does not know why she is in the hospital. No fmily in room   Objective: elderly African American woman examined in bed Vitals:   11/06/18 2100 11/07/18 0447  BP: 109/60 115/65  Pulse: 62 (!) 52  Resp:  18  Temp:  98.1 F (36.7 C)  SpO2: 100% 100%    Body mass index is 13.86 kg/m.  Intake/Output Summary (Last 24 hours) at 11/07/2018 4497 Last data filed at 11/07/2018 0600 Gross per 24 hour  Intake 934.86 ml  Output -  Net 934.86 ml     Sclerae unicteric  Lungs no rales or wheezes--auscultated anterolaterally  Heart regular rate and rhythm  Abdomen soft, +BS  Neuro nonfocal; she is alert, clear speech, interacts well, but is not able to give me an account of why she is here or what was going on at home that brought her to the hospital  Breast exam: deferred  CBG (last 3)  No results for input(s): GLUCAP in the last 72 hours.   Labs:  Lab Results  Component Value Date   WBC 4.9 11/07/2018   HGB 11.6 (L) 11/07/2018   HCT 39.1 11/07/2018   MCV 97.8 11/07/2018   PLT 292 11/07/2018   NEUTROABS 3.7 11/07/2018    _0 @  Urine Studies No results for input(s): UHGB, CRYS in the last 72 hours.  Invalid input(s): UACOL, UAPR, USPG, UPH, UTP, UGL, UKET, UBIL, UNIT, UROB, [place], UEPI, UWBC, [place], [place], [place], [place], [place]  Basic Metabolic Panel: Recent Labs  Lab 11/02/18 1200 11/03/18 0431 11/04/18 0826 11/05/18 0627 11/06/18 1351  NA 126* 124* 127* 130* 132*  K 4.9 4.7 4.9 4.9 4.7  CL 88* 85* 85* 90* 88*  CO2 31 30 34* 31 35*  GLUCOSE 76 127* 94 116* 151*  BUN 16 16 28* 35* 41*  CREATININE 0.75 0.78 0.82 0.85  0.91  CALCIUM 9.7 9.5 9.9 9.7 9.6  MG  --   --  2.3  --   --   PHOS  --   --  3.4  --   --    GFR Estimated Creatinine Clearance: 24.9 mL/min (by C-G formula based on SCr of 0.91 mg/dL). Liver Function Tests: Recent Labs  Lab 11/02/18 1200 11/03/18 0431 11/04/18 0826  AST _1 ALT _2 ALKPHOS 75 76 87  BILITOT 0.5 0.4 0.4  PROT 7.1 6.7 7.1  ALBUMIN 3.7 3.4* 3.5   No results for input(s): LIPASE, AMYLASE in the last 168 hours. No results for input(s): AMMONIA in the last 168 hours. Coagulation profile No results for input(s): INR, PROTIME in the last 168 hours.  CBC: Recent Labs  Lab 11/02/18 1200 11/03/18 0431 11/04/18 0826 11/07/18 0742  WBC 2.9* 2.5* 5.8 4.9  NEUTROABS 1.3*  --  4.8 3.7  HGB 13.0 13.4 14.0 11.6*  HCT 41.0 41.5 43.5 39.1  MCV 92.6 89.8 91.0 97.8  PLT 331 329 312 292   Cardiac Enzymes: Recent Labs  Lab 11/02/18 1707 11/02/18 2217 11/03/18 1504  TROPONINI 0.11* 0.13* 0.08*   BNP: Invalid input(s): POCBNP CBG: No results  for input(s): GLUCAP in the last 168 hours. D-Dimer No results for input(s): DDIMER in the last 72 hours. Hgb A1c No results for input(s): HGBA1C in the last 72 hours. Lipid Profile No results for input(s): CHOL, HDL, LDLCALC, TRIG, CHOLHDL, LDLDIRECT in the last 72 hours. Thyroid function studies No results for input(s): TSH, T4TOTAL, T3FREE, THYROIDAB in the last 72 hours.  Invalid input(s): FREET3 Anemia work up No results for input(s): VITAMINB12, FOLATE, FERRITIN, TIBC, IRON, RETICCTPCT in the last 72 hours. Microbiology No results found for this or any previous visit (from the past 240 hour(s)).    Studies:  Ct Chest W Contrast  Result Date: 11/06/2018 CLINICAL DATA:  Known interstitial lung disease with progressive shortness of breath over the past week. Dementia. EXAM: CT CHEST WITH CONTRAST TECHNIQUE: Multidetector CT imaging of the chest was performed during intravenous contrast administration.  CONTRAST:  22m OMNIPAQUE IOHEXOL 300 MG/ML  SOLN COMPARISON:  12/03/2015 and 05/21/2013 FINDINGS: Cardiovascular: Stable cardiomegaly. Mild calcified plaque over the left main and 3 vessel coronary arteries. Mild calcified plaque over the thoracic aorta. Mild ectasia of the ascending thoracic aorta measuring 3.4 cm in AP diameter unchanged. Mild prominence of the main pulmonary arteries possibly due to pulmonary arterial hypertension. Mediastinum/Nodes: No significant hilar or mediastinal adenopathy. Remaining mediastinal structures are unremarkable. Lungs/Pleura: Lungs are adequately inflated and demonstrate persistent known bilateral peripheral interstitial disease with mild interval progression. Mild associated bronchiectatic changes. Slight worsening opacification over the lateral right mid lung adjacent the major fissure possible minimal cavitation. Subtle possible mixed interstitial airspace density in the lung bases as it would be difficult to exclude a concomitant acute infectious process. No effusion. Upper Abdomen: 1 cm hypodensity over the upper pole right kidney too small to characterize but likely a cyst. Calcified plaque over the abdominal aorta. Musculoskeletal: Degenerative change of the spine. Stable loss of mid vertebral body height of a lower thoracic vertebral body. IMPRESSION: Evidence of patient's known interstitial lung disease with mild interval progression. Possible mixed interstitial airspace density over the lung bases and lateral right mid lung adjacent the major fissure which may be due to concomitant infection. Recommend follow-up CT 4 weeks. Stable mild ectasia of the ascending thoracic aorta measuring 3.4 cm in AP diameter. Recommend annual imaging followup by CTA or MRA. This recommendation follows 2010 ACCF/AHA/AATS/ACR/ASA/SCA/SCAI/SIR/STS/SVM Guidelines for the Diagnosis and Management of Patients with Thoracic Aortic Disease. Circulation.2010; 121:: V784-O962 Aortic aneurysm NOS  (ICD10-I71.9). Aortic Atherosclerosis (ICD10-I70.0). Atherosclerotic coronary artery disease. Prominence of the pulmonary arteries likely due to pulmonary arterial hypertension. 1 cm hypodensity over the upper pole right kidney too small to characterize but likely a cyst. Stable cardiomegaly. Stable mid vertebral body height loss of a lower thoracic vertebral body. Electronically Signed   By: DMarin OlpM.D.   On: 11/06/2018 15:52    Assessment: 85y[place]. [place] woman admitted 11/02/2018 with SOB, interstitial lung changes, past history as follows:  (0) status post left breast Upper outer quadrant biopsy 01/23/2015 for a clinical T1b N0, stage IA invasive ductal carcinoma, grade 3, estrogen and progesterone receptor negative, but HER-2 amplified, with an MIB-1 of 79%  (1) genetics testing recommended--patient with breast cancer, family history of ovarian cancer  (2) left lumpectomy with sentinel lymph node biopsy on 02/27/2015 for a pT1b pNX tumor with negative margins; total of 4 sentinel nodes removed  (3) adjuvant anti-HER-2 immunotherapy with trastuzumab every 3 weeks through 1 year, started 03/28/15. Patient declined chemotherapy (abraxane).             (  a) echocardiogram 09/29/2015 showed an ejection fraction of 45-50%, with diffuse hypokinesis. Herceptin interrupted after 09/09/2015 dose             (b) echocardiogram of 03/23/2016 shows an ejection fraction of 60-65%.  (4) radiation 04/30/15-05/29/15: Left breast/ 42.72 Gy at 2.67 Gy per fraction x 21 fractions.  Left breast boost/ 10 Gy at 2 Gy per fraction x 5 fractions  (5) history of chronic myeloid leukemia, on imatinib/Gleevec 400 mg daily             (a) bcr.abl / abl ratio 0.00013 as of 05/09/2015.             (b) bcr.abl not detected on August, 2018  (c) bcr.abl undetectable 06/26/2018    Plan:  Interstitial pneumonitis is a possible complication of imatinib. This should be reversible. Treatment involves stopping  imatinib and proceeding with high-dose steroids. This is being implemented. I anticipate improvement in patient's pulmonary function over next few days. Would then continue at lower doses (10-15 mg/day) until resolution.  Patient may need long-term placement because of dementia.Marland Kitchen  Her bcr.abl can be followed as outpatient-- if there is evidence of recurrent disease a different TKI can be initiated.  Will follow with you.   Chauncey Cruel, MD 11/07/2018  8:06 AM Medical Oncology and Hematology [facility] [address] [place], [place] [address] Tel. [phone]    Fax. [phone]

## 2018-11-08 DIAGNOSIS — I509 Heart failure, unspecified: Secondary | ICD-10-CM

## 2018-11-08 LAB — CBC
HCT: 36.6 % (ref 36.0–46.0)
Hemoglobin: 11 g/dL — ABNORMAL LOW (ref 12.0–15.0)
MCH: 29 pg (ref 26.0–34.0)
MCHC: 30.1 g/dL (ref 30.0–36.0)
MCV: 96.6 fL (ref 80.0–100.0)
Platelets: 309 10*3/uL (ref 150–400)
RBC: 3.79 MIL/uL — ABNORMAL LOW (ref 3.87–5.11)
RDW: 13.6 % (ref 11.5–15.5)
WBC: 6.7 10*3/uL (ref 4.0–10.5)
nRBC: 0 % (ref 0.0–0.2)

## 2018-11-08 LAB — BASIC METABOLIC PANEL
Anion gap: 6 (ref 5–15)
BUN: 28 mg/dL — ABNORMAL HIGH (ref 8–23)
CHLORIDE: 96 mmol/L — AB (ref 98–111)
CO2: 33 mmol/L — ABNORMAL HIGH (ref 22–32)
Calcium: 9.1 mg/dL (ref 8.9–10.3)
Creatinine, Ser: 0.66 mg/dL (ref 0.44–1.00)
GFR calc Af Amer: >60 mL/min (ref >60)
GFR calc non Af Amer: >60 mL/min (ref >60)
Glucose, Bld: 103 mg/dL — ABNORMAL HIGH (ref 70–99)
POTASSIUM: 4.1 mmol/L (ref 3.5–5.1)
Sodium: 135 mmol/L (ref 135–145)

## 2018-11-08 LAB — MAGNESIUM: Magnesium: 2.1 mg/dL (ref 1.7–2.4)

## 2018-11-08 LAB — PHOSPHORUS: Phosphorus: 2.7 mg/dL (ref 2.5–4.6)

## 2018-11-08 MED ORDER — PREDNISONE 10 MG PO TABS: 10.0000 mg | ORAL_TABLET | Freq: Every day | ORAL | Status: DC

## 2018-11-08 MED ORDER — HYDROXYZINE HCL 10 MG PO TABS
10.0000 mg | ORAL_TABLET | Freq: Once | ORAL | Status: AC
  Administered 2018-11-08: 10 mg via ORAL
  Filled 2018-11-08: qty 1

## 2018-11-08 MED ORDER — BENZONATATE 200 MG PO CAPS: 200.0000 mg | ORAL_CAPSULE | Freq: Three times a day (TID) | ORAL | 0 refills | Status: AC

## 2018-11-08 MED ORDER — AZITHROMYCIN 250 MG PO TABS: ORAL_TABLET | ORAL | 0 refills | Status: DC

## 2018-11-08 MED ORDER — ALPRAZOLAM 0.5 MG PO TABS: 0.5000 mg | ORAL_TABLET | Freq: Two times a day (BID) | ORAL | 0 refills | Status: AC | PRN

## 2018-11-08 MED ORDER — ALUM & MAG HYDROXIDE-SIMETH 200-200-20 MG/5ML PO SUSP
15.0000 mL | Freq: Four times a day (QID) | ORAL | Status: DC | PRN
  Administered 2018-11-08: 15 mL via ORAL
  Filled 2018-11-08: qty 30

## 2018-11-08 NOTE — Clinical Social Work Placement (Addendum)
D/C Summary sent Nurse given the number to call report.  [facility] arranged for transport. ETA: Unknown Room: 407   CLINICAL SOCIAL WORK PLACEMENT  NOTE  Date:  11/08/2018  Patient Details  Name: WAKEELAH SOLAN MRN: [id number] Date of Birth: 07/18/1930  Clinical Social Work is seeking post-discharge placement for this patient at the [place] level of care (*CSW will initial, date and re-position this form in  chart as items are completed):  Yes   Patient/family provided with [facility] Work Department's list of facilities offering this level of care within the geographic area requested by the patient (or if unable, by the patient's family).  Yes   Patient/family informed of their freedom to choose among providers that offer the needed level of care, that participate in Medicare, Medicaid or managed care program needed by the patient, have an available bed and are willing to accept the patient.  Yes   Patient/family informed of [facility]'s ownership interest in [facility] and [facility], as well as of the fact that they are under no obligation to receive care at these facilities.  PASRR submitted to EDS on       PASRR number received on       Existing PASRR number confirmed on 11/04/18     FL2 transmitted to all facilities in geographic area requested by pt/family on       FL2 transmitted to all facilities within larger geographic area on 11/04/18     Patient informed that his/her managed care company has contracts with or will negotiate with certain facilities, including the following:  [facility]     Yes   Patient/family informed of bed offers received.  Patient chooses bed at [facility]     Physician recommends and patient chooses bed at      Patient to be transferred to [facility] on 11/08/18.  Patient to be transferred to facility by [facility]      Patient family notified on 11/08/18 of transfer.  Name of family member  notified:  Daughter at beside     PHYSICIAN Please prepare priority discharge summary, including medications     Additional Comment:    _______________________________________________ Lia Hopping, [place] 11/08/2018, 2:06 PM

## 2018-11-08 NOTE — Progress Notes (Signed)
Physical Therapy Treatment Patient Details Name: Pamela House MRN: [id number] DOB: 1930/03/09 Today's Date: 11/08/2018    History of Present Illness 85 y.o. female with a past medical history of breast cancer, dementia, CML, interstitial lung disease, who lives by herself. She was brought in by EMS who was called by family members due to concern for progressive shortness of breath over the past week or so.  Pt admitted for Acute respiratory failure with hypoxia/progressive dyspnea/idiopathic pulmonary fibrosis    PT Comments    Pt assisted with ambulating in hallway and remained on 2L O2 [place] with SPO2 91%.  Pt fatigues quickly and requires min assist for safely mobilizing.  Continue to recommend SNF upon d/c.   Follow Up Recommendations  SNF     Equipment Recommendations  None recommended by PT    Recommendations for Other Services       Precautions / Restrictions Precautions Precautions: Fall Precaution Comments: watch O2 sats    Mobility  Bed Mobility               General bed mobility comments: pt up in recliner  Transfers Overall transfer level: Needs assistance Equipment used: Rolling walker (2 wheeled) Transfers: Sit to/from Stand Sit to Stand: Min assist         General transfer comment: verbal cues for hand placement, assist to rise and steady  Ambulation/Gait Ambulation/Gait assistance: Min assist Gait Distance (Feet): 80 Feet Assistive device: Rolling walker (2 wheeled) Gait Pattern/deviations: Step-through pattern;Decreased stride length;Trunk flexed;Narrow base of support     General Gait Details: verbal cues for safe use of RW (tends to keep too far forward), SPO2 91% on 2L O2 [place] during ambulation   Stairs             Wheelchair Mobility    Modified Rankin (Stroke Patients Only)       Balance                                            Cognition Arousal/Alertness: Awake/alert Behavior During Therapy: WFL  for tasks assessed/performed Overall Cognitive Status: History of cognitive impairments - at baseline                                 General Comments: follows one step commands, decreased memory, cues for safety      Exercises      General Comments        Pertinent Vitals/Pain Pain Assessment: Faces Faces Pain Scale: Hurts little more Pain Location: L eye (slightly swollen) Pain Descriptors / Indicators: Sore Pain Intervention(s): Limited activity within patient's tolerance;Monitored during session    Home Living                      Prior Function            PT Goals (current goals can now be found in the care plan section) Progress towards PT goals: Progressing toward goals    Frequency    Min 2X/week      PT Plan Current plan remains appropriate    Co-evaluation              AM-PAC PT "6 Clicks" Mobility   Outcome Measure  Help needed turning from your back to your side while in a flat  bed without using bedrails?: A Little Help needed moving from lying on your back to sitting on the side of a flat bed without using bedrails?: A Little Help needed moving to and from a bed to a chair (including a wheelchair)?: A Little Help needed standing up from a chair using your arms (e.g., wheelchair or bedside chair)?: A Little Help needed to walk in hospital room?: A Little Help needed climbing 3-5 steps with a railing? : A Lot 6 Click Score: 17    End of Session Equipment Utilized During Treatment: Gait belt;Oxygen Activity Tolerance: Patient limited by fatigue Patient left: in chair;with chair alarm set;with call bell/phone within reach;with family/visitor present Nurse Communication: Mobility status PT Visit Diagnosis: Other abnormalities of gait and mobility (R26.89)     Time: 0511-0211 PT Time Calculation (min) (ACUTE ONLY): 18 min  Charges:  $Gait Training: 8-22 mins                     Carmelia Bake, PT, DPT Acute  Rehabilitation Services Office: [phone] Pager: [phone] Trena Platt 11/08/2018, 3:18 PM

## 2018-11-08 NOTE — Progress Notes (Signed)
Report received from A. Georgina Quint, Therapist, sports. No change from initial pm assessment. Will continue to monitor and follow the POC.

## 2018-11-08 NOTE — Progress Notes (Signed)
Occupational Therapy Treatment Patient Details Name: Pamela House MRN: [id number] DOB: 28-Aug-1930 Today's Date: 11/08/2018    History of present illness 85 y.o. female with a past medical history of breast cancer, dementia, CML, interstitial lung disease, who lives by herself. She was brought in by EMS who was called by family members due to concern for progressive shortness of breath over the past week or so.  Pt admitted for Acute respiratory failure with hypoxia/progressive dyspnea/idiopathic pulmonary fibrosis   OT comments  Ambulated to bathroom, performed adl and toileting. Sats down to 85% on 2 liters; quickly returned to 90 with pursed lip breathing  Follow Up Recommendations  SNF    Equipment Recommendations  3 in 1 bedside commode    Recommendations for Other Services      Precautions / Restrictions Precautions Precautions: Fall Precaution Comments: watch O2 sats Restrictions Weight Bearing Restrictions: No Other Position/Activity Restrictions: watch O2 sats       Mobility Bed Mobility         Supine to sit: Supervision;HOB elevated     General bed mobility comments: extra time and increased effort  Transfers   Equipment used: Rolling walker (2 wheeled)   Sit to Stand: Min assist         General transfer comment: light steadying assistance and cues for UE placement    Balance                                           ADL either performed or assessed with clinical judgement   ADL       Grooming: Wash/dry hands;Wash/dry face;Supervision/safety;Sitting   Upper Body Bathing: Min guard;Sitting(for lines/leads)   Lower Body Bathing: Moderate assistance;Sit to/from stand   Upper Body Dressing : Minimal assistance;Sitting(lines)       Toilet Transfer: Minimal assistance;RW;BSC;Ambulation   Toileting- Clothing Manipulation and Hygiene: Moderate assistance;Sit to/from stand         General ADL Comments: ambulated to  bathroom and completed adl and toileting     Vision       Perception     Praxis      Cognition Arousal/Alertness: Awake/alert Behavior During Therapy: WFL for tasks assessed/performed Overall Cognitive Status: History of cognitive impairments - at baseline                                 General Comments: follows one step commands, decreased memory, cues for safety        Exercises     Shoulder Instructions       General Comments sats 85-90% on 2 liters 02; cued for pursed lip breathing    Pertinent Vitals/ Pain       Pain Assessment: Faces Faces Pain Scale: Hurts little more Pain Location: eye Pain Intervention(s): Limited activity within patient's tolerance;Monitored during session  Home Living                                          Prior Functioning/Environment              Frequency  Min 2X/week        Progress Toward Goals  OT Goals(current goals can now be found in the care plan section)  Progress towards OT goals: Progressing toward goals  Acute Rehab OT Goals Patient Stated Goal: pt: to go home. family: rehab at [facility].  Plan      Co-evaluation                 AM-PAC OT "6 Clicks" Daily Activity     Outcome Measure   Help from another person eating meals?: None Help from another person taking care of personal grooming?: A Little Help from another person toileting, which includes using toliet, bedpan, or urinal?: A Little Help from another person bathing (including washing, rinsing, drying)?: A Lot Help from another person to put on and taking off regular upper body clothing?: A Little Help from another person to put on and taking off regular lower body clothing?: A Lot 6 Click Score: 17    End of Session Equipment Utilized During Treatment: Gait belt;Rolling walker;Other (comment)  OT Visit Diagnosis: Unsteadiness on feet (R26.81);Muscle weakness (generalized) (M62.81)   Activity Tolerance  Patient tolerated treatment well   Patient Left in chair;with call bell/phone within reach;with chair alarm set;with family/visitor present   Nurse Communication          Time: [phone] OT Time Calculation (min): 27 min  Charges: OT General Charges $OT Visit: 1 Visit OT Treatments $Self Care/Home Management : 23-37 mins  Lesle Chris, OTR/L Acute Rehabilitation Services [phone] [place] pager [phone] office 11/08/2018   [place] 11/08/2018, 9:46 AM

## 2018-11-08 NOTE — Discharge Summary (Signed)
Physician Discharge Summary  Pamela House TZG:[id number] DOB: 08/24/1930 DOA: 11/02/2018  PCP: Wenda Low, MD  Admit date: 11/02/2018 Discharge date: 11/08/2018  Admitted From: Home Disposition:  SNF  Recommendations for Outpatient Follow-up:  1. Follow up with PCP in 1-2 weeks  Discharge Condition:Improved CODE STATUS:DNR Diet recommendation: Soft   Brief/Interim Summary: 85 y.o.femalewith a past medical history of breast cancer, dementia, CML, interstitial lung disease, who lives by herself. She was brought in by EMS who was called by family members due to concern for progressive shortness of breath over the past week or so. Patient is a very poor historian due to her dementia. Apparently she has been getting progressively short of breath over the past many days. She would get short of breath while talking to family members over the phone. When family member went to check on the patient today she was noted to get extremely short winded and almost collapsed on the couch. Oxygen saturations when checked by EMS was about 90%. She was put on 2 L. She was brought into the hospital. History is very limited from this patient. Patient's daughter was at the bedside. Patient denies any chest pain. No nausea vomiting. Some lower extremity swelling but not significantly so. Denies any cough fever. No dizziness or lightheadedness.  In the emergency department x-ray showed chronic changes of lung disease. Her BNP was noted to be mildly elevated. ED provider was concerned about her lower extremity edema. She was given a dose of Lasix. Patient continues to be short of breath even with minimal exertion.  Admitted for acute hypoxic respiratory failure.  Pulmonology consulted and following.  Hospital course complicated by significant generalized weakness in the setting of failure to thrive.  PT recommended SNF.  Discussed with Dr Jana Hakim, ok to stop gleevec. CML  stable.  Discharge Diagnoses:  Principal Problem:   Dyspnea Active Problems:   CML (chronic myelocytic leukemia) (HCC)   Hypertension   ILD (interstitial lung disease) ([place])   Malignant neoplasm of upper-outer quadrant of left breast in female, estrogen receptor negative ([place])   Hyponatremia   IPF (idiopathic pulmonary fibrosis) ([place])   Palliative care encounter   DNR (do not resuscitate)   Protein-calorie malnutrition, severe   Cough   Persistent acute hypoxic respiratory failure suspect secondary to idiopathic pulmonary fibrosis/interstitial lung disease versus acute COPD exacerbation -DME continue on 2 L O2 continuously for comfort/shortness of breath Continue to monitor O2 saturation Last CT chest was in 2017, repeat CT chest with contrast to assess the severity of ILD   Acute COPD exacerbation Continued on neb tx Continue steroids Started on azithromycin, transitioned to PO azithro Will require 2 L of oxygen via nasal cannula  Idiopathic pulmonary fibrosis/interstitial lung disease Management as recommended by pulmonology Independently reviewed CT chest done on 11/06/2018 which revealed interstitial lung disease with pulmonary fibrosis  Severe pulmonary hypertension secondary to chronic lung disease/idiopathic pulmonary fibrosis Latest 2D echo done on 11/03/2018  Failure to thrive in an adult Appears severely emaciated BMI 12 Continue oral supplement Continue to encourage increasing oral protein calorie intake  Resolving hypovolemic hyponatremia  Presented with sodium level of 124 on 2/20 1:24 AM Sodium level 132 on 11/07/2018  Stable  Ascending thoracic aortic aneurysm measuring 3.4 cm, unchanged Recommend annual follow-up by CTA/MRI  Resolved leukopenia   Mildly elevated troponin, suspect demand mismatch Peaked at 0.13 and trended down to 0.08 Reports pleuritic chest pain worse when she coughs If third set of troponin continues to increase  consult  cardiology Independently reviewed twelve-lead EKG done on 11/03/2018 which revealed sinus rhythm with no specific ST-T changes  Chronic diastolic CHF No sign of acute exacerbation 2D echo done on 11/03/2018 revealed LVEF 55 to 60% Severe pulmonary hypertension  Severe protein calorie malnutrition/failure to thrive in adult Evidenced by loss of muscle mass BMI 12 Encourage increasing p.o. calorie intake  History of breast cancer: She has followed by medical oncology, Dr. Jana Hakim. Has previously received radiation treatment. Not noted to be on any active treatment currently.  History of CML: Followed by oncology. As per the last note from October she is supposed to be on [employer].  Discussed with Dr. Jana Hakim, okay to hold off [place].  No current issues with CML.  Essential hypertension: Blood pressure noted to be elevated. Resume her antihypertensive medications.  Ambulatory dysfunction PT assessed and recommended SNF Fall precautions Continue PT Plan SNF today  Goals of care Palliative care team consulted to establish goals of care DNR as of 11/03/2018  Discharge Instructions   Allergies as of 11/08/2018      Reactions   Ace Inhibitors Cough   Hyzaar [losartan Potassium-hctz] Cough   Lipitor [atorvastatin]    Muscle fatigue   Losartan Cough   Tiazac [diltiazem Hcl Er Beads] Cough      Medication List    STOP taking these medications   imatinib 400 MG tablet Commonly known as:  GLEEVEC   potassium chloride 10 MEQ tablet Commonly known as:  K-DUR,KLOR-CON     TAKE these medications   ALIGN PO Take by mouth.   ALPRAZolam 0.5 MG tablet Commonly known as:  XANAX Take 1 tablet (0.5 mg total) by mouth 2 (two) times daily as needed for anxiety. Prescription needed for skilled nursing What changed:  additional instructions   amLODipine 5 MG tablet Commonly known as:  NORVASC Take 5 mg by mouth daily.   aspirin 81 MG tablet Take 81 mg by mouth  daily.   azithromycin 250 MG tablet Commonly known as:  ZITHROMAX 1 tab po daily x 3 more days, then stop, zero refills   benzonatate 200 MG capsule Commonly known as:  TESSALON Take 1 capsule (200 mg total) by mouth 3 (three) times daily.   carvedilol 25 MG tablet Commonly known as:  COREG Take 1 tablet (25 mg total) by mouth 2 (two) times daily.   donepezil 5 MG tablet Commonly known as:  ARICEPT Take 5 mg by mouth at bedtime.   fluticasone 50 MCG/ACT nasal spray Commonly known as:  FLONASE Place 2 sprays into both nostrils daily.   latanoprost 0.005 % ophthalmic solution Commonly known as:  XALATAN Place 1 drop into both eyes at bedtime.   mirtazapine 15 MG tablet Commonly known as:  REMERON   omeprazole 20 MG capsule Commonly known as:  PRILOSEC TAKE 1 CAPSULE BY MOUTH TWICE A DAY   predniSONE 10 MG tablet Commonly known as:  DELTASONE Take 1 tablet (10 mg total) by mouth daily. Taper dose: 72m po daily x 3 days, then 221mpo daily x 3 days, then 1057mo daily x 3 days, then 5mg24m daily x 2 days, then stop, zero refills   Vitamin D (Ergocalciferol) 1.25 MG (50000 UT) Caps capsule Commonly known as:  DRISDOL Take 50,000 Units by mouth every 30 (thirty) days.            Durable Medical Equipment  (From admission, onward)         Start  Ordered   11/06/18 1322  For home use only DME oxygen  Once    Question Answer Comment  Mode or (Route) Nasal cannula   Liters per Minute 2   Frequency Continuous (stationary and portable oxygen unit needed)   Oxygen conserving device Yes   Oxygen delivery system Gas      11/06/18 1322          Contact information for follow-up providers    Wenda Low, MD. Call.   Specialty:  Internal Medicine Why:  Please call for a post hospital follow-up appointment.  Contact information: [address] [place] [address] [phone]            Contact information for after-discharge care     Destination    [facility] .   Service:  Skilled Nursing Contact information: [address] [place] [phone]                 Allergies  Allergen Reactions  . Ace Inhibitors Cough  . Hyzaar [Losartan Potassium-Hctz] Cough  . Lipitor [Atorvastatin]     Muscle fatigue  . Losartan Cough  . Tiazac [Diltiazem Hcl Er Beads] Cough    Consultations:  Pulmonary  Palliative Care  Procedures/Studies: Dg Chest 2 View  Result Date: 11/02/2018 CLINICAL DATA:  Shortness of breath. History of pulmonary fibrosis, breast cancer and CML EXAM: CHEST - 2 VIEW COMPARISON:  Chest x-ray 06/26/2018 and chest CT 12/03/2015 FINDINGS: The heart is mildly enlarged but stable. There is moderate tortuosity and ectasia of the thoracic aorta. Severe chronic lung disease/pulmonary fibrosis without definite acute overlying pulmonary process. Low lung volumes. The bony thorax is intact. IMPRESSION: Severe chronic lung disease without obvious acute overlying pulmonary process. Electronically Signed   By: Marijo Sanes M.D.   On: 11/02/2018 12:16   Ct Chest W Contrast  Result Date: 11/06/2018 CLINICAL DATA:  Known interstitial lung disease with progressive shortness of breath over the past week. Dementia. EXAM: CT CHEST WITH CONTRAST TECHNIQUE: Multidetector CT imaging of the chest was performed during intravenous contrast administration. CONTRAST:  17m OMNIPAQUE IOHEXOL 300 MG/ML  SOLN COMPARISON:  12/03/2015 and 05/21/2013 FINDINGS: Cardiovascular: Stable cardiomegaly. Mild calcified plaque over the left main and 3 vessel coronary arteries. Mild calcified plaque over the thoracic aorta. Mild ectasia of the ascending thoracic aorta measuring 3.4 cm in AP diameter unchanged. Mild prominence of the main pulmonary arteries possibly due to pulmonary arterial hypertension. Mediastinum/Nodes: No significant hilar or mediastinal adenopathy. Remaining  mediastinal structures are unremarkable. Lungs/Pleura: Lungs are adequately inflated and demonstrate persistent known bilateral peripheral interstitial disease with mild interval progression. Mild associated bronchiectatic changes. Slight worsening opacification over the lateral right mid lung adjacent the major fissure possible minimal cavitation. Subtle possible mixed interstitial airspace density in the lung bases as it would be difficult to exclude a concomitant acute infectious process. No effusion. Upper Abdomen: 1 cm hypodensity over the upper pole right kidney too small to characterize but likely a cyst. Calcified plaque over the abdominal aorta. Musculoskeletal: Degenerative change of the spine. Stable loss of mid vertebral body height of a lower thoracic vertebral body. IMPRESSION: Evidence of patient's known interstitial lung disease with mild interval progression. Possible mixed interstitial airspace density over the lung bases and lateral right mid lung adjacent the major fissure which may be due to concomitant infection. Recommend follow-up CT 4 weeks. Stable mild ectasia of the ascending thoracic aorta measuring 3.4 cm in  AP diameter. Recommend annual imaging followup by CTA or MRA. This recommendation follows 2010 ACCF/AHA/AATS/ACR/ASA/SCA/SCAI/SIR/STS/SVM Guidelines for the Diagnosis and Management of Patients with Thoracic Aortic Disease. Circulation.2010; 121: W098-J191. Aortic aneurysm NOS (ICD10-I71.9). Aortic Atherosclerosis (ICD10-I70.0). Atherosclerotic coronary artery disease. Prominence of the pulmonary arteries likely due to pulmonary arterial hypertension. 1 cm hypodensity over the upper pole right kidney too small to characterize but likely a cyst. Stable cardiomegaly. Stable mid vertebral body height loss of a lower thoracic vertebral body. Electronically Signed   By: Marin Olp M.D.   On: 11/06/2018 15:52   Vas [place] Lower Extremity Venous (dvt)  Result Date: 11/03/2018  Lower  Venous Study Indications: Swelling.  Limitations: Body habitus. Performing Technologist: Abram Sander RVS  Examination Guidelines: A complete evaluation includes B-mode imaging, spectral Doppler, color Doppler, and power Doppler as needed of all accessible portions of each vessel. Bilateral testing is considered an integral part of a complete examination. Limited examinations for reoccurring indications may be performed as noted.  Right Venous Findings: +---------+---------------+---------+-----------+----------+-------+          CompressibilityPhasicitySpontaneityPropertiesSummary +---------+---------------+---------+-----------+----------+-------+ CFV      Full           Yes      Yes                          +---------+---------------+---------+-----------+----------+-------+ SFJ      Full                                                 +---------+---------------+---------+-----------+----------+-------+ FV Prox  Full                                                 +---------+---------------+---------+-----------+----------+-------+ FV Mid   Full                                                 +---------+---------------+---------+-----------+----------+-------+ FV DistalFull                                                 +---------+---------------+---------+-----------+----------+-------+ PFV      Full                                                 +---------+---------------+---------+-----------+----------+-------+ POP      Full           Yes      Yes                          +---------+---------------+---------+-----------+----------+-------+ PTV      Full                                                 +---------+---------------+---------+-----------+----------+-------+  PERO     Full                                                 +---------+---------------+---------+-----------+----------+-------+  Left Venous Findings:  +---------+---------------+---------+-----------+----------+--------------+          CompressibilityPhasicitySpontaneityPropertiesSummary        +---------+---------------+---------+-----------+----------+--------------+ CFV      Full           Yes      Yes                                 +---------+---------------+---------+-----------+----------+--------------+ SFJ      Full                                                        +---------+---------------+---------+-----------+----------+--------------+ FV Prox  Full                                                        +---------+---------------+---------+-----------+----------+--------------+ FV Mid   Full                                                        +---------+---------------+---------+-----------+----------+--------------+ FV DistalFull                                                        +---------+---------------+---------+-----------+----------+--------------+ PFV      Full                                                        +---------+---------------+---------+-----------+----------+--------------+ POP      Full           Yes      Yes                                 +---------+---------------+---------+-----------+----------+--------------+ PTV      Full                                                        +---------+---------------+---------+-----------+----------+--------------+ PERO  Not visualized +---------+---------------+---------+-----------+----------+--------------+    Summary: Right: There is no evidence of deep vein thrombosis in the lower extremity. No cystic structure found in the popliteal fossa. Left: There is no evidence of deep vein thrombosis in the lower extremity. No cystic structure found in the popliteal fossa.  *See table(s) above for measurements and observations. Electronically signed by  Deitra Mayo MD on 11/03/2018 at 2:35:57 PM.    Final     Subjective: Eager to start therapy  Discharge Exam: Vitals:   11/08/18 0945 11/08/18 1342  BP: 135/72 (!) 103/51  Pulse:  63  Resp:  18  Temp:  98.6 F (37 C)  SpO2:  99%   Vitals:   11/07/18 2030 11/08/18 0419 11/08/18 0945 11/08/18 1342  BP: 114/67 136/73 135/72 (!) 103/51  Pulse: 66 60  63  Resp: _0 Temp: 98.4 F (36.9 C) 98.1 F (36.7 C)  98.6 F (37 C)  TempSrc: Oral Oral  Oral  SpO2: 100% 100%  99%  Weight:      Height:        General: Pt is alert, awake, not in acute distress Cardiovascular: RRR, S1/S2 +, no rubs, no gallops Respiratory: CTA bilaterally, no wheezing, no rhonchi Abdominal: Soft, NT, ND, bowel sounds + Extremities: no edema, no cyanosis   The results of significant diagnostics from this hospitalization (including imaging, microbiology, ancillary and laboratory) are listed below for reference.     Microbiology: No results found for this or any previous visit (from the past 240 hour(s)).   Labs: BNP (last 3 results) Recent Labs    11/02/18 1200  BNP 448.1*   Basic Metabolic Panel: Recent Labs  Lab 11/04/18 0826 11/05/18 0627 11/06/18 1351 11/07/18 0742 11/08/18 0440  NA 127* 130* 132* 133* 135  K 4.9 4.9 4.7 4.2 4.1  CL 85* 90* 88* 92* 96*  CO2 34* 31 35* 36* 33*  GLUCOSE 94 116* 151* 87 103*  BUN 28* 35* 41* 31* 28*  CREATININE 0.82 0.85 0.91 0.81 0.66  CALCIUM 9.9 9.7 9.6 9.3 9.1  MG 2.3  --   --   --  2.1  PHOS 3.4  --   --   --  2.7   Liver Function Tests: Recent Labs  Lab 11/02/18 1200 11/03/18 0431 11/04/18 0826  AST _1 ALT _2 ALKPHOS 75 76 87  BILITOT 0.5 0.4 0.4  PROT 7.1 6.7 7.1  ALBUMIN 3.7 3.4* 3.5   No results for input(s): LIPASE, AMYLASE in the last 168 hours. No results for input(s): AMMONIA in the last 168 hours. CBC: Recent Labs  Lab 11/02/18 1200 11/03/18 0431 11/04/18 0826 11/07/18 0742 11/08/18 0440   WBC 2.9* 2.5* 5.8 4.9 6.7  NEUTROABS 1.3*  --  4.8 3.7  --   HGB 13.0 13.4 14.0 11.6* 11.0*  HCT 41.0 41.5 43.5 39.1 36.6  MCV 92.6 89.8 91.0 97.8 96.6  PLT 331 329 312 292 309   Cardiac Enzymes: Recent Labs  Lab 11/02/18 1707 11/02/18 2217 11/03/18 1504  TROPONINI 0.11* 0.13* 0.08*   BNP: Invalid input(s): POCBNP CBG: No results for input(s): GLUCAP in the last 168 hours. D-Dimer No results for input(s): DDIMER in the last 72 hours. Hgb A1c No results for input(s): HGBA1C in the last 72 hours. Lipid Profile No results for input(s): CHOL, HDL, LDLCALC, TRIG, CHOLHDL, LDLDIRECT in the last 72 hours. Thyroid function studies No results for input(s): TSH, T4TOTAL, T3FREE,  THYROIDAB in the last 72 hours.  Invalid input(s): FREET3 Anemia work up No results for input(s): VITAMINB12, FOLATE, FERRITIN, TIBC, IRON, RETICCTPCT in the last 72 hours. Urinalysis    Component Value Date/Time   COLORURINE COLORLESS (A) 11/02/2018 1533   APPEARANCEUR CLEAR 11/02/2018 1533   LABSPEC 1.004 (L) 11/02/2018 1533   LABSPEC 1.010 10/21/2015 1135   PHURINE 8.0 11/02/2018 1533   GLUCOSEU NEGATIVE 11/02/2018 1533   GLUCOSEU Negative 10/21/2015 1135   HGBUR NEGATIVE 11/02/2018 1533   BILIRUBINUR NEGATIVE 11/02/2018 1533   BILIRUBINUR Negative 10/21/2015 [place] 11/02/2018 1533   PROTEINUR NEGATIVE 11/02/2018 1533   UROBILINOGEN 0.2 10/21/2015 1135   NITRITE NEGATIVE 11/02/2018 1533   LEUKOCYTESUR NEGATIVE 11/02/2018 1533   LEUKOCYTESUR Negative 10/21/2015 1135   Sepsis Labs Invalid input(s): PROCALCITONIN,  WBC,  LACTICIDVEN Microbiology No results found for this or any previous visit (from the past 240 hour(s)).  Time spent: 30 min  SIGNED:   Marylu Lund, MD  [facility] 11/08/2018, 2:28 PM  If 7PM-7AM, please contact night-coverage

## 2018-11-27 ENCOUNTER — Emergency Department (HOSPITAL_COMMUNITY): Payer: Medicare Other

## 2018-11-27 ENCOUNTER — Encounter (HOSPITAL_COMMUNITY): Payer: Self-pay | Admitting: Obstetrics and Gynecology

## 2018-11-27 ENCOUNTER — Emergency Department (HOSPITAL_COMMUNITY)
Admission: EM | Admit: 2018-11-27 | Discharge: 2018-11-27 | Disposition: A | Discharge status: Home / Self Care | Payer: Medicare Other | Attending: Emergency Medicine | Admitting: Emergency Medicine

## 2018-11-27 ENCOUNTER — Other Ambulatory Visit: Payer: Self-pay

## 2018-11-27 DIAGNOSIS — R531 Weakness: Secondary | ICD-10-CM | POA: Insufficient documentation

## 2018-11-27 DIAGNOSIS — Z853 Personal history of malignant neoplasm of breast: Secondary | ICD-10-CM | POA: Diagnosis not present

## 2018-11-27 DIAGNOSIS — I10 Essential (primary) hypertension: Secondary | ICD-10-CM | POA: Diagnosis not present

## 2018-11-27 DIAGNOSIS — F0390 Unspecified dementia without behavioral disturbance: Secondary | ICD-10-CM | POA: Diagnosis not present

## 2018-11-27 DIAGNOSIS — Z79899 Other long term (current) drug therapy: Secondary | ICD-10-CM | POA: Diagnosis not present

## 2018-11-27 LAB — BASIC METABOLIC PANEL
Anion gap: 10 (ref 5–15)
BUN: 19 mg/dL (ref 8–23)
CO2: 30 mmol/L (ref 22–32)
Calcium: 9.2 mg/dL (ref 8.9–10.3)
Chloride: 95 mmol/L — ABNORMAL LOW (ref 98–111)
Creatinine, Ser: 0.6 mg/dL (ref 0.44–1.00)
GFR calc Af Amer: >60 mL/min (ref >60)
Glucose, Bld: 95 mg/dL (ref 70–99)
POTASSIUM: 4.5 mmol/L (ref 3.5–5.1)
Sodium: 135 mmol/L (ref 135–145)

## 2018-11-27 LAB — URINALYSIS, ROUTINE W REFLEX MICROSCOPIC
Bilirubin Urine: NEGATIVE
Glucose, UA: NEGATIVE mg/dL
Hgb urine dipstick: NEGATIVE
Ketones, ur: NEGATIVE mg/dL
LEUKOCYTE UA: NEGATIVE
Nitrite: NEGATIVE
Protein, ur: NEGATIVE mg/dL
Specific Gravity, Urine: 1.012 (ref 1.005–1.030)
pH: 7 (ref 5.0–8.0)

## 2018-11-27 LAB — CBC
HCT: 37.4 % (ref 36.0–46.0)
Hemoglobin: 11 g/dL — ABNORMAL LOW (ref 12.0–15.0)
MCH: 29.2 pg (ref 26.0–34.0)
MCHC: 29.4 g/dL — ABNORMAL LOW (ref 30.0–36.0)
MCV: 99.2 fL (ref 80.0–100.0)
Platelets: 268 10*3/uL (ref 150–400)
RBC: 3.77 MIL/uL — ABNORMAL LOW (ref 3.87–5.11)
RDW: 14.3 % (ref 11.5–15.5)
WBC: 6.8 10*3/uL (ref 4.0–10.5)
nRBC: 0 % (ref 0.0–0.2)

## 2018-11-27 MED ORDER — ACETAMINOPHEN 500 MG PO TABS
1000.0000 mg | ORAL_TABLET | Freq: Once | ORAL | Status: AC
  Administered 2018-11-27: 1000 mg via ORAL
  Filled 2018-11-27: qty 2

## 2018-11-27 NOTE — ED Notes (Signed)
RN stuck patient for IV attempt but was unable to start an IV. Blood work obtained and sent to lab.  Patient reports to RN that she has not been eating and has had a decreased appetite.  Patient's daughter reports the facility told her patient has pneumonia again.

## 2018-11-27 NOTE — ED Notes (Signed)
Notified [facility] for transportation for patient to [facility].

## 2018-11-27 NOTE — ED Notes (Addendum)
Placed on purewick. Will collect urine sample when pt voids.

## 2018-11-27 NOTE — ED Notes (Signed)
[facility] called for transport RN called report to [facility]

## 2018-11-27 NOTE — ED Triage Notes (Signed)
Per EMS: Pt is coming from [facility] with a report of weakness x3 days. Pt is alert and is oriented to baseline per family and facility.  Pt is on 2L O2 at all times.  Pt started coughing when EMT was assessing lung sounds.  Crackles reported in upper lobes and air movement reportedly heard in lower lung sounds.

## 2018-11-27 NOTE — Care Management (Signed)
[facility] ED CM received call from [place] at [facility] ED concerning consult for patient, CM attempted to contact [facility] EDP, noted in record patient appears to be from a SNF, CM contacted [facility] CSW to follow up with patient transitional care needs.

## 2018-11-27 NOTE — Progress Notes (Addendum)
Consult request has been received. CSW attempting to follow up at present time.  Per EPD pt was sent home with no oxygen and family desired information on long-term placement.  CSW called Pamela House in admissions with Pamela House at ph: [phone] and Pamela House stated that the family had "discharged" the pt themselves and although the pt was not scheduled to D/C until tomorrow the family came and cleaned out the pt's room of her belongings and stated pt would not be back.  Pamela House stated that the pt has [facility] Medicare so if the family wanted the pt to return for one night that might be possible payor-wise.  Per Pamela House, pt was not sent home with oxygen because the pt was sent directly to the hospital rather than home, that the [facility] agency is [place] and that oxygen will need to be set up by [facility] ED.  5:56 PM' CSW spoke to the [place] at [facility] who stated pt's family had wanted the pt to leave since lat week when visitor restrictions had taken place and had consistently attempted to override restrictions to be able to visit with the family and had wanted to leave with the pt since then and had finally D/C'd the pt today. CSW will continue to follow for D/C needs.  6:24 PM Pt's family stated they are agreeable to return for one night to [facility] and [place] confirmed with Pamela House in admissions and with the social worker that pt can return.  CSW spoke to [place] and stated family was agreeable and is returning and Pamela House voiced understanding that pt is returning and provided accepting info to the [place].  RN updated and family was appreciative and thanked the CSW.  CSW will continue to follow for D/C needs.  Pamela Guild. Pawel Soules, LCSW, LCAS, CSI Transitions of Care Clinical Social Worker Care Coordination Department Ph: [phone]

## 2018-11-27 NOTE — ED Notes (Signed)
Attempted to call report to [facility] x2

## 2018-11-27 NOTE — Progress Notes (Signed)
CSW spoke to [place] in admissions. Pt has been accepted for return by: Isaias Cowman Number for report is: [phone] Pt's unit/room/bed number will be: Room 35 Accepting physician: SNF MD  Pt can arrive ASAP on 11/27/18  CSW will update RN and EDP.  Alphonse Guild. Silviano Neuser, LCSW, LCAS, CSI Clinical Social Worker Ph: [phone]

## 2018-11-27 NOTE — ED Provider Notes (Signed)
[facility] DEPT Provider Note   CSN: [id number] Arrival date & time: 11/27/18  1353    History   Chief Complaint Chief Complaint  Patient presents with  . Weakness    HPI Pamela House is a 85 y.o. female.     The history is provided by the patient.  Weakness  Severity:  Mild Onset quality:  Gradual Timing:  Intermittent Progression:  Waxing and waning Chronicity:  Recurrent Context: recent infection (recent pneumonia, has been at rehab facility. )   Relieved by:  Nothing Worsened by:  Nothing Associated symptoms: no abdominal pain, no anorexia, no arthralgias, no ataxia, no chest pain, no cough, no difficulty walking, no drooling, no dysuria, no numbness in extremities, no fever, no foul-smelling urine, no frequency, no lethargy, no loss of consciousness, no myalgias, no seizures, no shortness of breath, no stroke symptoms and no vomiting   Risk factors comment:  Leukemia, chronic lung disease   Past Medical History:  Diagnosis Date  . Allergic rhinitis   . Anxiety   . Breast cancer ([place]) 2016   left breast  . DDD (degenerative disc disease)   . Dementia ([place])   . Dyslipidemia   . GERD (gastroesophageal reflux disease)   . Hypertension   . Leukemia ([place]) 2010  . Osteoporosis   . Personal history of chemotherapy 2016  . Personal history of radiation therapy 2016  . Radiation 04/30/15-05/1515   Left breast  . Vitamin D deficiency     Patient Active Problem List   Diagnosis Date Noted  . Cough   . Protein-calorie malnutrition, severe 11/06/2018  . IPF (idiopathic pulmonary fibrosis) ([place])   . Palliative care encounter   . DNR (do not resuscitate)   . Dyspnea 11/02/2018  . Hyponatremia 11/02/2018  . Cardiomyopathy due to chemotherapy ([place]) 10/02/2015  . Diarrhea 06/20/2015  . Malignant neoplasm of upper-outer quadrant of left breast in female, estrogen receptor negative ([place]) 02/07/2015  . Chronic cough 06/19/2013  . ILD  (interstitial lung disease) ([place]) 06/19/2013  . Hypertension 02/21/2013  . CML (chronic myelocytic leukemia) ([place]) 05/23/2012    Past Surgical History:  Procedure Laterality Date  . BACK SURGERY  1984  . BREAST LUMPECTOMY Left 2016  . BREAST LUMPECTOMY WITH RADIOACTIVE SEED AND SENTINEL LYMPH NODE BIOPSY Left 02/27/2015   Procedure: LEFT BREAST LUMPECTOMY WITH RADIOACTIVE SEED AND SENTINEL LYMPH NODE BIOPSY;  Surgeon: Autumn Messing III, MD;  Location: [facility];  Service: General;  Laterality: Left;     OB History   No obstetric history on file.      Home Medications    Prior to Admission medications   Medication Sig Start Date End Date Taking? Authorizing Provider  ALPRAZolam Duanne Moron) 0.5 MG tablet Take 1 tablet (0.5 mg total) by mouth 2 (two) times daily as needed for anxiety. Prescription needed for skilled nursing 11/08/18  Yes Donne Hazel, MD  amLODipine (NORVASC) 5 MG tablet Take 5 mg by mouth daily. 10/25/18  Yes [provider]  aspirin 81 MG tablet Take 81 mg by mouth daily.   Yes [provider]  azithromycin (ZITHROMAX) 250 MG tablet 1 tab po daily x 3 more days, then stop, zero refills Patient taking differently: Take 250 mg by mouth daily. 11/25/18-11/28/18 11/08/18  Yes Donne Hazel, MD  benzonatate (TESSALON) 200 MG capsule Take 1 capsule (200 mg total) by mouth 3 (three) times daily. 11/08/18  Yes Donne Hazel, MD  carvedilol (COREG)  25 MG tablet Take 1 tablet (25 mg total) by mouth 2 (two) times daily. 10/19/17  Yes Magrinat, [name], MD  donepezil (ARICEPT) 5 MG tablet Take 5 mg by mouth at bedtime.   Yes [provider]  fluticasone (FLONASE) 50 MCG/ACT nasal spray Place 2 sprays into both nostrils daily. 09/10/18  Yes [provider]  guaiFENesin (ROBITUSSIN) 100 MG/5ML liquid Take 200 mg by mouth every 6 (six) hours as needed for cough.   Yes [provider]  latanoprost (XALATAN) 0.005 % ophthalmic  solution Place 1 drop into both eyes at bedtime. 09/10/18  Yes [provider]  mirtazapine (REMERON) 15 MG tablet  07/13/17  Yes [provider]  omeprazole (PRILOSEC) 20 MG capsule TAKE 1 CAPSULE BY MOUTH TWICE A DAY 10/25/18  Yes Magrinat, [name], MD  polyethylene glycol (MIRALAX / GLYCOLAX) packet Take 17 g by mouth daily.   Yes [provider]  predniSONE (DELTASONE) 10 MG tablet Take 1 tablet (10 mg total) by mouth daily. Taper dose: 24m po daily x 3 days, then 239mpo daily x 3 days, then 104mo daily x 3 days, then 5mg88m daily x 2 days, then stop, zero refills Patient taking differently: Take 40 mg by mouth daily. Taper dose: 40mg42mdaily x 3 days, then 20mg 44maily x 3 days, then 10mg p36mily x 3 days, then 5mg po 54mly x 2 days, then stop, zero refills 11/08/18  Yes Chiu, StDonne Hazeltamin D, Ergocalciferol, (DRISDOL) 50000 units CAPS capsule Take 50,000 Units by mouth every 30 (thirty) days.   Yes [provider]    Family History No family history on file.  Social History Social History   Tobacco Use  . Smoking status: Never Smoker  . Smokeless tobacco: Never Used  Substance Use Topics  . Alcohol use: No    Alcohol/week: 0.0 standard drinks  . Drug use: No     Allergies   Ace inhibitors; Hyzaar [losartan potassium-hctz]; Lipitor [atorvastatin]; Losartan; and Tiazac [diltiazem hcl er beads]   Review of Systems Review of Systems  Constitutional: Negative for chills and fever.  HENT: Negative for drooling, ear pain and sore throat.   Eyes: Negative for pain and visual disturbance.  Respiratory: Negative for cough and shortness of breath.   Cardiovascular: Negative for chest pain and palpitations.  Gastrointestinal: Negative for abdominal pain, anorexia and vomiting.  Genitourinary: Negative for dysuria, frequency and hematuria.  Musculoskeletal: Negative for arthralgias, back pain and myalgias.  Skin: Negative for color  change and rash.  Neurological: Positive for weakness. Negative for seizures, loss of consciousness and syncope.  All other systems reviewed and are negative.    Physical Exam Updated Vital Signs  ED Triage Vitals  Enc Vitals Group     BP 11/27/18 1407 110/64     Pulse Rate 11/27/18 1407 70     Resp 11/27/18 1407 (!) 31     Temp 11/27/18 1407 98.2 F (36.8 C)     Temp Source 11/27/18 1407 Oral     SpO2 11/27/18 1406 100 %     Weight --      Height --      Head Circumference --      Peak Flow --      Pain Score 11/27/18 1408 0     Pain Loc --      Pain Edu? --      Excl. in GC? --[place]  Physical Exam Vitals signs and nursing note reviewed.  Constitutional:      General: She is not in acute distress.    Appearance: She is well-developed.  HENT:     Head: Normocephalic and atraumatic.     Nose: Nose normal.     Mouth/Throat:     Mouth: Mucous membranes are moist.  Eyes:     Extraocular Movements: Extraocular movements intact.     Conjunctiva/sclera: Conjunctivae normal.     Pupils: Pupils are equal, round, and reactive to light.  Neck:     Musculoskeletal: Normal range of motion and neck supple.  Cardiovascular:     Rate and Rhythm: Normal rate and regular rhythm.     Pulses: Normal pulses.     Heart sounds: Normal heart sounds. No murmur.  Pulmonary:     Effort: Pulmonary effort is normal. No respiratory distress.     Comments: Coarse breath sounds throughout Abdominal:     General: There is no distension.     Palpations: Abdomen is soft.     Tenderness: There is no abdominal tenderness.  Musculoskeletal:     Right lower leg: No edema.     Left lower leg: No edema.  Skin:    General: Skin is warm and dry.     Capillary Refill: Capillary refill takes less than 2 seconds.  Neurological:     General: No focal deficit present.     Mental Status: She is alert.  Psychiatric:        Mood and Affect: Mood normal.      ED Treatments / Results  Labs (all labs  ordered are listed, but only abnormal results are displayed) Labs Reviewed  BASIC METABOLIC PANEL - Abnormal; Notable for the following components:      Result Value   Chloride 95 (*)    All other components within normal limits  CBC - Abnormal; Notable for the following components:   RBC 3.77 (*)    Hemoglobin 11.0 (*)    MCHC 29.4 (*)    All other components within normal limits  URINE CULTURE  URINALYSIS, ROUTINE W REFLEX MICROSCOPIC    EKG EKG Interpretation  Date/Time:  Monday November 27 2018 14:34:02 EDT Ventricular Rate:  69 PR Interval:    QRS Duration: 85 QT Interval:  409 QTC Calculation: 439 R Axis:   114 Text Interpretation:  Sinus rhythm Anteroseptal infarct, age indeterminate Confirmed by Lennice Sites [phone]) on 11/27/2018 3:08:51 PM   Radiology Dg Chest 2 View  Result Date: 11/27/2018 CLINICAL DATA:  Weakness for 3 days. EXAM: CHEST - 2 VIEW COMPARISON:  Chest radiographs 11/02/2018 and CT 11/06/2018 FINDINGS: The cardiomediastinal silhouette is unchanged with mild cardiomegaly again noted. There is chronic elevation of the right hemidiaphragm. Lung volumes are unchanged with similar appearance of extensive chronic interstitial lung disease compared to the prior radiographs. No definite acute airspace consolidation, pleural effusion, pneumothorax is identified. No acute osseous abnormality is seen. IMPRESSION: Chronic interstitial lung disease without evidence of acute abnormality. Electronically Signed   By: Logan Bores M.D.   On: 11/27/2018 15:52    Procedures Procedures (including critical care time)  Medications Ordered in ED Medications  acetaminophen (TYLENOL) tablet 1,000 mg (1,000 mg Oral Given 11/27/18 1613)     Initial Impression / Assessment and Plan / ED Course  I have reviewed the triage vital signs and the nursing notes.  Pertinent labs & imaging results that were available during my care of the patient were  reviewed by me and considered in my  medical decision making (see chart for details).     Pamela House is an 85 year old female with history of interstitial lung disease, leukemia who presents to the ED with generalized weakness.  Patient with normal vitals.  No fever.  Patient on 2 L of oxygen at baseline.  Has had decreased appetite.  Patient has been in rehab after having an episode of pneumonia.  Patient was sent home from rehab but family came here as they think that she is not doing better.  Overall she appears well.  Has normal vitals.  Lung sounds overall coarse which is likely baseline.  Patient has no chest pain, no shortness of breath, no fever.  Lab work showed no significant anemia, electrolyte abnormality, kidney injury.  Chest x-ray shows no signs of pneumonia.  Urinalysis is pending.  Will engage social work and case management for help.  Patient is supposed to have oxygen at home but unaware if this is going to be available to them.  Likely to be discharged home but will await social work and case management for help.  Urinalysis also pending.  UA is unremarkable.  Patient was able to eat without any issues.  Patient was arranged to go back to her rehab until she can be discharged to home with oxygen.  Given return precautions.  This chart was dictated using voice recognition software.  Despite best efforts to proofread,  errors can occur which can change the documentation meaning.    Final Clinical Impressions(s) / ED Diagnoses   Final diagnoses:  Generalized weakness    ED Discharge Orders    None       Lennice Sites, DO 11/27/18 1831

## 2018-11-28 LAB — URINE CULTURE: Culture: NO GROWTH

## 2018-12-07 ENCOUNTER — Inpatient Hospital Stay (HOSPITAL_COMMUNITY)
Admission: EM | Admit: 2018-12-07 | Discharge: 2018-12-09 | DRG: 196 | Disposition: A | Discharge status: Home Health | Payer: Medicare Other | Attending: Internal Medicine | Admitting: Internal Medicine

## 2018-12-07 ENCOUNTER — Emergency Department (HOSPITAL_COMMUNITY): Payer: Medicare Other

## 2018-12-07 ENCOUNTER — Encounter (HOSPITAL_COMMUNITY): Payer: Self-pay | Admitting: Obstetrics and Gynecology

## 2018-12-07 ENCOUNTER — Inpatient Hospital Stay (HOSPITAL_COMMUNITY): Payer: Medicare Other

## 2018-12-07 ENCOUNTER — Other Ambulatory Visit: Payer: Self-pay

## 2018-12-07 DIAGNOSIS — Z7982 Long term (current) use of aspirin: Secondary | ICD-10-CM | POA: Diagnosis not present

## 2018-12-07 DIAGNOSIS — Z888 Allergy status to other drugs, medicaments and biological substances status: Secondary | ICD-10-CM

## 2018-12-07 DIAGNOSIS — E559 Vitamin D deficiency, unspecified: Secondary | ICD-10-CM | POA: Diagnosis present

## 2018-12-07 DIAGNOSIS — C9210 Chronic myeloid leukemia, BCR/ABL-positive, not having achieved remission: Secondary | ICD-10-CM | POA: Diagnosis present

## 2018-12-07 DIAGNOSIS — F0390 Unspecified dementia without behavioral disturbance: Secondary | ICD-10-CM | POA: Diagnosis present

## 2018-12-07 DIAGNOSIS — J309 Allergic rhinitis, unspecified: Secondary | ICD-10-CM | POA: Diagnosis present

## 2018-12-07 DIAGNOSIS — Z9221 Personal history of antineoplastic chemotherapy: Secondary | ICD-10-CM

## 2018-12-07 DIAGNOSIS — I2721 Secondary pulmonary arterial hypertension: Secondary | ICD-10-CM | POA: Diagnosis present

## 2018-12-07 DIAGNOSIS — J9621 Acute and chronic respiratory failure with hypoxia: Secondary | ICD-10-CM | POA: Diagnosis present

## 2018-12-07 DIAGNOSIS — K219 Gastro-esophageal reflux disease without esophagitis: Secondary | ICD-10-CM | POA: Diagnosis present

## 2018-12-07 DIAGNOSIS — J84112 Idiopathic pulmonary fibrosis: Principal | ICD-10-CM | POA: Diagnosis present

## 2018-12-07 DIAGNOSIS — Z66 Do not resuscitate: Secondary | ICD-10-CM | POA: Diagnosis present

## 2018-12-07 DIAGNOSIS — R0902 Hypoxemia: Secondary | ICD-10-CM | POA: Diagnosis not present

## 2018-12-07 DIAGNOSIS — Z923 Personal history of irradiation: Secondary | ICD-10-CM | POA: Diagnosis not present

## 2018-12-07 DIAGNOSIS — F419 Anxiety disorder, unspecified: Secondary | ICD-10-CM | POA: Diagnosis present

## 2018-12-07 DIAGNOSIS — I10 Essential (primary) hypertension: Secondary | ICD-10-CM | POA: Diagnosis present

## 2018-12-07 DIAGNOSIS — Z9981 Dependence on supplemental oxygen: Secondary | ICD-10-CM | POA: Diagnosis not present

## 2018-12-07 DIAGNOSIS — Z79899 Other long term (current) drug therapy: Secondary | ICD-10-CM

## 2018-12-07 DIAGNOSIS — R0602 Shortness of breath: Secondary | ICD-10-CM | POA: Diagnosis not present

## 2018-12-07 DIAGNOSIS — M810 Age-related osteoporosis without current pathological fracture: Secondary | ICD-10-CM | POA: Diagnosis present

## 2018-12-07 DIAGNOSIS — Z853 Personal history of malignant neoplasm of breast: Secondary | ICD-10-CM

## 2018-12-07 DIAGNOSIS — E785 Hyperlipidemia, unspecified: Secondary | ICD-10-CM | POA: Diagnosis present

## 2018-12-07 DIAGNOSIS — J849 Interstitial pulmonary disease, unspecified: Secondary | ICD-10-CM | POA: Diagnosis present

## 2018-12-07 DIAGNOSIS — I509 Heart failure, unspecified: Secondary | ICD-10-CM

## 2018-12-07 DIAGNOSIS — Z515 Encounter for palliative care: Secondary | ICD-10-CM | POA: Diagnosis present

## 2018-12-07 LAB — CBC WITH DIFFERENTIAL/PLATELET
Abs Immature Granulocytes: 0.03 10*3/uL (ref 0.00–0.07)
Basophils Absolute: 0 10*3/uL (ref 0.0–0.1)
Basophils Relative: 1 %
Eosinophils Absolute: 0.1 10*3/uL (ref 0.0–0.5)
Eosinophils Relative: 2 %
HCT: 37.2 % (ref 36.0–46.0)
Hemoglobin: 11.6 g/dL — ABNORMAL LOW (ref 12.0–15.0)
Immature Granulocytes: 1 %
Lymphocytes Relative: 12 %
Lymphs Abs: 0.8 10*3/uL (ref 0.7–4.0)
MCH: 29.2 pg (ref 26.0–34.0)
MCHC: 31.2 g/dL (ref 30.0–36.0)
MCV: 93.7 fL (ref 80.0–100.0)
Monocytes Absolute: 0.4 10*3/uL (ref 0.1–1.0)
Monocytes Relative: 7 %
Neutro Abs: 5.1 10*3/uL (ref 1.7–7.7)
Neutrophils Relative %: 77 %
Platelets: 238 10*3/uL (ref 150–400)
RBC: 3.97 MIL/uL (ref 3.87–5.11)
RDW: 13.9 % (ref 11.5–15.5)
WBC: 6.5 10*3/uL (ref 4.0–10.5)
nRBC: 0 % (ref 0.0–0.2)

## 2018-12-07 LAB — BASIC METABOLIC PANEL
Anion gap: 10 (ref 5–15)
BUN: 16 mg/dL (ref 8–23)
CO2: 30 mmol/L (ref 22–32)
Calcium: 9.9 mg/dL (ref 8.9–10.3)
Chloride: 95 mmol/L — ABNORMAL LOW (ref 98–111)
Creatinine, Ser: 0.59 mg/dL (ref 0.44–1.00)
GFR calc Af Amer: >60 mL/min (ref >60)
GFR calc non Af Amer: >60 mL/min (ref >60)
Glucose, Bld: 94 mg/dL (ref 70–99)
Potassium: 4.1 mmol/L (ref 3.5–5.1)
Sodium: 135 mmol/L (ref 135–145)

## 2018-12-07 LAB — TROPONIN I: Troponin I: 0.03 ng/mL (ref <0.03)

## 2018-12-07 LAB — BRAIN NATRIURETIC PEPTIDE: B Natriuretic Peptide: 891.1 pg/mL — ABNORMAL HIGH (ref 0.0–100.0)

## 2018-12-07 MED ORDER — ALBUTEROL SULFATE HFA 108 (90 BASE) MCG/ACT IN AERS
6.0000 | INHALATION_SPRAY | Freq: Once | RESPIRATORY_TRACT | Status: AC
  Administered 2018-12-07: 6 via RESPIRATORY_TRACT
  Filled 2018-12-07: qty 6.7

## 2018-12-07 MED ORDER — SODIUM CHLORIDE 0.9 % IV SOLN
1.0000 g | Freq: Once | INTRAVENOUS | Status: AC
  Administered 2018-12-07: 1 g via INTRAVENOUS
  Filled 2018-12-07: qty 1

## 2018-12-07 MED ORDER — FUROSEMIDE 10 MG/ML IJ SOLN
60.0000 mg | Freq: Once | INTRAMUSCULAR | Status: AC
  Administered 2018-12-07: 60 mg via INTRAVENOUS
  Filled 2018-12-07: qty 8

## 2018-12-07 MED ORDER — PREDNISONE 20 MG PO TABS
40.0000 mg | ORAL_TABLET | Freq: Once | ORAL | Status: AC
  Administered 2018-12-07: 40 mg via ORAL
  Filled 2018-12-07: qty 2

## 2018-12-07 MED ORDER — LEVOFLOXACIN IN D5W 500 MG/100ML IV SOLN
500.0000 mg | Freq: Every day | INTRAVENOUS | Status: DC
  Administered 2018-12-08: 500 mg via INTRAVENOUS
  Filled 2018-12-07: qty 100

## 2018-12-07 MED ORDER — IOHEXOL 350 MG/ML SOLN
100.0000 mL | Freq: Once | INTRAVENOUS | Status: AC | PRN
  Administered 2018-12-07: 80 mL via INTRAVENOUS

## 2018-12-07 NOTE — ED Notes (Signed)
Bed: UV75 Expected date:  Expected time:  Means of arrival:  Comments: EMS-low SATS-COPD

## 2018-12-07 NOTE — ED Notes (Signed)
PT stated she needed to go to the bathroom. As soon as patient stood up her O2 saturation became 85% on RA. With ambulation, patient's O2 came down to 76%. Pt placed back on NBR until O2 saturation returned to normal. Pt resting. MD made aware of event

## 2018-12-07 NOTE — H&P (Signed)
History and Physical   Pamela House BVQ:[id number] DOB: 06-04-1930 DOA: 12/07/2018  Referring MD/NP/PA: Dr. Wilson Singer  PCP: Wenda Low, MD   Outpatient Specialists: None  Patient coming from: Home  Chief Complaint: Shortness of  HPI: Pamela House is a 85 y.o. female with medical history significant of interstitial fibrosis of the lung, chronic respiratory failure, dementia, breast cancer, GERD, hypertension, CML, who was in the hospital February 28 with acute on chronic respiratory failure.  She was discharged home on prednisone Dosepak.  Patient has completed treatment.  She came to the ER today due to significant worsening of shortness of breath.  Patient is on 2 L of oxygen at home but oxygen sats at rest was 89% on the 2 L and 76% with activity.  Patient denied any fever or chills.  Denied any sick contacts.  Denied any traveling.  She appears to have increased oxygen demand and is being admitted with acute on chronic respiratory failure..  ED Course: Temperature 97.6 blood pressure 148/79 pulse 78 respirate of 19 oxygen sats 89% on 2 L.  BNP is 891.  CBC and chemistry appeared to be within normal.  Chest x-ray showed pulmonary fibrosis with possible infiltrates.  No edema.  Review of Systems: As per HPI otherwise 10 point review of systems negative.    Past Medical History:  Diagnosis Date  . Allergic rhinitis   . Anxiety   . Breast cancer ([place]) 2016   left breast  . DDD (degenerative disc disease)   . Dementia ([place])   . Dyslipidemia   . GERD (gastroesophageal reflux disease)   . Hypertension   . Leukemia ([place]) 2010  . Osteoporosis   . Personal history of chemotherapy 2016  . Personal history of radiation therapy 2016  . Radiation 04/30/15-05/1515   Left breast  . Vitamin D deficiency     Past Surgical History:  Procedure Laterality Date  . BACK SURGERY  1984  . BREAST LUMPECTOMY Left 2016  . BREAST LUMPECTOMY WITH RADIOACTIVE SEED AND SENTINEL LYMPH NODE BIOPSY  Left 02/27/2015   Procedure: LEFT BREAST LUMPECTOMY WITH RADIOACTIVE SEED AND SENTINEL LYMPH NODE BIOPSY;  Surgeon: Autumn Messing III, MD;  Location: [facility];  Service: General;  Laterality: Left;     reports that she has never smoked. She has never used smokeless tobacco. She reports that she does not drink alcohol or use drugs.  Allergies  Allergen Reactions  . Ace Inhibitors Cough  . Hyzaar [Losartan Potassium-Hctz] Cough  . Lipitor [Atorvastatin]     Muscle fatigue  . Losartan Cough  . Tiazac [Diltiazem Hcl Er Beads] Cough    No family history on file.   Prior to Admission medications   Medication Sig Start Date End Date Taking? Authorizing Provider  amLODipine (NORVASC) 5 MG tablet Take 5 mg by mouth daily. 10/25/18  Yes [provider]  aspirin 81 MG tablet Take 81 mg by mouth daily.   Yes [provider]  benzonatate (TESSALON) 200 MG capsule Take 1 capsule (200 mg total) by mouth 3 (three) times daily. 11/08/18  Yes Donne Hazel, MD  carvedilol (COREG) 25 MG tablet Take 1 tablet (25 mg total) by mouth 2 (two) times daily. 10/19/17  Yes Magrinat, [name], MD  donepezil (ARICEPT) 5 MG tablet Take 5 mg by mouth at bedtime.   Yes [provider]  fluticasone (FLONASE) 50 MCG/ACT nasal spray Place 2 sprays into both nostrils daily. 09/10/18  Yes [provider]  latanoprost (XALATAN) 0.005 % ophthalmic solution Place 1 drop into both eyes at bedtime. 09/10/18  Yes [provider]  mirtazapine (REMERON) 15 MG tablet Take 15 mg by mouth at bedtime.  07/13/17  Yes [provider]  omeprazole (PRILOSEC) 20 MG capsule TAKE 1 CAPSULE BY MOUTH TWICE A DAY 10/25/18  Yes Magrinat, [name], MD  polyethylene glycol (MIRALAX / GLYCOLAX) packet Take 17 g by mouth daily.   Yes [provider]  Vitamin D, Ergocalciferol, (DRISDOL) 50000 units CAPS capsule Take 50,000 Units by mouth every 30 (thirty) days.   Yes [provider]  ALPRAZolam (XANAX) 0.5 MG tablet Take 1 tablet (0.5 mg total) by mouth 2 (two) times daily as needed for anxiety. Prescription needed for skilled nursing 11/08/18   Donne Hazel, MD  azithromycin [facility]) 250 MG tablet 1 tab po daily x 3 more days, then stop, zero refills Patient not taking: Reported on 12/07/2018 11/08/18   Donne Hazel, MD  guaiFENesin (ROBITUSSIN) 100 MG/5ML liquid Take 200 mg by mouth every 6 (six) hours as needed for cough.    [provider]  predniSONE (DELTASONE) 10 MG tablet Take 1 tablet (10 mg total) by mouth daily. Taper dose: 20m po daily x 3 days, then 211mpo daily x 3 days, then 1083mo daily x 3 days, then 5mg72m daily x 2 days, then stop, zero refills Patient not taking: Reported on 12/07/2018 11/08/18   ChiuDonne Hazel    Physical Exam: Vitals:   12/07/18 1529 12/07/18 1746 12/07/18 1934  BP:  (!) 148/79 126/81  Pulse:  65 78  Resp:  13 19  Temp:   97.6 F (36.4 C)  TempSrc:   Oral  SpO2:  93% 93%  Weight: 39 kg    Height: _0  (1.6 m)        Constitutional: NAD, calm, cachectic and chronically ill looking Vitals:   12/07/18 1529 12/07/18 1746 12/07/18 1934  BP:  (!) 148/79 126/81  Pulse:  65 78  Resp:  13 19  Temp:   97.6 F (36.4 C)  TempSrc:   Oral  SpO2:  93% 93%  Weight: 39 kg    Height: _1  (1.6 m)     Eyes: [name], lids and conjunctivae normal ENMT: Mucous membranes are moist. Posterior pharynx clear of any exudate or lesions.Normal dentition.  Neck: normal, supple, no masses, no thyromegaly Respiratory: Decreased air entry with diffuse crackles.  No significant wheeze. Normal respiratory effort. No accessory muscle use.  Cardiovascular: Regular rate and rhythm, no murmurs / rubs / gallops. No extremity edema. 2+ pedal pulses. No carotid bruits.  Abdomen: no tenderness, no masses palpated. No hepatosplenomegaly. Bowel sounds positive.  Musculoskeletal: no clubbing / cyanosis. No joint deformity  upper and lower extremities. Good ROM, no contractures. Normal muscle tone.  Skin: no rashes, lesions, ulcers. No induration Neurologic: CN 2-12 grossly intact. Sensation intact, DTR normal. Strength 5/5 in all 4.  Psychiatric: Confused but not agitated. Alert and oriented x 3. Normal mood.     Labs on Admission: I have personally reviewed following labs and imaging studies  CBC: Recent Labs  Lab 12/07/18 1559  WBC 6.5  NEUTROABS 5.1  HGB 11.6*  HCT 37.2  MCV 93.7  PLT 238 485asic Metabolic Panel: Recent Labs  Lab 12/07/18 1638  NA 135  K 4.1  CL 95*  CO2 30  GLUCOSE 94  BUN 16  CREATININE  0.59  CALCIUM 9.9   GFR: Estimated Creatinine Clearance: 31.1 mL/min (by C-G formula based on SCr of 0.59 mg/dL). Liver Function Tests: No results for input(s): AST, ALT, ALKPHOS, BILITOT, PROT, ALBUMIN in the last 168 hours. No results for input(s): LIPASE, AMYLASE in the last 168 hours. No results for input(s): AMMONIA in the last 168 hours. Coagulation Profile: No results for input(s): INR, PROTIME in the last 168 hours. Cardiac Enzymes: Recent Labs  Lab 12/07/18 1559  TROPONINI 0.03*   BNP (last 3 results) No results for input(s): PROBNP in the last 8760 hours. HbA1C: No results for input(s): HGBA1C in the last 72 hours. CBG: No results for input(s): GLUCAP in the last 168 hours. Lipid Profile: No results for input(s): CHOL, HDL, LDLCALC, TRIG, CHOLHDL, LDLDIRECT in the last 72 hours. Thyroid Function Tests: No results for input(s): TSH, T4TOTAL, FREET4, T3FREE, THYROIDAB in the last 72 hours. Anemia Panel: No results for input(s): VITAMINB12, FOLATE, FERRITIN, TIBC, IRON, RETICCTPCT in the last 72 hours. Urine analysis:    Component Value Date/Time   COLORURINE YELLOW 11/27/2018 1726   APPEARANCEUR CLEAR 11/27/2018 1726   LABSPEC 1.012 11/27/2018 1726   LABSPEC 1.010 10/21/2015 1135   PHURINE 7.0 11/27/2018 1726   GLUCOSEU NEGATIVE 11/27/2018 1726   GLUCOSEU  Negative 10/21/2015 1135   HGBUR NEGATIVE 11/27/2018 1726   BILIRUBINUR NEGATIVE 11/27/2018 1726   BILIRUBINUR Negative 10/21/2015 1135   KETONESUR NEGATIVE 11/27/2018 1726   PROTEINUR NEGATIVE 11/27/2018 1726   UROBILINOGEN 0.2 10/21/2015 1135   NITRITE NEGATIVE 11/27/2018 1726   LEUKOCYTESUR NEGATIVE 11/27/2018 1726   LEUKOCYTESUR Negative 10/21/2015 1135   Sepsis Labs: _0 (procalcitonin:4,lacticidven:4) )No results found for this or any previous visit (from the past 240 hour(s)).   Radiological Exams on Admission: Dg Chest 2 View  Result Date: 12/07/2018 CLINICAL DATA:  Dyspnea.  History of hypertension and breast cancer. EXAM: CHEST - 2 VIEW COMPARISON:  11/27/2018 and older exams. FINDINGS: Mild enlargement of the cardiac silhouette. No mediastinal or hilar masses. Lungs show heterogeneous coarse interstitial thickening with intervening hazy airspace type opacities, which predominate in the peripheral lungs and at the bases, without significant change from prior exams. No pleural effusion or pneumothorax. Skeletal structures are intact. IMPRESSION: 1. Advanced interstitial lung disease. Superimposed pneumonia is possible but not definitively seen. No evidence of pulmonary edema. 2. Mild stable cardiomegaly. Electronically Signed   By: Lajean Manes M.D.   On: 12/07/2018 18:10    EKG: Independently reviewed.  It shows sinus rhythm with low voltage EKG flattening of the T waves in the lateral leads but no significant ST changes.  Assessment/Plan Principal Problem:   Hypoxia Active Problems:   CML (chronic myelocytic leukemia) ([facility])   Hypertension   ILD (interstitial lung disease) ([place])   IPF (idiopathic pulmonary fibrosis) ([place])   DNR (do not resuscitate)     #1 acute on chronic respiratory failure with hypoxia: Patient has progressive pulmonary fibrosis which could be the cause.  Underlying pneumonia also likely.  Pulmonary embolism also likely.  We will get CT angiogram  of the chest.  Continue on oxygen.  Antibiotics and steroids as well as nebulizer treatments.  Patient may require increase in her base home oxygen level from 2 L to probably 3 to 4 L.  #2 interstitial lung disease: Chronic.  Continue management as above.  #3 hypertension: Continue blood pressure control  #4 CML: In remission.   DVT prophylaxis: Lovenox Code Status: DNR Family Communication: Daughter on the phone Disposition  Plan: To be determined Consults called: None Admission status: Inpatient  Severity of Illness: The appropriate patient status for this patient is INPATIENT. Inpatient status is judged to be reasonable and necessary in order to provide the required intensity of service to ensure the patient's safety. The patient's presenting symptoms, physical exam findings, and initial radiographic and laboratory data in the context of their chronic comorbidities is felt to place them at high risk for further clinical deterioration. Furthermore, it is not anticipated that the patient will be medically stable for discharge from the hospital within 2 midnights of admission. The following factors support the patient status of inpatient.   " The patient's presenting symptoms include shortness of breath and cough. " The worrisome physical exam findings include diffuse crackles in the lungs. " The initial radiographic and laboratory data are worrisome because of possible pneumonia. " The chronic co-morbidities include history of interstitial lung disease.   * I certify that at the point of admission it is my clinical judgment that the patient will require inpatient hospital care spanning beyond 2 midnights from the point of admission due to high intensity of service, high risk for further deterioration and high frequency of surveillance required.Barbette Merino MD [facility] Pager [phone]  If 7PM-7AM, please contact night-coverage [email or web address] Password [facility]   12/07/2018, 9:41 PM

## 2018-12-07 NOTE — ED Notes (Signed)
Patient transported to CT 

## 2018-12-07 NOTE — Progress Notes (Signed)
Attempted to do nursing admission history. Pt had just gotten up to use the bathroom. Very short of breath and unable to answer questions at present time. Lucius Conn BSN, RN-BC Admissions RN 12/07/2018 8:47 PM

## 2018-12-07 NOTE — ED Provider Notes (Deleted)
Pamela House was evaluated in Emergency Department on 12/07/2018 for the symptoms described in the history of present illness. She has confirmed infection with COVID-19. Institutional protocols and algorithms that pertain to the evaluation of patients with COVID-19 are in a state of rapid change based on information released by regulatory bodies including the CDC and federal and state organizations. These policies and algorithms were followed during the patient's care in the ED.   Virgel Manifold, MD 12/07/18 [phone]

## 2018-12-07 NOTE — ED Notes (Signed)
Kim RN called for report. Patient waiting on IV placement and CT Angio.

## 2018-12-07 NOTE — ED Provider Notes (Signed)
[facility] DEPT Provider Note   CSN: [id number] Arrival date & time: 12/07/18  1509    History   Chief Complaint Chief Complaint  Patient presents with  . Shortness of Breath    HPI Pamela House is a 85 y.o. female.     HPI   85 year old female with hypoxemia.  Noted by her home health nurse today.  She has chronic respiratory failure and is normally on 2 L of oxygen.  Patient herself is not a particularly good historian.  She does state that she feels short of breath.  Also endorses some chest pressure earlier today which has since resolved.  No acute cough.  No fevers or chills.    Past Medical History:  Diagnosis Date  . Allergic rhinitis   . Anxiety   . Breast cancer ([place]) 2016   left breast  . DDD (degenerative disc disease)   . Dementia ([place])   . Dyslipidemia   . GERD (gastroesophageal reflux disease)   . Hypertension   . Leukemia ([place]) 2010  . Osteoporosis   . Personal history of chemotherapy 2016  . Personal history of radiation therapy 2016  . Radiation 04/30/15-05/1515   Left breast  . Vitamin D deficiency     Patient Active Problem List   Diagnosis Date Noted  . Cough   . Protein-calorie malnutrition, severe 11/06/2018  . IPF (idiopathic pulmonary fibrosis) ([place])   . Palliative care encounter   . DNR (do not resuscitate)   . Dyspnea 11/02/2018  . Hyponatremia 11/02/2018  . 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 Diarrhea 06/20/2015  . Malignant neoplasm of upper-outer quadrant of left breast in female, estrogen receptor negative ([place]) 02/07/2015  . Chronic cough 06/19/2013  . ILD (interstitial lung disease) ([place]) 06/19/2013  . Hypertension 02/21/2013  . CML (chronic myelocytic leukemia) ([place]) 05/23/2012    Past Surgical History:  Procedure Laterality Date  . BACK SURGERY  1984  . BREAST LUMPECTOMY Left 2016  . BREAST LUMPECTOMY WITH RADIOACTIVE SEED AND SENTINEL LYMPH NODE BIOPSY Left 02/27/2015    Procedure: LEFT BREAST LUMPECTOMY WITH RADIOACTIVE SEED AND SENTINEL LYMPH NODE BIOPSY;  Surgeon: Autumn Messing III, MD;  Location: [facility];  Service: General;  Laterality: Left;     OB History   No obstetric history on file.      Home Medications    Prior to Admission medications   Medication Sig Start Date End Date Taking? Authorizing Provider  amLODipine (NORVASC) 5 MG tablet Take 5 mg by mouth daily. 10/25/18  Yes [provider]  aspirin 81 MG tablet Take 81 mg by mouth daily.   Yes [provider]  benzonatate (TESSALON) 200 MG capsule Take 1 capsule (200 mg total) by mouth 3 (three) times daily. 11/08/18  Yes Donne Hazel, MD  carvedilol (COREG) 25 MG tablet Take 1 tablet (25 mg total) by mouth 2 (two) times daily. 10/19/17  Yes Magrinat, [name], MD  donepezil (ARICEPT) 5 MG tablet Take 5 mg by mouth at bedtime.   Yes [provider]  fluticasone (FLONASE) 50 MCG/ACT nasal spray Place 2 sprays into both nostrils daily. 09/10/18  Yes [provider]  latanoprost (XALATAN) 0.005 % ophthalmic solution Place 1 drop into both eyes at bedtime. 09/10/18  Yes [provider]  mirtazapine (REMERON) 15 MG tablet Take 15 mg by mouth at bedtime.  07/13/17  Yes [provider]  omeprazole (PRILOSEC) 20 MG capsule TAKE 1  CAPSULE BY MOUTH TWICE A DAY 10/25/18  Yes Magrinat, [name], MD  polyethylene glycol (MIRALAX / GLYCOLAX) packet Take 17 g by mouth daily.   Yes [provider]  Vitamin D, Ergocalciferol, (DRISDOL) 50000 units CAPS capsule Take 50,000 Units by mouth every 30 (thirty) days.   Yes [provider]  ALPRAZolam (XANAX) 0.5 MG tablet Take 1 tablet (0.5 mg total) by mouth 2 (two) times daily as needed for anxiety. Prescription needed for skilled nursing 11/08/18   Donne Hazel, MD  azithromycin [facility]) 250 MG tablet 1 tab po daily x 3 more days, then stop, zero refills Patient not taking:  Reported on 12/07/2018 11/08/18   Donne Hazel, MD  guaiFENesin (ROBITUSSIN) 100 MG/5ML liquid Take 200 mg by mouth every 6 (six) hours as needed for cough.    [provider]  predniSONE (DELTASONE) 10 MG tablet Take 1 tablet (10 mg total) by mouth daily. Taper dose: 45m po daily x 3 days, then 211mpo daily x 3 days, then 1027mo daily x 3 days, then 5mg72m daily x 2 days, then stop, zero refills Patient not taking: Reported on 12/07/2018 11/08/18   ChiuDonne Hazel    Family History No family history on file.  Social History Social History   Tobacco Use  . Smoking status: Never Smoker  . Smokeless tobacco: Never Used  Substance Use Topics  . Alcohol use: No    Alcohol/week: 0.0 standard drinks  . Drug use: No     Allergies   Ace inhibitors; Hyzaar [losartan potassium-hctz]; Lipitor [atorvastatin]; Losartan; and Tiazac [diltiazem hcl er beads]   Review of Systems Review of Systems  All systems reviewed and negative, other than as noted in HPI.  Physical Exam Updated Vital Signs BP 126/81 (BP Location: Right Arm)   Pulse 78   Temp 97.6 F (36.4 C) (Oral)   Resp 19   Ht _0  (1.6 m)   Wt 39 kg   SpO2 93%   BMI 15.23 kg/m   Physical Exam Vitals signs and nursing note reviewed.  Constitutional:      General: She is not in acute distress.    Appearance: She is well-developed.  HENT:     Head: Normocephalic and atraumatic.  Eyes:     General:        Right eye: No discharge.        Left eye: No discharge.     Conjunctiva/sclera: Conjunctivae normal.  Neck:     Musculoskeletal: Neck supple.  Cardiovascular:     Rate and Rhythm: Normal rate and regular rhythm.     Heart sounds: Normal heart sounds. No murmur. No friction rub. No gallop.   Pulmonary:     Comments: Crackles b/l bases Abdominal:     General: There is no distension.     Palpations: Abdomen is soft.     Tenderness: There is no abdominal tenderness.  Musculoskeletal:        General:  No tenderness.     Right lower leg: Edema present.     Left lower leg: Edema present.  Skin:    General: Skin is warm and dry.  Neurological:     Mental Status: She is alert.  Psychiatric:        Behavior: Behavior normal.        Thought Content: Thought content normal.      ED Treatments / Results  Labs (all labs ordered are listed, but only  abnormal results are displayed) Labs Reviewed  CBC WITH DIFFERENTIAL/PLATELET - Abnormal; Notable for the following components:      Result Value   Hemoglobin 11.6 (*)    All other components within normal limits  BRAIN NATRIURETIC PEPTIDE - Abnormal; Notable for the following components:   B Natriuretic Peptide 891.1 (*)    All other components within normal limits  TROPONIN I - Abnormal; Notable for the following components:   Troponin I 0.03 (*)    All other components within normal limits  BASIC METABOLIC PANEL - Abnormal; Notable for the following components:   Chloride 95 (*)    All other components within normal limits  COMPREHENSIVE METABOLIC PANEL - Abnormal; Notable for the following components:   Sodium 133 (*)    Chloride 93 (*)    Glucose, Bld 144 (*)    Albumin 3.4 (*)    All other components within normal limits  CBC - Abnormal; Notable for the following components:   Hemoglobin 11.5 (*)    All other components within normal limits  BASIC METABOLIC PANEL - Abnormal; Notable for the following components:   Chloride 93 (*)    CO2 38 (*)    BUN 25 (*)    GFR calc non Af Amer 53 (*)    All other components within normal limits  MAGNESIUM    EKG EKG Interpretation  Date/Time:  Thursday December 07 2018 19:58:51 EDT Ventricular Rate:  72 PR Interval:    QRS Duration: 86 QT Interval:  417 QTC Calculation: 457 R Axis:   125 Text Interpretation:  Sinus rhythm Ventricular premature complex Right axis deviation Low voltage, precordial leads Abnormal T, consider ischemia, diffuse leads Confirmed by Virgel Manifold  [phone]) on 12/07/2018 8:20:41 PM   Radiology Dg Chest 2 View  Result Date: 12/07/2018 CLINICAL DATA:  Dyspnea.  History of hypertension and breast cancer. EXAM: CHEST - 2 VIEW COMPARISON:  11/27/2018 and older exams. FINDINGS: Mild enlargement of the cardiac silhouette. No mediastinal or hilar masses. Lungs show heterogeneous coarse interstitial thickening with intervening hazy airspace type opacities, which predominate in the peripheral lungs and at the bases, without significant change from prior exams. No pleural effusion or pneumothorax. Skeletal structures are intact. IMPRESSION: 1. Advanced interstitial lung disease. Superimposed pneumonia is possible but not definitively seen. No evidence of pulmonary edema. 2. Mild stable cardiomegaly. Electronically Signed   By: Lajean Manes M.D.   On: 12/07/2018 18:10    Procedures Procedures (including critical care time)  Medications Ordered in ED Medications  predniSONE (DELTASONE) tablet 40 mg (40 mg Oral Given 12/07/18 1925)  albuterol (PROVENTIL HFA;VENTOLIN HFA) 108 (90 Base) MCG/ACT inhaler 6 puff (6 puffs Inhalation Given 12/07/18 1926)  furosemide (LASIX) injection 60 mg (60 mg Intravenous Given 12/07/18 1925)  ceFEPIme (MAXIPIME) 1 g in sodium chloride 0.9 % 100 mL IVPB (1 g Intravenous New Bag/Given 12/07/18 1934)     Initial Impression / Assessment and Plan / ED Course  I have reviewed the triage vital signs and the nursing notes.  Pertinent labs & imaging results that were available during my care of the patient were reviewed by me and considered in my medical decision making (see chart for details).        88yF with acute on chronic respiratory failure. Hx of pulmonary fibrosis. Suspect some element of chf is acute exacerbant though. Mild volume overload clinically. BNP elevated. Has new oxygen requirement. Admit.   Final Clinical Impressions(s) / ED Diagnoses  Final diagnoses:  Hypoxemia  Acute on chronic congestive heart  failure, unspecified heart failure type [facility])    ED Discharge Orders    None       Virgel Manifold, MD 12/11/18 [phone]

## 2018-12-07 NOTE — ED Notes (Signed)
RN at bedside. RN removed NRB and placed patient back on nasal canula at 2L. Patient maintaining at 98% on [place] with 2L O2. Patient denies difficulty breathing at this time. Pt appears to be in no active distress.

## 2018-12-07 NOTE — ED Triage Notes (Signed)
Per EMS: Pt is coming from home. Pt is normally on home oxygen at 2L and her home health nurse reported that she had decreasing sats. Patient's O2 sat came up with NRB.  Pt currently 100% on NRB.  Pt has a hx of dementia

## 2018-12-07 NOTE — ED Notes (Signed)
Attempted IV access, was not successful. Abigail Butts RN at bedside attempting IV start.

## 2018-12-08 ENCOUNTER — Other Ambulatory Visit: Payer: Self-pay

## 2018-12-08 LAB — CBC
HCT: 36.6 % (ref 36.0–46.0)
Hemoglobin: 11.5 g/dL — ABNORMAL LOW (ref 12.0–15.0)
MCH: 28.8 pg (ref 26.0–34.0)
MCHC: 31.4 g/dL (ref 30.0–36.0)
MCV: 91.7 fL (ref 80.0–100.0)
Platelets: 234 10*3/uL (ref 150–400)
RBC: 3.99 MIL/uL (ref 3.87–5.11)
RDW: 13.8 % (ref 11.5–15.5)
WBC: 4.4 10*3/uL (ref 4.0–10.5)
nRBC: 0 % (ref 0.0–0.2)

## 2018-12-08 LAB — COMPREHENSIVE METABOLIC PANEL
ALT: 18 U/L (ref 0–44)
ANION GAP: 10 (ref 5–15)
AST: 22 U/L (ref 15–41)
Albumin: 3.4 g/dL — ABNORMAL LOW (ref 3.5–5.0)
Alkaline Phosphatase: 65 U/L (ref 38–126)
BILIRUBIN TOTAL: 0.8 mg/dL (ref 0.3–1.2)
BUN: 17 mg/dL (ref 8–23)
CO2: 30 mmol/L (ref 22–32)
Calcium: 9.6 mg/dL (ref 8.9–10.3)
Chloride: 93 mmol/L — ABNORMAL LOW (ref 98–111)
Creatinine, Ser: 0.71 mg/dL (ref 0.44–1.00)
GFR calc Af Amer: >60 mL/min (ref >60)
GFR calc non Af Amer: >60 mL/min (ref >60)
Glucose, Bld: 144 mg/dL — ABNORMAL HIGH (ref 70–99)
Potassium: 3.5 mmol/L (ref 3.5–5.1)
Sodium: 133 mmol/L — ABNORMAL LOW (ref 135–145)
TOTAL PROTEIN: 7.2 g/dL (ref 6.5–8.1)

## 2018-12-08 MED ORDER — MIRTAZAPINE 15 MG PO TABS
15.0000 mg | ORAL_TABLET | Freq: Every day | ORAL | Status: DC
  Administered 2018-12-08 (×2): 15 mg via ORAL
  Filled 2018-12-08 (×2): qty 1

## 2018-12-08 MED ORDER — ADULT MULTIVITAMIN W/MINERALS CH
1.0000 | ORAL_TABLET | Freq: Every day | ORAL | Status: DC
  Administered 2018-12-08 – 2018-12-09 (×2): 1 via ORAL
  Filled 2018-12-08 (×2): qty 1

## 2018-12-08 MED ORDER — CARVEDILOL 25 MG PO TABS
25.0000 mg | ORAL_TABLET | Freq: Two times a day (BID) | ORAL | Status: DC
  Administered 2018-12-08 – 2018-12-09 (×4): 25 mg via ORAL
  Filled 2018-12-08 (×4): qty 1

## 2018-12-08 MED ORDER — IBUPROFEN 200 MG PO TABS: 600.0000 mg | ORAL_TABLET | Freq: Four times a day (QID) | ORAL | Status: DC | PRN

## 2018-12-08 MED ORDER — PANTOPRAZOLE SODIUM 40 MG PO TBEC
40.0000 mg | DELAYED_RELEASE_TABLET | Freq: Every day | ORAL | Status: DC
  Administered 2018-12-08 – 2018-12-09 (×2): 40 mg via ORAL
  Filled 2018-12-08 (×2): qty 1

## 2018-12-08 MED ORDER — LATANOPROST 0.005 % OP SOLN
1.0000 [drp] | Freq: Every day | OPHTHALMIC | Status: DC
  Administered 2018-12-08 (×2): 1 [drp] via OPHTHALMIC
  Filled 2018-12-08: qty 2.5

## 2018-12-08 MED ORDER — DONEPEZIL HCL 5 MG PO TABS
5.0000 mg | ORAL_TABLET | Freq: Every day | ORAL | Status: DC
  Administered 2018-12-08 (×2): 5 mg via ORAL
  Filled 2018-12-08 (×2): qty 1

## 2018-12-08 MED ORDER — VITAMIN D (ERGOCALCIFEROL) 1.25 MG (50000 UNIT) PO CAPS
50000.0000 [IU] | ORAL_CAPSULE | ORAL | Status: DC
  Administered 2018-12-08: 50000 [IU] via ORAL
  Filled 2018-12-08: qty 1

## 2018-12-08 MED ORDER — ENSURE ENLIVE PO LIQD
237.0000 mL | Freq: Three times a day (TID) | ORAL | Status: DC
  Administered 2018-12-08 – 2018-12-09 (×3): 237 mL via ORAL

## 2018-12-08 MED ORDER — SODIUM CHLORIDE 0.45 % IV SOLN
INTRAVENOUS | Status: DC
  Administered 2018-12-08: 75 mL/h via INTRAVENOUS

## 2018-12-08 MED ORDER — ACETAMINOPHEN 325 MG PO TABS
650.0000 mg | ORAL_TABLET | Freq: Four times a day (QID) | ORAL | Status: DC | PRN
  Administered 2018-12-08: 650 mg via ORAL
  Filled 2018-12-08: qty 2

## 2018-12-08 MED ORDER — BENZONATATE 100 MG PO CAPS
200.0000 mg | ORAL_CAPSULE | Freq: Three times a day (TID) | ORAL | Status: DC
  Administered 2018-12-08 – 2018-12-09 (×4): 200 mg via ORAL
  Filled 2018-12-08 (×4): qty 2

## 2018-12-08 MED ORDER — ALPRAZOLAM 0.5 MG PO TABS
0.5000 mg | ORAL_TABLET | Freq: Two times a day (BID) | ORAL | Status: DC | PRN
  Administered 2018-12-08: 0.5 mg via ORAL
  Filled 2018-12-08: qty 1

## 2018-12-08 MED ORDER — ONDANSETRON HCL 4 MG PO TABS: 4.0000 mg | ORAL_TABLET | Freq: Four times a day (QID) | ORAL | Status: DC | PRN

## 2018-12-08 MED ORDER — FUROSEMIDE 10 MG/ML IJ SOLN
20.0000 mg | Freq: Two times a day (BID) | INTRAMUSCULAR | Status: DC
  Administered 2018-12-08 – 2018-12-09 (×3): 20 mg via INTRAVENOUS
  Filled 2018-12-08 (×3): qty 2

## 2018-12-08 MED ORDER — GUAIFENESIN 100 MG/5ML PO SOLN
200.0000 mg | Freq: Four times a day (QID) | ORAL | Status: DC | PRN
  Administered 2018-12-08: 200 mg via ORAL
  Filled 2018-12-08: qty 10

## 2018-12-08 MED ORDER — ASPIRIN EC 81 MG PO TBEC
81.0000 mg | DELAYED_RELEASE_TABLET | Freq: Every day | ORAL | Status: DC
  Administered 2018-12-08 – 2018-12-09 (×2): 81 mg via ORAL
  Filled 2018-12-08 (×2): qty 1

## 2018-12-08 MED ORDER — AMLODIPINE BESYLATE 5 MG PO TABS
5.0000 mg | ORAL_TABLET | Freq: Every day | ORAL | Status: DC
  Administered 2018-12-08 – 2018-12-09 (×2): 5 mg via ORAL
  Filled 2018-12-08 (×2): qty 1

## 2018-12-08 MED ORDER — ONDANSETRON HCL 4 MG/2ML IJ SOLN: 4.0000 mg | Freq: Four times a day (QID) | INTRAMUSCULAR | Status: DC | PRN

## 2018-12-08 MED ORDER — ENOXAPARIN SODIUM 30 MG/0.3ML ~~LOC~~ SOLN
30.0000 mg | SUBCUTANEOUS | Status: DC
  Administered 2018-12-08 – 2018-12-09 (×2): 30 mg via SUBCUTANEOUS
  Filled 2018-12-08 (×2): qty 0.3

## 2018-12-08 MED ORDER — FLUTICASONE PROPIONATE 50 MCG/ACT NA SUSP
2.0000 | Freq: Every day | NASAL | Status: DC
  Administered 2018-12-08 – 2018-12-09 (×2): 2 via NASAL
  Filled 2018-12-08: qty 16

## 2018-12-08 MED ORDER — POLYETHYLENE GLYCOL 3350 17 G PO PACK
17.0000 g | PACK | Freq: Every day | ORAL | Status: DC
  Administered 2018-12-08 – 2018-12-09 (×2): 17 g via ORAL
  Filled 2018-12-08 (×2): qty 1

## 2018-12-08 NOTE — Progress Notes (Signed)
Lovenox per Pharmacy for DVT Prophylaxis    Pharmacy has been consulted from dosing enoxaparin (lovenox) in this patient for DVT prophylaxis.  The pharmacist has reviewed pertinent labs (Hgb _11.6__; PLT_238__), patient weight (_36__kg) and renal function (CrCl_28.7__mL/min) and decided that enoxaparin _30_mg SQ Q24Hrs is appropriate for this patient.  The pharmacy department will sign off at this time.  Please reconsult pharmacy if status changes or for further issues.  Thank you  Cyndia Diver PharmD, BCPS  12/08/2018, 12:14 AM

## 2018-12-08 NOTE — Progress Notes (Signed)
Initial Nutrition Assessment  DOCUMENTATION CODES:   Severe malnutrition in context of chronic illness, Underweight  INTERVENTION:   -Ensure Enlive po TID, each supplement provides 350 kcal and 20 grams of protein -MVI with minerals daily  NUTRITION DIAGNOSIS:   Severe Malnutrition related to chronic illness(ILD, dementia) as evidenced by energy intake < or equal to 75% for > or equal to 1 month, percent weight loss.  GOAL:   Patient will meet greater than or equal to 90% of their needs  MONITOR:   PO intake, Supplement acceptance, Labs, Weight trends, Skin, I & O's  REASON FOR ASSESSMENT:   Malnutrition Screening Tool, Other (Comment)(low BMI)    ASSESSMENT:   Pamela House is a 85 y.o. female with medical history significant of interstitial fibrosis of the lung, chronic respiratory failure, dementia, breast cancer, GERD, hypertension, CML, who was in the hospital February 28 with acute on chronic respiratory failure.  She was discharged home on prednisone Dosepak.  Patient has completed treatment.  She came to the ER today due to significant worsening of shortness of breath.  Patient is on 2 L of oxygen at home but oxygen sats at rest was 89% on the 2 L and 76% with activity.  Patient denied any fever or chills.  Denied any sick contacts.  Denied any traveling.  She appears to have increased oxygen demand and is being admitted with acute on chronic respiratory failure..  Pt admitted with acute on chronic respiratory failure with hypoxia.   Reviewed I/O's: +625 ml x 24 hours  Spoke with pt on phone, who was pleasant, but unable to provide much history. She reports that she "feels terrible" today. She endorses a poor appetite "for a long time". Attempted to obtain a detailed diet history, however, pt did not provide specifics into what she eats or how many times during the day. From phone conversation, pt confirms that she she typically grazes throughout the day and "eats a  little bit of this and a little bit of that, but it's never anything much". Pt also reports eating very poorly over the past several month due to being in and out of the hospital (noted 2 ED visit, including this admission, since last discharge in late February 2020).   Pt reports that she has not eaten anything at all today, but reports she will try to eat lunch "to keep my strength up". Pt reports she does not drink Ensure at home, "but I love them".   Reviewed wt hx; noted pt has experienced a 18.1% wt loss over the past 6 months, which is significant for time frame. Significant weight loss, coupled with prior malnutrition diagnosis and history, likely confirms that pt is consistently meeting less than 75% of estimated nutritional needs at home for at least the past month consistently.   Discussed with pt importance of good meal and supplement intake to promote healing. She is amenable to consume Ensure supplements. Encouraged pt to consume Ensure between meals, but also consume as much food as she can off meal trays.   Per MD notes, possible SNF placement at discharge.   Medications reviewed and include remeron, furosemide, and polyethylene glycol.   Labs reviewed: Na: 133.   Diet Order:   Diet Order            Diet Heart Room service appropriate? Yes; Fluid consistency: Thin  Diet effective now              EDUCATION NEEDS:  Education needs have been addressed  Skin:  Skin Assessment: Reviewed RN Assessment  Last BM:  Unknown  Height:   Ht Readings from Last 1 Encounters:  12/07/18 _0  (1.6 m)    Weight:   Wt Readings from Last 1 Encounters:  12/07/18 36 kg    Ideal Body Weight:  52.3 kg  BMI:  Body mass index is 14.06 kg/m.  Estimated Nutritional Needs:   Kcal:  1100-1300  Protein:  40-55 grams  Fluid:  > 1.1 L    Drevin Ortner A. Jimmye Norman, RD, LDN, [place] Registered Dietitian II Certified Diabetes Care and Education Specialist Pager: [phone] After  hours Pager: [phone]

## 2018-12-08 NOTE — Progress Notes (Signed)
PROGRESS NOTE    Pamela House  EGB:[id number] DOB: [birth year] DOA: 12/07/2018 PCP: Wenda Low, MD   Brief Narrative:  85 year old with past medical history relevant for interstitial lung disease/idiopathic pulmonary fibrosis on 2 L nasal cannula, pulmonary hypertension noted by echo on 11/03/2018, hypertension, CML currently thought to be in remission, dementia who presents with shortness of breath.   Assessment & Plan:   Principal Problem:   Hypoxia Active Problems:   CML (chronic myelocytic leukemia) ([facility])   Hypertension   ILD (interstitial lung disease) ([place])   IPF (idiopathic pulmonary fibrosis) ([facility])   DNR (do not resuscitate)   #) Shortness of breath/interstitial lung disease/idiopathic pulmonary fibrosis: Patient is now required 4 L nasal cannula she has had significant desaturations on 2 noted.  CT scan shows no acute pulmonary process but does show underlying lung disease as well as evidence of pulmonary arterial hypertension.  Strongly suspect that she will have increased oxygen requirements due to her progression of her lung disease.  There does not appear to be any underlying fluid overload or pneumonia that is treatable. -Increase oxygen -PT consult -We will discuss with patient and family about possible skilled nursing facility placement  #) Pulmonary hypertension: Noted on echo.  She does not appear to be particularly fluid overloaded though she might need some gentle diuresis to help with her oxygenation.  This is likely secondary to her underlying interstitial lung disease. -Start IV furosemide 20 mg twice daily  #) Hypertension: -Continue aspirin 81 mg daily -Continue amlodipine 5 mg daily -Continue carvedilol 25 mg twice daily  #) Dementia/pain/psych: -Continue mirtazapine 15 mg nightly -Continue donepezil 5 mg nightly -Continue PRN alprazolam  #) CML: This is currently not an active issue and appears to be in remission.  Fluids: Tolerating  p.o. Electrolytes: Monitor and supplement Nutrition: Regular diet   Prophylaxis: Enoxaparin  Disposition: Pending PT evaluation  To not resuscitate  Consultants:   None  Procedures:   None  Antimicrobials:   None   Subjective: This morning the patient reports she is feeling well.  She reports her shortness of breath waxes and wanes.  She denies any nausea, vomiting, diarrhea, cough, congestion, rhinorrhea.  She reports that she lives alone at home is on 2 L normally.  Objective: Vitals:   12/07/18 1746 12/07/18 1934 12/07/18 2347 12/08/18 0603  BP: (!) 148/79 126/81 (!) 116/91 (!) 108/59  Pulse: 65 78 82 71  Resp: 13 19 (!) 32 20  Temp:  97.6 F (36.4 C) 97.7 F (36.5 C) 98.6 F (37 C)  TempSrc:  Oral Oral Oral  SpO2: 93% 93% 97% 99%  Weight:   36 kg   Height:   _0  (1.6 m)     Intake/Output Summary (Last 24 hours) at 12/08/2018 0926 Last data filed at 12/08/2018 0500 Gross per 24 hour  Intake 625 ml  Output --  Net 625 ml   Filed Weights   12/07/18 1529 12/07/18 2347  Weight: 39 kg 36 kg    Examination:  General exam: Appears calm and comfortable  Respiratory system: Clear to auscultation. Respiratory effort normal. Cardiovascular system: Distant heart sounds, regular rate and rhythm, no murmurs Gastrointestinal system: Soft, nondistended, no rebound or guarding, plus bowel sounds Central nervous system: Alert and oriented.  Grossly intact, moving all extremities Extremities: No lower extremity edema Skin: No rashes over visible skin Psychiatry: Unable to assess due to underlying dementia    Data Reviewed: I have personally reviewed following labs and  imaging studies  CBC: Recent Labs  Lab 12/07/18 1559 12/08/18 0116  WBC 6.5 4.4  NEUTROABS 5.1  --   HGB 11.6* 11.5*  HCT 37.2 36.6  MCV 93.7 91.7  PLT 238 412   Basic Metabolic Panel: Recent Labs  Lab 12/07/18 1638 12/08/18 0116  NA 135 133*  K 4.1 3.5  CL 95* 93*  CO2 30 30   GLUCOSE 94 144*  BUN 16 17  CREATININE 0.59 0.71  CALCIUM 9.9 9.6   GFR: Estimated Creatinine Clearance: 28.7 mL/min (by C-G formula based on SCr of 0.71 mg/dL). Liver Function Tests: Recent Labs  Lab 12/08/18 0116  AST 22  ALT 18  ALKPHOS 65  BILITOT 0.8  PROT 7.2  ALBUMIN 3.4*   No results for input(s): LIPASE, AMYLASE in the last 168 hours. No results for input(s): AMMONIA in the last 168 hours. Coagulation Profile: No results for input(s): INR, PROTIME in the last 168 hours. Cardiac Enzymes: Recent Labs  Lab 12/07/18 1559  TROPONINI 0.03*   BNP (last 3 results) No results for input(s): PROBNP in the last 8760 hours. HbA1C: No results for input(s): HGBA1C in the last 72 hours. CBG: No results for input(s): GLUCAP in the last 168 hours. Lipid Profile: No results for input(s): CHOL, HDL, LDLCALC, TRIG, CHOLHDL, LDLDIRECT in the last 72 hours. Thyroid Function Tests: No results for input(s): TSH, T4TOTAL, FREET4, T3FREE, THYROIDAB in the last 72 hours. Anemia Panel: No results for input(s): VITAMINB12, FOLATE, FERRITIN, TIBC, IRON, RETICCTPCT in the last 72 hours. Sepsis Labs: No results for input(s): PROCALCITON, LATICACIDVEN in the last 168 hours.  No results found for this or any previous visit (from the past 240 hour(s)).       Radiology Studies: Dg Chest 2 View  Result Date: 12/07/2018 CLINICAL DATA:  Dyspnea.  History of hypertension and breast cancer. EXAM: CHEST - 2 VIEW COMPARISON:  11/27/2018 and older exams. FINDINGS: Mild enlargement of the cardiac silhouette. No mediastinal or hilar masses. Lungs show heterogeneous coarse interstitial thickening with intervening hazy airspace type opacities, which predominate in the peripheral lungs and at the bases, without significant change from prior exams. No pleural effusion or pneumothorax. Skeletal structures are intact. IMPRESSION: 1. Advanced interstitial lung disease. Superimposed pneumonia is possible but  not definitively seen. No evidence of pulmonary edema. 2. Mild stable cardiomegaly. Electronically Signed   By: Lajean Manes M.D.   On: 12/07/2018 18:10   Ct Angio Chest Pe W And/or Wo Contrast  Result Date: 12/07/2018 CLINICAL DATA:  85 year old female on home oxygen with chest pain and shortness of breath. EXAM: CT ANGIOGRAPHY CHEST WITH CONTRAST TECHNIQUE: Multidetector CT imaging of the chest was performed using the standard protocol during bolus administration of intravenous contrast. Multiplanar CT image reconstructions and MIPs were obtained to evaluate the vascular anatomy. CONTRAST:  22m OMNIPAQUE IOHEXOL 350 MG/ML SOLN COMPARISON:  Chest radiographs earlier today. Chest CT 11/06/2018. FINDINGS: Cardiovascular: Excellent contrast bolus timing in the pulmonary arterial tree. Mild respiratory motion. No focal filling defect identified in the pulmonary arteries to suggest acute pulmonary embolism. Chronic pulmonary artery enlargement. Chronic Calcified aortic atherosclerosis. Calcified coronary artery atherosclerosis. Stable cardiomegaly. No pericardial effusion. Mediastinum/Nodes: Stable, negative. Lungs/Pleura: Extensive chronic interstitial and fibrotic appearing lung disease with peripheral and right basilar areas of honeycombing and bronchiectasis. Stable low lung volumes. Stable major airway patency. No acute pulmonary opacity identified. No pleural effusion. Upper Abdomen: Negative visible liver, spleen, pancreas, kidneys, and stomach. Musculoskeletal: Stable visualized osseous structures. Mild  chronic T11 superior endplate compression. Review of the MIP images confirms the above findings. IMPRESSION: 1. No evidence of acute pulmonary embolus. Chronic pulmonary artery enlargement compatible with a degree of pulmonary artery hypertension. 2. Chronic pulmonary fibrosis. No acute pulmonary abnormality. 3. Calcified aortic and coronary artery atherosclerosis. Stable cardiomegaly. Electronically Signed    By: Genevie Ann M.D.   On: 12/07/2018 22:31        Scheduled Meds:  amLODipine  5 mg Oral Daily   aspirin EC  81 mg Oral Daily   benzonatate  200 mg Oral TID   carvedilol  25 mg Oral BID   donepezil  5 mg Oral QHS   enoxaparin (LOVENOX) injection  30 mg Subcutaneous Q24H   fluticasone  2 spray Each Nare Daily   latanoprost  1 drop Both Eyes QHS   mirtazapine  15 mg Oral QHS   pantoprazole  40 mg Oral Daily   polyethylene glycol  17 g Oral Daily   Vitamin D (Ergocalciferol)  50,000 Units Oral Q30 days   Continuous Infusions:  levofloxacin (LEVAQUIN) IV 500 mg (12/08/18 0103)     LOS: 1 day    Time spent: 55    Cristy Folks, MD [facility]  If 7PM-7AM, please contact night-coverage [email or web address] Password [facility] 12/08/2018, 9:26 AM

## 2018-12-08 NOTE — Evaluation (Addendum)
Physical Therapy Evaluation Patient Details Name: Pamela House MRN: [id number] DOB: 1929-10-23 Today's Date: 12/08/2018   History of Present Illness  85 year old with past medical history relevant for interstitial lung disease/idiopathic pulmonary fibrosis on 2 L nasal cannula, pulmonary hypertension noted by echo on 11/03/2018, hypertension, CML currently thought to be in remission, dementia who presents with shortness of breath.  Clinical Impression  Pt admitted with above diagnosis. Pt currently with functional limitations due to the deficits listed below (see PT Problem List).  Pt with limited mobility secondary severe DOE; pt is only able to tolerate sitting EOB, SpO2=85-92% on 3L. Discussed with RN. Will continue to follow in acute setting; pt states her dtr can stay with her and assist as needed, recommend  HHPT at this time;  If pt does not have 24hr assist she may need SNF  Pt will benefit from skilled PT to increase their independence and safety with mobility to allow discharge to the venue listed below.       Follow Up Recommendations Home health PT;Supervision/Assistance - 24 hour    Equipment Recommendations  None recommended by PT    Recommendations for Other Services       Precautions / Restrictions Precautions Precautions: Fall      Mobility  Bed Mobility Overal bed mobility: Needs Assistance Bed Mobility: Sit to Supine     Supine to sit: Min guard Sit to supine: Min guard   General bed mobility comments: incr time, cues to self assist, min/guard for safety  Transfers                 General transfer comment: too dyspneic to transfer or stand  Ambulation/Gait             General Gait Details: unable  Stairs            Wheelchair Mobility    Modified Rankin (Stroke Patients Only)       Balance Overall balance assessment: Needs assistance Sitting-balance support: No upper extremity supported;Feet supported Sitting  balance-Leahy Scale: Fair Sitting balance - Comments: dificulty wt shifting d/t dyspnea                                     Pertinent Vitals/Pain Pain Assessment: No/denies pain    Home Living Family/patient expects to be discharged to:: Private residence Living Arrangements: Alone Available Help at Discharge: Family;Available PRN/intermittently(pt states her dtr is not working currently) Type of Home: House       Home Layout: One level Home Equipment: [place] - single point;Bedside commode;Wheelchair - manual      Prior Function Level of Independence: Needs assistance   Gait / Transfers Assistance Needed: household ambulator, uses cane or cane   ADL's / Homemaking Assistance Needed: mostly sponge bathes. no physical assist for bathing/dressing.  family assist prn with IADLs.        Hand Dominance        Extremity/Trunk Assessment   Upper Extremity Assessment Upper Extremity Assessment: Generalized weakness    Lower Extremity Assessment Lower Extremity Assessment: Generalized weakness       Communication   Communication: No difficulties  Cognition Arousal/Alertness: Awake/alert Behavior During Therapy: WFL for tasks assessed/performed Overall Cognitive Status: History of cognitive impairments - at baseline  General Comments: dementia per chart review      General Comments      Exercises     Assessment/Plan    PT Assessment Patient needs continued PT services  PT Problem List Decreased strength;Decreased range of motion;Decreased activity tolerance;Decreased mobility;Decreased knowledge of use of DME       PT Treatment Interventions DME instruction;Gait training;Functional mobility training;Therapeutic activities;Therapeutic exercise;Patient/family education    PT Goals (Current goals can be found in the Care Plan section)  Acute Rehab PT Goals Patient Stated Goal: home  PT Goal  Formulation: With patient Time For Goal Achievement: 12/22/18 Potential to Achieve Goals: Good    Frequency Min 3X/week   Barriers to discharge        Co-evaluation               AM-PAC PT "6 Clicks" Mobility  Outcome Measure Help needed turning from your back to your side while in a flat bed without using bedrails?: A Little Help needed moving from lying on your back to sitting on the side of a flat bed without using bedrails?: A Little Help needed moving to and from a bed to a chair (including a wheelchair)?: A Lot Help needed standing up from a chair using your arms (e.g., wheelchair or bedside chair)?: A Lot Help needed to walk in hospital room?: A Lot Help needed climbing 3-5 steps with a railing? : Total 6 Click Score: 13    End of Session   Activity Tolerance: Patient limited by fatigue Patient left: in bed;with bed alarm set;with call bell/phone within reach Nurse Communication: Mobility status PT Visit Diagnosis: Muscle weakness (generalized) (M62.81)    Time: 3578-9784 PT Time Calculation (min) (ACUTE ONLY): 17 min   Charges:   PT Evaluation $PT Eval Low Complexity: 1 Low          Kenyon Ana, PT  Pager: [phone] Acute Rehab Dept [facility]): [phone]   12/08/2018   [facility] 12/08/2018, 3:53 PM

## 2018-12-08 NOTE — Progress Notes (Signed)
Dr Herbert Moors paged due to patient having a "terrible" headache. Need PRN medication. Donne Hazel, RN

## 2018-12-09 LAB — MAGNESIUM: Magnesium: 2 mg/dL (ref 1.7–2.4)

## 2018-12-09 LAB — BASIC METABOLIC PANEL
Anion gap: 7 (ref 5–15)
BUN: 25 mg/dL — ABNORMAL HIGH (ref 8–23)
CO2: 38 mmol/L — ABNORMAL HIGH (ref 22–32)
Calcium: 9.3 mg/dL (ref 8.9–10.3)
Chloride: 93 mmol/L — ABNORMAL LOW (ref 98–111)
Creatinine, Ser: 0.97 mg/dL (ref 0.44–1.00)
GFR calc Af Amer: >60 mL/min (ref >60)
GFR calc non Af Amer: 53 mL/min — ABNORMAL LOW (ref >60)
Glucose, Bld: 95 mg/dL (ref 70–99)
Sodium: 138 mmol/L (ref 135–145)

## 2018-12-09 LAB — BASIC METABOLIC PANEL WITH GFR: Potassium: 4.1 mmol/L (ref 3.5–5.1)

## 2018-12-09 NOTE — Discharge Instructions (Signed)
Home Oxygen Use, Adult When a medical condition keeps you from getting enough oxygen, your health care provider may instruct you to take extra oxygen at home. Your health care provider will let you know:  When to take oxygen.  For how long to take oxygen.  How quickly oxygen should be delivered (flow rate), in liters per minute (LPM or L/M). Home oxygen can be given through:  A mask.  A nasal cannula. This is a device or tube that goes in the nostrils.  A transtracheal catheter. This is a small, flexible tube placed in the trachea.  A tracheostomy. This is a surgically made opening in the trachea. These devices are connected with tubing to an oxygen source, such as:  A tank. Tanks hold oxygen in gas form. They must be replaced when the oxygen is used up.  A liquid oxygen device. This holds oxygen in liquid form. It must be replaced when the oxygen is used up.  An oxygen concentrator machine. This filters oxygen in the room. It uses electricity, so you must have a backup cylinder of oxygen in case the power goes out. Supplies needed: To use oxygen, you will need:  A mask, nasal cannula, transtracheal catheter, or tracheostomy.  An oxygen tank, a liquid oxygen device, or an oxygen concentrator.  The tape that your health care provider recommends (optional). If you use a transtracheal catheter and your prescribed flow rate is 1 LPM or greater, you will also need a humidifier. Risks and complications  Fire. This can happen if the oxygen is exposed to a heat source, flame, or spark.  Injury to skin. This can happen if liquid oxygen touches your skin.  Organ damage. This can happen if you get too little oxygen. How to use oxygen Your health care provider or a representative from your [place] will show you how to use your oxygen device. Follow her or his instructions. The instructions may look something like this: 1. Wash your hands. 2. If you use an oxygen  concentrator, make sure it is plugged in. 3. Place one end of the tube into the port on the tank, device, or machine. 4. Place the mask over your nose and mouth. Or, place the nasal cannula and secure it with tape if instructed. If you use a tracheostomy or transtracheal catheter, connect it to the oxygen source as directed. 5. Make sure the liter-flow setting on the machine is at the level prescribed by your health care provider. 6. Turn on the machine or adjust the knob on the tank or device to the correct liter-flow setting. 7. When you are done, turn off and unplug the machine, or turn the knob to OFF. How to clean and care for the oxygen supplies Nasal cannula  Clean it with a warm, wet cloth daily or as needed.  Wash it with a liquid soap once a week.  Rinse it thoroughly once or twice a week.  Replace it every 2-4 weeks.  If you have an infection, such as a cold or pneumonia, change the cannula when you get better. Mask  Replace it every 2-4 weeks.  If you have an infection, such as a cold or pneumonia, change the mask when you get better. Humidifier bottle  Wash the bottle between each refill: ? Wash it with soap and warm water. ? Rinse it thoroughly. ? Disinfect it and its top. ? Air-dry it.  Make sure it is dry before you refill it. Oxygen concentrator  Clean  the air filter at least twice a week according to directions from your home medical equipment and service company.  Wipe down the cabinet every day. To do this: ? Unplug the unit. ? Wipe down the cabinet with a damp cloth. ? Dry the cabinet. Other equipment  Change any extra tubing every 1-3 months.  Follow instructions from your health care provider about taking care of any other equipment. Safety tips Fire safety tips   Keep your oxygen and oxygen supplies at least 5 ft away from sources of heat, flames, and sparks at all times.  Do not allow smoking near your oxygen. Put up "no smoking" signs in  your home. Avoid smoking areas when in public.  Do not use materials that can burn (are flammable) while you use oxygen.  When you go to a restaurant with portable oxygen, ask to be seated in the nonsmoking section.  Keep a Data processing manager close by. Let your fire department know that you have oxygen in your home.  Test your home smoke detectors regularly. Traveling  Secure your oxygen tank in the vehicle so that it does not move around. Follow instructions from your medical device company about how to safely secure your tank.  Make sure you have enough oxygen for the amount of time you will be away from home.  If you are planning air travel, contact the airline to find out if they allow the use of an approved portable oxygen concentrator. You may also need documents from your health care provider and medical device company before you travel. General safety tips  If you use an oxygen cylinder, make sure it is in a stand or secured to an object that will not move (fixed object).  If you use liquid oxygen, make sure its container is kept upright.  If you use an oxygen concentrator: ? Dance movement psychotherapist company. Make sure you are given priority service in the event that your power goes out. ? Avoid using extension cords, if possible. Follow these instructions at home:  Use oxygen only as told by your health care provider.  Do not use alcohol or other drugs that make you relax (sedating drugs) unless instructed. They can slow down your breathing rate and make it hard to get in enough oxygen.  Know how and when to order a refill of oxygen.  Always keep a spare tank of oxygen. Plan ahead for holidays when you may not be able to get a prescription filled.  Use water-based lubricants on your lips or nostrils. Do not use oil-based products like petroleum jelly.  To prevent skin irritation on your cheeks or behind your ears, tuck some gauze under the tubing. Contact a health care  provider if:  You get headaches often.  You have shortness of breath.  You have a lasting cough.  You have anxiety.  You are sleepy all the time.  You develop an illness that affects your breathing.  You cannot exercise at your regular level.  You are restless.  You have difficult or irregular breathing, and it is getting worse.  You have a fever.  You have persistent redness under your nose. Get help right away if:  You are confused.  You have blue lips or fingernails.  You are struggling to breathe. Summary  Your health care provider or a representative from your [place] will show you how to use your oxygen device. Follow her or his instructions.  If you use an oxygen concentrator, make  sure it is plugged in.  Make sure the liter-flow setting on the machine is at the level prescribed by your health care provider.  Keep your oxygen and oxygen supplies at least 5 ft away from sources of heat, flames, and sparks at all times. This information is not intended to replace advice given to you by your health care provider. Make sure you discuss any questions you have with your health care provider. Document Released: 11/20/2003 Document Revised: 02/16/2018 Document Reviewed: 03/23/2016 Elsevier Interactive Patient Education  2019 [employer].

## 2018-12-09 NOTE — TOC Initial Note (Addendum)
Transition of Care [facility]) - Initial/Assessment Note    Patient Details  Name: Pamela House MRN: [id number] Date of Birth: 04/15/1930  Transition [facility]) CM/SW Contact:    Erenest Rasher, RN Phone Number: 12/09/2018, 10:48 AM  Clinical Narrative:                  Spoke to pt's daughter, Vernie Ammons. She is active with [facility] for [facility]. Contacted [facility] rep to make aware of dc home today with resumption of care. She has oxygen at home. Dtr will contact her PCP to arrange follow up appt in 1 week.    Expected Discharge Plan: [place] Barriers to Discharge: No Barriers Identified   Patient Goals and CMS Choice Patient states their goals for this hospitalization and ongoing recovery are:: stay well CMS Medicare.gov Compare Post Acute Care list provided to:: Patient Represenative (must comment)([facility]) Choice offered to / list presented to : Patient, Adult Children  Expected Discharge Plan and Services Expected Discharge Plan: [place] In-house Referral: NA Discharge Planning Services: CM Consult Post Acute Care Choice: [place] arrangements for the past 2 months: Single Family Home Expected Discharge Date: 12/09/18               DME Arranged: N/A DME Agency: NA HH Arranged: RN, PT, Nurse's Aide, Social Work [employer] Agency: [place]  Prior Living Arrangements/Services Living arrangements for the past 2 months: [place] with:: Adult Children(daughter [place] )   Do you feel safe going back to the place where you live?: Yes      Need for Family Participation in Patient Care: Yes (Comment) Care giver support system in place?: Yes (comment) Current home services: DME(transport chair, RW, cane, oxygen ([employer])) Criminal Activity/Legal Involvement Pertinent to Current Situation/Hospitalization: No - Comment as needed  Activities of Daily Living Home Assistive  Devices/Equipment: Cane (specify quad or straight), Eyeglasses, Dentures (specify type)(upper and lower dentures) ADL Screening (condition at time of admission) Patient's cognitive ability adequate to safely complete daily activities?: No Is the patient deaf or have difficulty hearing?: Yes Does the patient have difficulty seeing, even when wearing glasses/contacts?: No Does the patient have difficulty concentrating, remembering, or making decisions?: Yes Patient able to express need for assistance with ADLs?: Yes Does the patient have difficulty dressing or bathing?: Yes Independently performs ADLs?: No Communication: Independent Dressing (OT): Needs assistance Is this a change from baseline?: Pre-admission baseline Grooming: Needs assistance Is this a change from baseline?: Pre-admission baseline Feeding: Independent Bathing: Needs assistance Is this a change from baseline?: Pre-admission baseline Toileting: Needs assistance Is this a change from baseline?: Pre-admission baseline In/Out Bed: Needs assistance Is this a change from baseline?: Pre-admission baseline Walks in Home: Needs assistance Is this a change from baseline?: Pre-admission baseline Does the patient have difficulty walking or climbing stairs?: Yes Weakness of Legs: Both Weakness of Arms/Hands: Both  Permission Sought/Granted Permission sought to share information with : Case Manager, PCP, Family Supports Permission granted to share information with : Yes, Verbal Permission Granted  Share Information with NAME: (daughter, Vernie Ammons)  Permission granted to share info w AGENCY: all  Permission granted to share info w Relationship: daughter     Emotional Assessment Appearance:: Appears stated age Attitude/Demeanor/Rapport: Engaged Affect (typically observed): Appropriate Orientation: : Oriented to Self, Oriented to Place, Oriented to  Time, Oriented to Situation   Psych Involvement: No (comment)  Admission  diagnosis:  Respiratory Distress Patient Active Problem List   Diagnosis Date Noted  . Hypoxia 12/07/2018  . Cough   . Protein-calorie malnutrition, severe 11/06/2018  . IPF (idiopathic pulmonary fibrosis) ([place])   . Palliative care encounter   . DNR (do not resuscitate)   . Dyspnea 11/02/2018  . Hyponatremia 11/02/2018  . Cardiomyopathy due to chemotherapy ([place]) 10/02/2015  . Diarrhea 06/20/2015  . Malignant neoplasm of upper-outer quadrant of left breast in female, estrogen receptor negative ([place]) 02/07/2015  . Chronic cough 06/19/2013  . ILD (interstitial lung disease) ([place]) 06/19/2013  . Hypertension 02/21/2013  . CML (chronic myelocytic leukemia) ([place]) 05/23/2012   PCP:  Wenda Low, MD Pharmacy:   [facility] [place], [place] [address] [place] [address] Phone: [phone] Fax: [phone]     Social Determinants of Health (SDOH) Interventions    Readmission Risk Interventions No flowsheet data found.

## 2018-12-09 NOTE — Discharge Summary (Signed)
Physician Discharge Summary  Pamela House CRF:[id number] DOB: 1930-06-02 DOA: 12/07/2018  PCP: Wenda Low, MD  Admit date: 12/07/2018 Discharge date: 12/09/2018  Admitted From: Home Disposition:  Home  Recommendations for Outpatient Follow-up:  1. Follow up with PCP in 1-2 weeks 2. Please obtain BMP/CBC in one week   Home Health: Yes HHRN, PT,RT, aide Equipment/Devices: 4LNC  Discharge Condition: Stable CODE STATUS: DNR Diet recommendation: Regular  Brief/Interim Summary:  #) Shortness of breath/interstitial fibrosis/idiopathic pulmonary fibrosis: Patient was admitted with shortness of breath and found to have hypoxia on her 2 L nasal cannula.  She is increased to 4 L nasal cannula with resolution of hypoxia.  CTA showed no evidence of acute pulmonary fibrosis but did show significant underlying disease lung disease and pulmonary arterial hypertension.  Patient was discharged on 4 L nasal cannula.  Patient was evaluated by physical therapy and skilled nursing facility was recommended however patient refused.  Patient will consider as an outpatient.  She was also sent home with home health respiratory therapy as well as an Hydrologist.  #) Pulmonary arterial hypertension: Likely secondary to underlying lung disease.  This was noted on echo.  Patient was noted to have tremendous RV pressures.  She was given IV diuresis here with some improvement in her shortness of breath and hypoxia.  Unfortunately she has minimal to any edema and so guiding diuresis has been difficult.  She will not be discharged on any furosemide at home.  Aside from oxygen she might be considered for a calcium channel blocker as an outpatient to initiate for pulmonary hypertension.  #) Hypertension: Patient was continued amlodipine, carvedilol, aspirin.  #) Dementia/pain/psych: Patient was continued on mirtazapine, donepezil, PRN alprazolam.  #) CML: This is currently not an active  issue.  Discharge Diagnoses:  Principal Problem:   Hypoxia Active Problems:   CML (chronic myelocytic leukemia) (HCC)   Hypertension   ILD (interstitial lung disease) ([place])   IPF (idiopathic pulmonary fibrosis) ([place])   DNR (do not resuscitate)    Discharge Instructions  Discharge Instructions    Call MD for:  difficulty breathing, headache or visual disturbances   Complete by:  As directed    Call MD for:  hives   Complete by:  As directed    Call MD for:  persistant nausea and vomiting   Complete by:  As directed    Call MD for:  redness, tenderness, or signs of infection (pain, swelling, redness, odor or green/yellow discharge around incision site)   Complete by:  As directed    Call MD for:  severe uncontrolled pain   Complete by:  As directed    Call MD for:  temperature >100.4   Complete by:  As directed    Diet - low sodium heart healthy   Complete by:  As directed    Discharge instructions   Complete by:  As directed    Please follow-up with your primary care doctor in 1 week.   Increase activity slowly   Complete by:  As directed      Allergies as of 12/09/2018      Reactions   Ace Inhibitors Cough   Hyzaar [losartan Potassium-hctz] Cough   Lipitor [atorvastatin]    Muscle fatigue   Losartan Cough   Tiazac [diltiazem Hcl Er Beads] Cough      Medication List    STOP taking these medications   azithromycin 250 MG tablet Commonly known as:  Zithromax  predniSONE 10 MG tablet Commonly known as:  DELTASONE     TAKE these medications   ALPRAZolam 0.5 MG tablet Commonly known as:  XANAX Take 1 tablet (0.5 mg total) by mouth 2 (two) times daily as needed for anxiety. Prescription needed for skilled nursing   amLODipine 5 MG tablet Commonly known as:  NORVASC Take 5 mg by mouth daily.   aspirin 81 MG tablet Take 81 mg by mouth daily.   benzonatate 200 MG capsule Commonly known as:  TESSALON Take 1 capsule (200 mg total) by mouth 3 (three) times  daily.   carvedilol 25 MG tablet Commonly known as:  COREG Take 1 tablet (25 mg total) by mouth 2 (two) times daily.   donepezil 5 MG tablet Commonly known as:  ARICEPT Take 5 mg by mouth at bedtime.   fluticasone 50 MCG/ACT nasal spray Commonly known as:  FLONASE Place 2 sprays into both nostrils daily.   guaiFENesin 100 MG/5ML liquid Commonly known as:  ROBITUSSIN Take 200 mg by mouth every 6 (six) hours as needed for cough.   latanoprost 0.005 % ophthalmic solution Commonly known as:  XALATAN Place 1 drop into both eyes at bedtime.   mirtazapine 15 MG tablet Commonly known as:  REMERON Take 15 mg by mouth at bedtime.   omeprazole 20 MG capsule Commonly known as:  PRILOSEC TAKE 1 CAPSULE BY MOUTH TWICE A DAY   polyethylene glycol packet Commonly known as:  MIRALAX / GLYCOLAX Take 17 g by mouth daily.   Vitamin D (Ergocalciferol) 1.25 MG (50000 UT) Caps capsule Commonly known as:  DRISDOL Take 50,000 Units by mouth every 30 (thirty) days.            Durable Medical Equipment  (From admission, onward)         Start     Ordered   12/09/18 0957  DME Oxygen  Once    Question Answer Comment  Mode or (Route) Mask   Liters per Minute 4   Frequency Continuous (stationary and portable oxygen unit needed)   Oxygen conserving device Yes   Oxygen delivery system Gas      12/09/18 0956          Allergies  Allergen Reactions  . Ace Inhibitors Cough  . Hyzaar [Losartan Potassium-Hctz] Cough  . Lipitor [Atorvastatin]     Muscle fatigue  . Losartan Cough  . Tiazac [Diltiazem Hcl Er Beads] Cough    Consultations:  None   Procedures/Studies: Dg Chest 2 View  Result Date: 12/07/2018 CLINICAL DATA:  Dyspnea.  History of hypertension and breast cancer. EXAM: CHEST - 2 VIEW COMPARISON:  11/27/2018 and older exams. FINDINGS: Mild enlargement of the cardiac silhouette. No mediastinal or hilar masses. Lungs show heterogeneous coarse interstitial thickening with  intervening hazy airspace type opacities, which predominate in the peripheral lungs and at the bases, without significant change from prior exams. No pleural effusion or pneumothorax. Skeletal structures are intact. IMPRESSION: 1. Advanced interstitial lung disease. Superimposed pneumonia is possible but not definitively seen. No evidence of pulmonary edema. 2. Mild stable cardiomegaly. Electronically Signed   By: Lajean Manes M.D.   On: 12/07/2018 18:10   Dg Chest 2 View  Result Date: 11/27/2018 CLINICAL DATA:  Weakness for 3 days. EXAM: CHEST - 2 VIEW COMPARISON:  Chest radiographs 11/02/2018 and CT 11/06/2018 FINDINGS: The cardiomediastinal silhouette is unchanged with mild cardiomegaly again noted. There is chronic elevation of the right hemidiaphragm. Lung volumes are unchanged with similar appearance  of extensive chronic interstitial lung disease compared to the prior radiographs. No definite acute airspace consolidation, pleural effusion, pneumothorax is identified. No acute osseous abnormality is seen. IMPRESSION: Chronic interstitial lung disease without evidence of acute abnormality. Electronically Signed   By: Logan Bores M.D.   On: 11/27/2018 15:52   Ct Angio Chest Pe W And/or Wo Contrast  Result Date: 12/07/2018 CLINICAL DATA:  85 year old female on home oxygen with chest pain and shortness of breath. EXAM: CT ANGIOGRAPHY CHEST WITH CONTRAST TECHNIQUE: Multidetector CT imaging of the chest was performed using the standard protocol during bolus administration of intravenous contrast. Multiplanar CT image reconstructions and MIPs were obtained to evaluate the vascular anatomy. CONTRAST:  99m OMNIPAQUE IOHEXOL 350 MG/ML SOLN COMPARISON:  Chest radiographs earlier today. Chest CT 11/06/2018. FINDINGS: Cardiovascular: Excellent contrast bolus timing in the pulmonary arterial tree. Mild respiratory motion. No focal filling defect identified in the pulmonary arteries to suggest acute pulmonary  embolism. Chronic pulmonary artery enlargement. Chronic Calcified aortic atherosclerosis. Calcified coronary artery atherosclerosis. Stable cardiomegaly. No pericardial effusion. Mediastinum/Nodes: Stable, negative. Lungs/Pleura: Extensive chronic interstitial and fibrotic appearing lung disease with peripheral and right basilar areas of honeycombing and bronchiectasis. Stable low lung volumes. Stable major airway patency. No acute pulmonary opacity identified. No pleural effusion. Upper Abdomen: Negative visible liver, spleen, pancreas, kidneys, and stomach. Musculoskeletal: Stable visualized osseous structures. Mild chronic T11 superior endplate compression. Review of the MIP images confirms the above findings. IMPRESSION: 1. No evidence of acute pulmonary embolus. Chronic pulmonary artery enlargement compatible with a degree of pulmonary artery hypertension. 2. Chronic pulmonary fibrosis. No acute pulmonary abnormality. 3. Calcified aortic and coronary artery atherosclerosis. Stable cardiomegaly. Electronically Signed   By: HGenevie AnnM.D.   On: 12/07/2018 22:31      Subjective:   Discharge Exam: Vitals:   12/09/18 0548 12/09/18 0921  BP: (!) 91/50 118/64  Pulse: 64 63  Resp: 16   Temp: 98.4 F (36.9 C)   SpO2: 100%    Vitals:   12/08/18 1418 12/08/18 2040 12/09/18 0548 12/09/18 0921  BP: 97/74 (!) 96/58 (!) 91/50 118/64  Pulse: 66 74 64 63  Resp:  18 16   Temp:  98.8 F (37.1 C) 98.4 F (36.9 C)   TempSrc:  Oral Oral   SpO2: 98% 97% 100%   Weight:      Height:        General exam: Appears calm and comfortable  Respiratory system: Clear to auscultation. Respiratory effort normal. Cardiovascular system: Distant heart sounds, regular rate and rhythm, no murmurs Gastrointestinal system: Soft, nondistended, no rebound or guarding, plus bowel sounds Central nervous system: Alert and oriented.  Grossly intact, moving all extremities Extremities: No lower extremity edema Skin: No  rashes over visible skin Psychiatry: Unable to assess due to underlying dementia    The results of significant diagnostics from this hospitalization (including imaging, microbiology, ancillary and laboratory) are listed below for reference.     Microbiology: No results found for this or any previous visit (from the past 240 hour(s)).   Labs: BNP (last 3 results) Recent Labs    11/02/18 1200 12/07/18 1559  BNP 409.2* 8106.2   Basic Metabolic Panel: Recent Labs  Lab 12/07/18 1638 12/08/18 0116 12/09/18 0448  NA 135 133* 138  K 4.1 3.5 4.1  CL 95* 93* 93*  CO2 30 30 38*  GLUCOSE 94 144* 95  BUN 16 17 25*  CREATININE 0.59 0.71 0.97  CALCIUM 9.9 9.6 9.3  MG  --   --  2.0   Liver Function Tests: Recent Labs  Lab 12/08/18 0116  AST 22  ALT 18  ALKPHOS 65  BILITOT 0.8  PROT 7.2  ALBUMIN 3.4*   No results for input(s): LIPASE, AMYLASE in the last 168 hours. No results for input(s): AMMONIA in the last 168 hours. CBC: Recent Labs  Lab 12/07/18 1559 12/08/18 0116  WBC 6.5 4.4  NEUTROABS 5.1  --   HGB 11.6* 11.5*  HCT 37.2 36.6  MCV 93.7 91.7  PLT 238 234   Cardiac Enzymes: Recent Labs  Lab 12/07/18 1559  TROPONINI 0.03*   BNP: Invalid input(s): POCBNP CBG: No results for input(s): GLUCAP in the last 168 hours. D-Dimer No results for input(s): DDIMER in the last 72 hours. Hgb A1c No results for input(s): HGBA1C in the last 72 hours. Lipid Profile No results for input(s): CHOL, HDL, LDLCALC, TRIG, CHOLHDL, LDLDIRECT in the last 72 hours. Thyroid function studies No results for input(s): TSH, T4TOTAL, T3FREE, THYROIDAB in the last 72 hours.  Invalid input(s): FREET3 Anemia work up No results for input(s): VITAMINB12, FOLATE, FERRITIN, TIBC, IRON, RETICCTPCT in the last 72 hours. Urinalysis    Component Value Date/Time   COLORURINE YELLOW 11/27/2018 1726   APPEARANCEUR CLEAR 11/27/2018 1726   LABSPEC 1.012 11/27/2018 1726   LABSPEC 1.010  10/21/2015 1135   PHURINE 7.0 11/27/2018 1726   GLUCOSEU NEGATIVE 11/27/2018 1726   GLUCOSEU Negative 10/21/2015 1135   HGBUR NEGATIVE 11/27/2018 1726   BILIRUBINUR NEGATIVE 11/27/2018 1726   BILIRUBINUR Negative 10/21/2015 1135   KETONESUR NEGATIVE 11/27/2018 1726   PROTEINUR NEGATIVE 11/27/2018 1726   UROBILINOGEN 0.2 10/21/2015 1135   NITRITE NEGATIVE 11/27/2018 1726   LEUKOCYTESUR NEGATIVE 11/27/2018 1726   LEUKOCYTESUR Negative 10/21/2015 1135   Sepsis Labs Invalid input(s): PROCALCITONIN,  WBC,  LACTICIDVEN Microbiology No results found for this or any previous visit (from the past 240 hour(s)).   Time coordinating discharge: 35  SIGNED:   Cristy Folks, MD  [facility] 12/09/2018, 9:56 AM  If 7PM-7AM, please contact night-coverage [email or web address] Password [email or web address]

## 2018-12-20 ENCOUNTER — Encounter (HOSPITAL_COMMUNITY): Payer: Self-pay

## 2018-12-20 ENCOUNTER — Other Ambulatory Visit: Payer: Self-pay

## 2018-12-20 ENCOUNTER — Inpatient Hospital Stay (HOSPITAL_COMMUNITY)
Admission: EM | Admit: 2018-12-20 | Discharge: 2018-12-23 | DRG: 189 | Disposition: A | Discharge status: Hospice | Attending: Internal Medicine | Admitting: Internal Medicine

## 2018-12-20 ENCOUNTER — Emergency Department (HOSPITAL_COMMUNITY)

## 2018-12-20 DIAGNOSIS — Z681 Body mass index (BMI) 19 or less, adult: Secondary | ICD-10-CM | POA: Diagnosis not present

## 2018-12-20 DIAGNOSIS — F0390 Unspecified dementia without behavioral disturbance: Secondary | ICD-10-CM

## 2018-12-20 DIAGNOSIS — F419 Anxiety disorder, unspecified: Secondary | ICD-10-CM | POA: Diagnosis present

## 2018-12-20 DIAGNOSIS — J9621 Acute and chronic respiratory failure with hypoxia: Secondary | ICD-10-CM | POA: Diagnosis present

## 2018-12-20 DIAGNOSIS — E559 Vitamin D deficiency, unspecified: Secondary | ICD-10-CM | POA: Diagnosis present

## 2018-12-20 DIAGNOSIS — R6889 Other general symptoms and signs: Secondary | ICD-10-CM | POA: Diagnosis not present

## 2018-12-20 DIAGNOSIS — R0602 Shortness of breath: Secondary | ICD-10-CM

## 2018-12-20 DIAGNOSIS — Z7951 Long term (current) use of inhaled steroids: Secondary | ICD-10-CM

## 2018-12-20 DIAGNOSIS — T451X5S Adverse effect of antineoplastic and immunosuppressive drugs, sequela: Secondary | ICD-10-CM

## 2018-12-20 DIAGNOSIS — Z853 Personal history of malignant neoplasm of breast: Secondary | ICD-10-CM

## 2018-12-20 DIAGNOSIS — I2721 Secondary pulmonary arterial hypertension: Secondary | ICD-10-CM | POA: Diagnosis present

## 2018-12-20 DIAGNOSIS — Z9221 Personal history of antineoplastic chemotherapy: Secondary | ICD-10-CM | POA: Diagnosis not present

## 2018-12-20 DIAGNOSIS — R739 Hyperglycemia, unspecified: Secondary | ICD-10-CM | POA: Diagnosis present

## 2018-12-20 DIAGNOSIS — I427 Cardiomyopathy due to drug and external agent: Secondary | ICD-10-CM | POA: Diagnosis present

## 2018-12-20 DIAGNOSIS — I110 Hypertensive heart disease with heart failure: Secondary | ICD-10-CM | POA: Diagnosis present

## 2018-12-20 DIAGNOSIS — Z7982 Long term (current) use of aspirin: Secondary | ICD-10-CM

## 2018-12-20 DIAGNOSIS — E871 Hypo-osmolality and hyponatremia: Secondary | ICD-10-CM | POA: Diagnosis present

## 2018-12-20 DIAGNOSIS — J84112 Idiopathic pulmonary fibrosis: Secondary | ICD-10-CM | POA: Diagnosis present

## 2018-12-20 DIAGNOSIS — I509 Heart failure, unspecified: Secondary | ICD-10-CM | POA: Diagnosis present

## 2018-12-20 DIAGNOSIS — J849 Interstitial pulmonary disease, unspecified: Secondary | ICD-10-CM | POA: Diagnosis not present

## 2018-12-20 DIAGNOSIS — E43 Unspecified severe protein-calorie malnutrition: Secondary | ICD-10-CM | POA: Diagnosis present

## 2018-12-20 DIAGNOSIS — Z9981 Dependence on supplemental oxygen: Secondary | ICD-10-CM | POA: Diagnosis not present

## 2018-12-20 DIAGNOSIS — K219 Gastro-esophageal reflux disease without esophagitis: Secondary | ICD-10-CM | POA: Diagnosis present

## 2018-12-20 DIAGNOSIS — Z66 Do not resuscitate: Secondary | ICD-10-CM | POA: Diagnosis present

## 2018-12-20 DIAGNOSIS — Z20828 Contact with and (suspected) exposure to other viral communicable diseases: Secondary | ICD-10-CM | POA: Diagnosis present

## 2018-12-20 DIAGNOSIS — R778 Other specified abnormalities of plasma proteins: Secondary | ICD-10-CM

## 2018-12-20 DIAGNOSIS — E873 Alkalosis: Secondary | ICD-10-CM | POA: Diagnosis not present

## 2018-12-20 DIAGNOSIS — J8410 Pulmonary fibrosis, unspecified: Secondary | ICD-10-CM

## 2018-12-20 DIAGNOSIS — Z923 Personal history of irradiation: Secondary | ICD-10-CM

## 2018-12-20 DIAGNOSIS — I248 Other forms of acute ischemic heart disease: Secondary | ICD-10-CM | POA: Diagnosis present

## 2018-12-20 DIAGNOSIS — C9210 Chronic myeloid leukemia, BCR/ABL-positive, not having achieved remission: Secondary | ICD-10-CM | POA: Diagnosis present

## 2018-12-20 DIAGNOSIS — Z888 Allergy status to other drugs, medicaments and biological substances status: Secondary | ICD-10-CM

## 2018-12-20 DIAGNOSIS — Z809 Family history of malignant neoplasm, unspecified: Secondary | ICD-10-CM

## 2018-12-20 DIAGNOSIS — I10 Essential (primary) hypertension: Secondary | ICD-10-CM | POA: Diagnosis present

## 2018-12-20 DIAGNOSIS — M810 Age-related osteoporosis without current pathological fracture: Secondary | ICD-10-CM | POA: Diagnosis present

## 2018-12-20 DIAGNOSIS — R7989 Other specified abnormal findings of blood chemistry: Secondary | ICD-10-CM

## 2018-12-20 DIAGNOSIS — I159 Secondary hypertension, unspecified: Secondary | ICD-10-CM | POA: Diagnosis not present

## 2018-12-20 DIAGNOSIS — Z8249 Family history of ischemic heart disease and other diseases of the circulatory system: Secondary | ICD-10-CM

## 2018-12-20 DIAGNOSIS — J309 Allergic rhinitis, unspecified: Secondary | ICD-10-CM | POA: Diagnosis present

## 2018-12-20 DIAGNOSIS — E785 Hyperlipidemia, unspecified: Secondary | ICD-10-CM | POA: Diagnosis present

## 2018-12-20 DIAGNOSIS — I5033 Acute on chronic diastolic (congestive) heart failure: Secondary | ICD-10-CM

## 2018-12-20 LAB — RESPIRATORY PANEL BY PCR

## 2018-12-20 LAB — CBC WITH DIFFERENTIAL/PLATELET
Abs Immature Granulocytes: 0.02 10*3/uL (ref 0.00–0.07)
Basophils Absolute: 0 10*3/uL (ref 0.0–0.1)
Basophils Relative: 1 %
Eosinophils Absolute: 0.1 10*3/uL (ref 0.0–0.5)
Eosinophils Relative: 1 %
HCT: 34.4 % — ABNORMAL LOW (ref 36.0–46.0)
Hemoglobin: 10.6 g/dL — ABNORMAL LOW (ref 12.0–15.0)
Immature Granulocytes: 0 %
Lymphocytes Relative: 12 %
Lymphs Abs: 0.7 10*3/uL (ref 0.7–4.0)
MCH: 28.3 pg (ref 26.0–34.0)
MCHC: 30.8 g/dL (ref 30.0–36.0)
MCV: 92 fL (ref 80.0–100.0)
Monocytes Absolute: 0.4 10*3/uL (ref 0.1–1.0)
Monocytes Relative: 7 %
Neutro Abs: 4.5 10*3/uL (ref 1.7–7.7)
Neutrophils Relative %: 79 %
Platelets: 306 10*3/uL (ref 150–400)
RBC: 3.74 MIL/uL — ABNORMAL LOW (ref 3.87–5.11)
RDW: 13.6 % (ref 11.5–15.5)
WBC: 5.7 10*3/uL (ref 4.0–10.5)
nRBC: 0 % (ref 0.0–0.2)

## 2018-12-20 LAB — COMPREHENSIVE METABOLIC PANEL
ALT: 39 U/L (ref 0–44)
AST: 46 U/L — ABNORMAL HIGH (ref 15–41)
Albumin: 3.5 g/dL (ref 3.5–5.0)
Alkaline Phosphatase: 82 U/L (ref 38–126)
Anion gap: 10 (ref 5–15)
BUN: 18 mg/dL (ref 8–23)
CO2: 30 mmol/L (ref 22–32)
Calcium: 9.9 mg/dL (ref 8.9–10.3)
Chloride: 92 mmol/L — ABNORMAL LOW (ref 98–111)
Creatinine, Ser: 0.84 mg/dL (ref 0.44–1.00)
GFR calc Af Amer: >60 mL/min (ref >60)
GFR calc non Af Amer: >60 mL/min (ref >60)
Glucose, Bld: 132 mg/dL — ABNORMAL HIGH (ref 70–99)
Potassium: 4.2 mmol/L (ref 3.5–5.1)
Sodium: 132 mmol/L — ABNORMAL LOW (ref 135–145)
Total Bilirubin: 0.5 mg/dL (ref 0.3–1.2)
Total Protein: 6.8 g/dL (ref 6.5–8.1)

## 2018-12-20 LAB — POCT I-STAT EG7
Acid-Base Excess: 8 mmol/L — ABNORMAL HIGH (ref 0.0–2.0)
Bicarbonate: 35.3 mmol/L — ABNORMAL HIGH (ref 20.0–28.0)
Calcium, Ion: 1.28 mmol/L (ref 1.15–1.40)
HCT: 35 % — ABNORMAL LOW (ref 36.0–46.0)
Hemoglobin: 11.9 g/dL — ABNORMAL LOW (ref 12.0–15.0)
O2 Saturation: 88 %
Potassium: 4.2 mmol/L (ref 3.5–5.1)
Sodium: 133 mmol/L — ABNORMAL LOW (ref 135–145)
TCO2: 37 mmol/L — ABNORMAL HIGH (ref 22–32)
pCO2, Ven: 63.1 mmHg — ABNORMAL HIGH (ref 44.0–60.0)
pH, Ven: 7.356 (ref 7.250–7.430)
pO2, Ven: 59 mmHg — ABNORMAL HIGH (ref 32.0–45.0)

## 2018-12-20 LAB — CBC
HCT: 36.1 % (ref 36.0–46.0)
Hemoglobin: 11.4 g/dL — ABNORMAL LOW (ref 12.0–15.0)
MCH: 28.4 pg (ref 26.0–34.0)
MCHC: 31.6 g/dL (ref 30.0–36.0)
MCV: 89.8 fL (ref 80.0–100.0)
Platelets: 351 10*3/uL (ref 150–400)
RBC: 4.02 MIL/uL (ref 3.87–5.11)
RDW: 13.5 % (ref 11.5–15.5)
WBC: 5.5 10*3/uL (ref 4.0–10.5)
nRBC: 0 % (ref 0.0–0.2)

## 2018-12-20 LAB — BRAIN NATRIURETIC PEPTIDE: B Natriuretic Peptide: 1281.7 pg/mL — ABNORMAL HIGH (ref 0.0–100.0)

## 2018-12-20 LAB — C-REACTIVE PROTEIN: CRP: <0.8 mg/dL (ref <1.0)

## 2018-12-20 LAB — LACTATE DEHYDROGENASE: LDH: 232 U/L — ABNORMAL HIGH (ref 98–192)

## 2018-12-20 LAB — TROPONIN I
Troponin I: 0.52 ng/mL (ref <0.03)
Troponin I: 0.53 ng/mL (ref <0.03)

## 2018-12-20 LAB — PROCALCITONIN: Procalcitonin: <0.1 ng/mL

## 2018-12-20 LAB — D-DIMER, QUANTITATIVE (NOT AT ARMC): D-Dimer, Quant: 3.16 ug/mL-FEU — ABNORMAL HIGH (ref 0.00–0.50)

## 2018-12-20 LAB — CREATININE, SERUM
Creatinine, Ser: 0.92 mg/dL (ref 0.44–1.00)
GFR calc Af Amer: >60 mL/min (ref >60)
GFR calc non Af Amer: 56 mL/min — ABNORMAL LOW (ref >60)

## 2018-12-20 LAB — FERRITIN: Ferritin: 65 ng/mL (ref 11–307)

## 2018-12-20 LAB — SEDIMENTATION RATE: Sed Rate: 26 mm/hr — ABNORMAL HIGH (ref 0–22)

## 2018-12-20 MED ORDER — ENSURE ENLIVE PO LIQD
237.0000 mL | Freq: Two times a day (BID) | ORAL | Status: DC
  Administered 2018-12-20 – 2018-12-21 (×2): 237 mL via ORAL

## 2018-12-20 MED ORDER — PANTOPRAZOLE SODIUM 40 MG PO TBEC
40.0000 mg | DELAYED_RELEASE_TABLET | Freq: Every day | ORAL | Status: DC
  Administered 2018-12-20 – 2018-12-23 (×4): 40 mg via ORAL
  Filled 2018-12-20 (×4): qty 1

## 2018-12-20 MED ORDER — ENOXAPARIN SODIUM 40 MG/0.4ML ~~LOC~~ SOLN
40.0000 mg | SUBCUTANEOUS | Status: DC
  Administered 2018-12-20 – 2018-12-22 (×3): 40 mg via SUBCUTANEOUS
  Filled 2018-12-20 (×3): qty 0.4

## 2018-12-20 MED ORDER — MIRTAZAPINE 15 MG PO TABS
15.0000 mg | ORAL_TABLET | Freq: Every day | ORAL | Status: DC
  Administered 2018-12-20 – 2018-12-22 (×3): 15 mg via ORAL
  Filled 2018-12-20 (×3): qty 1

## 2018-12-20 MED ORDER — ALPRAZOLAM 0.5 MG PO TABS
0.5000 mg | ORAL_TABLET | Freq: Two times a day (BID) | ORAL | Status: DC | PRN
  Administered 2018-12-22 – 2018-12-23 (×2): 0.5 mg via ORAL
  Filled 2018-12-20 (×2): qty 1

## 2018-12-20 MED ORDER — ONDANSETRON HCL 4 MG PO TABS: 4.0000 mg | ORAL_TABLET | Freq: Four times a day (QID) | ORAL | Status: DC | PRN

## 2018-12-20 MED ORDER — ASPIRIN 81 MG PO CHEW
81.0000 mg | CHEWABLE_TABLET | Freq: Every day | ORAL | Status: DC
  Administered 2018-12-20 – 2018-12-23 (×4): 81 mg via ORAL
  Filled 2018-12-20 (×4): qty 1

## 2018-12-20 MED ORDER — CARVEDILOL 25 MG PO TABS
25.0000 mg | ORAL_TABLET | Freq: Two times a day (BID) | ORAL | Status: DC
  Administered 2018-12-20 – 2018-12-23 (×7): 25 mg via ORAL
  Filled 2018-12-20 (×7): qty 1

## 2018-12-20 MED ORDER — DONEPEZIL HCL 5 MG PO TABS
5.0000 mg | ORAL_TABLET | Freq: Every day | ORAL | Status: DC
  Administered 2018-12-20 – 2018-12-22 (×3): 5 mg via ORAL
  Filled 2018-12-20 (×3): qty 1

## 2018-12-20 MED ORDER — ACETAMINOPHEN 325 MG PO TABS
650.0000 mg | ORAL_TABLET | Freq: Four times a day (QID) | ORAL | Status: DC | PRN
  Administered 2018-12-22: 650 mg via ORAL
  Filled 2018-12-20: qty 2

## 2018-12-20 MED ORDER — AMLODIPINE BESYLATE 5 MG PO TABS
5.0000 mg | ORAL_TABLET | Freq: Every day | ORAL | Status: DC
  Administered 2018-12-20 – 2018-12-23 (×4): 5 mg via ORAL
  Filled 2018-12-20 (×4): qty 1

## 2018-12-20 MED ORDER — ASPIRIN 325 MG PO TABS
325.0000 mg | ORAL_TABLET | Freq: Once | ORAL | Status: AC
  Administered 2018-12-20: 325 mg via ORAL
  Filled 2018-12-20: qty 1

## 2018-12-20 MED ORDER — METHYLPREDNISOLONE SODIUM SUCC 125 MG IJ SOLR
125.0000 mg | Freq: Once | INTRAMUSCULAR | Status: AC
  Administered 2018-12-20: 10:00:00 125 mg via INTRAVENOUS
  Filled 2018-12-20: qty 2

## 2018-12-20 MED ORDER — FUROSEMIDE 10 MG/ML IJ SOLN
40.0000 mg | Freq: Once | INTRAMUSCULAR | Status: AC
  Administered 2018-12-20: 12:00:00 40 mg via INTRAVENOUS
  Filled 2018-12-20: qty 4

## 2018-12-20 MED ORDER — ONDANSETRON HCL 4 MG/2ML IJ SOLN: 4.0000 mg | Freq: Four times a day (QID) | INTRAMUSCULAR | Status: DC | PRN

## 2018-12-20 NOTE — ED Notes (Signed)
Radiology at bedside for CXR

## 2018-12-20 NOTE — ED Provider Notes (Signed)
[facility] EMERGENCY DEPARTMENT Provider Note   CSN: [id number] Arrival date & time: 12/20/18  5726    History   Chief Complaint Chief Complaint  Patient presents with  . Shortness of Breath    HPI Pamela House is a 85 y.o. female hx of dementia, HL, leukemia here presenting with shortness of breath, hypoxia.  Patient was recently admitted for CHF exacerbation.  Patient was sent home on 4 L [place]. Patient called EMS yesterday stating that she had shortness of breath. Unclear if EMS went out to the house yesterday. Today, she called EMS again and EMS came and she was noted to not have her oxygen on and her O2 was 52% on RA. Patient was put on nonrebreather and remained hypoxic around 85%. No fever or cough. No recent travels or sick contacts      The history is provided by the patient.  Level V caveat- dementia, AMS   Past Medical History:  Diagnosis Date  . Allergic rhinitis   . Anxiety   . Breast cancer ([place]) 2016   left breast  . DDD (degenerative disc disease)   . Dementia ([place])   . Dyslipidemia   . GERD (gastroesophageal reflux disease)   . Hypertension   . Leukemia ([place]) 2010  . Osteoporosis   . Personal history of chemotherapy 2016  . Personal history of radiation therapy 2016  . Radiation 04/30/15-05/1515   Left breast  . Vitamin D deficiency     Patient Active Problem List   Diagnosis Date Noted  . Hypoxia 12/07/2018  . Cough   . Protein-calorie malnutrition, severe 11/06/2018  . IPF (idiopathic pulmonary fibrosis) ([place])   . Palliative care encounter   . DNR (do not resuscitate)   . Dyspnea 11/02/2018  . Hyponatremia 11/02/2018  . 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 Diarrhea 06/20/2015  . Malignant neoplasm of upper-outer quadrant of left breast in female, estrogen receptor negative ([place]) 02/07/2015  . Chronic cough 06/19/2013  . ILD (interstitial lung disease) ([place]) 06/19/2013  . Hypertension 02/21/2013  . CML  (chronic myelocytic leukemia) ([place]) 05/23/2012    Past Surgical History:  Procedure Laterality Date  . BACK SURGERY  1984  . BREAST LUMPECTOMY Left 2016  . BREAST LUMPECTOMY WITH RADIOACTIVE SEED AND SENTINEL LYMPH NODE BIOPSY Left 02/27/2015   Procedure: LEFT BREAST LUMPECTOMY WITH RADIOACTIVE SEED AND SENTINEL LYMPH NODE BIOPSY;  Surgeon: Autumn Messing III, MD;  Location: [facility];  Service: General;  Laterality: Left;     OB History   No obstetric history on file.      Home Medications    Prior to Admission medications   Medication Sig Start Date End Date Taking? Authorizing Provider  ALPRAZolam Duanne Moron) 0.5 MG tablet Take 1 tablet (0.5 mg total) by mouth 2 (two) times daily as needed for anxiety. Prescription needed for skilled nursing 11/08/18   Donne Hazel, MD  amLODipine (NORVASC) 5 MG tablet Take 5 mg by mouth daily. 10/25/18   [provider]  aspirin 81 MG tablet Take 81 mg by mouth daily.    [provider]  benzonatate (TESSALON) 200 MG capsule Take 1 capsule (200 mg total) by mouth 3 (three) times daily. 11/08/18   Donne Hazel, MD  carvedilol (COREG) 25 MG tablet Take 1 tablet (25 mg total) by mouth 2 (two) times daily. 10/19/17   Magrinat, [name], MD  donepezil (ARICEPT) 5 MG tablet Take 5 mg by mouth  at bedtime.    [provider]  fluticasone (FLONASE) 50 MCG/ACT nasal spray Place 2 sprays into both nostrils daily. 09/10/18   [provider]  guaiFENesin (ROBITUSSIN) 100 MG/5ML liquid Take 200 mg by mouth every 6 (six) hours as needed for cough.    [provider]  latanoprost (XALATAN) 0.005 % ophthalmic solution Place 1 drop into both eyes at bedtime. 09/10/18   [provider]  mirtazapine (REMERON) 15 MG tablet Take 15 mg by mouth at bedtime.  07/13/17   [provider]  omeprazole (PRILOSEC) 20 MG capsule TAKE 1 CAPSULE BY MOUTH TWICE A DAY 10/25/18   Magrinat, [name], MD  polyethylene  glycol (MIRALAX / GLYCOLAX) packet Take 17 g by mouth daily.    [provider]  Vitamin D, Ergocalciferol, (DRISDOL) 50000 units CAPS capsule Take 50,000 Units by mouth every 30 (thirty) days.    [provider]    Family History History reviewed. No pertinent family history.  Social History Social History   Tobacco Use  . Smoking status: Never Smoker  . Smokeless tobacco: Never Used  Substance Use Topics  . Alcohol use: No    Alcohol/week: 0.0 standard drinks  . Drug use: No     Allergies   Ace inhibitors; Hyzaar [losartan potassium-hctz]; Lipitor [atorvastatin]; Losartan; and Tiazac [diltiazem hcl er beads]   Review of Systems Review of Systems  Respiratory: Positive for shortness of breath.   All other systems reviewed and are negative.    Physical Exam Updated Vital Signs BP 139/73   Pulse 68   Temp 98.5 F (36.9 C) (Rectal)   Resp 19   Ht _0  (1.6 m)   Wt 36 kg   SpO2 100%   BMI 14.06 kg/m   Physical Exam Vitals signs and nursing note reviewed.  HENT:     Head: Normocephalic.  Eyes:     Pupils: Pupils are equal, round, and reactive to light.  Neck:     Musculoskeletal: Normal range of motion.  Cardiovascular:     Rate and Rhythm: Normal rate and regular rhythm.  Pulmonary:     Comments: Slightly tachypneic, dry crackles throughout  Musculoskeletal:     Comments: Trace edema bilaterally   Skin:    General: Skin is warm.     Capillary Refill: Capillary refill takes less than 2 seconds.  Neurological:     General: No focal deficit present.     Mental Status: She is alert.     Comments: Demented, A & O x 1. Moving all extremities   Psychiatric:        Mood and Affect: Mood normal.      ED Treatments / Results  Labs (all labs ordered are listed, but only abnormal results are displayed) Labs Reviewed  CBC WITH DIFFERENTIAL/PLATELET - Abnormal; Notable for the following components:      Result Value   RBC 3.74 (*)     Hemoglobin 10.6 (*)    HCT 34.4 (*)    All other components within normal limits  COMPREHENSIVE METABOLIC PANEL - Abnormal; Notable for the following components:   Sodium 132 (*)    Chloride 92 (*)    Glucose, Bld 132 (*)    AST 46 (*)    All other components within normal limits  TROPONIN I - Abnormal; Notable for the following components:   Troponin I 0.52 (*)    All other components within normal limits  BRAIN NATRIURETIC PEPTIDE - Abnormal;  Notable for the following components:   B Natriuretic Peptide 1,281.7 (*)    All other components within normal limits  POCT I-STAT EG7 - Abnormal; Notable for the following components:   pCO2, Ven 63.1 (*)    pO2, Ven 59.0 (*)    Bicarbonate 35.3 (*)    TCO2 37 (*)    Acid-Base Excess 8.0 (*)    Sodium 133 (*)    HCT 35.0 (*)    Hemoglobin 11.9 (*)    All other components within normal limits    EKG EKG Interpretation  Date/Time:  Wednesday December 20 2018 09:34:06 EDT Ventricular Rate:  75 PR Interval:    QRS Duration: 125 QT Interval:  439 QTC Calculation: 491 R Axis:   178 Text Interpretation:  Sinus rhythm LAE, consider biatrial enlargement RBBB and LPFB Repol abnrm suggests ischemia, diffuse leads Artifact in lead(s) I II aVR aVL aVF V1 V2 V3 V6 No significant change since last tracing Confirmed by Wandra Arthurs [id number]) on 12/20/2018 9:39:50 AM Also confirmed by Wandra Arthurs [id number]), editor Philomena Doheny [phone])  on 12/20/2018 9:48:27 AM   Radiology Dg Chest Port 1 View  Result Date: 12/20/2018 CLINICAL DATA:  Shortness of breath, cough EXAM: PORTABLE CHEST 1 VIEW COMPARISON:  12/07/2018 FINDINGS: Cardiomegaly. Diffuse peripheral interstitial opacities are again noted compatible with advanced interstitial lung disease, stable. No acute confluent airspace opacities or effusions. No acute bony abnormality. IMPRESSION: Advanced chronic interstitial lung disease/fibrosis, stable. Cardiomegaly. No definite acute process. Electronically  Signed   By: Rolm Baptise M.D.   On: 12/20/2018 09:51    Procedures Procedures (including critical care time)  CRITICAL CARE Performed by: Wandra Arthurs   Total critical care time: 30 minutes  Critical care time was exclusive of separately billable procedures and treating other patients.  Critical care was necessary to treat or prevent imminent or life-threatening deterioration.  Critical care was time spent personally by me on the following activities: development of treatment plan with patient and/or surrogate as well as nursing, discussions with consultants, evaluation of patient's response to treatment, examination of patient, obtaining history from patient or surrogate, ordering and performing treatments and interventions, ordering and review of laboratory studies, ordering and review of radiographic studies, pulse oximetry and re-evaluation of patient's condition.   Medications Ordered in ED Medications  furosemide (LASIX) injection 40 mg (has no administration in time range)  aspirin tablet 325 mg (has no administration in time range)  methylPREDNISolone sodium succinate (SOLU-MEDROL) 125 mg/2 mL injection 125 mg (125 mg Intravenous Given 12/20/18 1001)     Initial Impression / Assessment and Plan / ED Course  I have reviewed the triage vital signs and the nursing notes.  Pertinent labs & imaging results that were available during my care of the patient were reviewed by me and considered in my medical decision making (see chart for details).       Pamela House is a 85 y.o. female hx of pulmonary fibrosis here with hypoxia. Her O2 is 85% on nonrebreather. She had CTA a week ago that showed no PE. I am concerned for either CHF exacerbation vs worsening pulmonary fibrosis vs pneumonia. Afebrile in the ED. Will get labs, BNP, CXR. Will contact case management regarding her hospice situation.   11:29 AM I talked to both hospice as well as daughter.  Patient just went to hospice  yesterday.  Patient apparently took off her oxygen this morning.  Patient is DNR.  Patient now requiring  high flow oxygen around 10 L.  The VBG on nonrebreather showed a normal pH but her PCO2 is 60.  Her troponin and BNP is elevated.  She likely has demand ischemia secondary to CHF and pulmonary fibrosis.  Was given IV steroids as well as IV Lasix.  Given increasing oxygen requirement, will admit to the hospital. Daughter in agreement.    Final Clinical Impressions(s) / ED Diagnoses   Final diagnoses:  None    ED Discharge Orders    None       Drenda Freeze, MD 12/20/18 1131

## 2018-12-20 NOTE — Progress Notes (Addendum)
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 Swinson is under hospice services with [place], diagnosis of COPD per Dr. Lyman Speller.  Ms. Myung was left alone this am while her daughter ran an errand.  While she was gone, pt removed her O2 and became SOB, activated EMS and was transported prior to her daughter or hospice being made aware.  She is admitted with SOB, increasing O2 requirements, acute on chronic respiratory failure.  Spoke with daughter West Carbo.  Pt lives with [place] and she provides information about her.  She states her mother was probably anxious and called EMS.  No complaints from pt other than SOB.  V/S:  99.3 oral, 142/82, 77, RR 22, SPO2 99% HFNC @ 6 lpm I&O:  None recorded so far Lab work:  VBG pH 7.356, pCO2 63, pO2 59, acid base 8, bicarb 35, SPO2 88; hgb 11.9, HCT 35, novel coronavirus collected but not yet resulted, [id number], troponin 0.52  Problem List: Acute on chronic respiratory failure- now on HFNC 6lpm, stable CXR, covid 19 and RSV ordered, not yet resulted.  On airborne contact. Elevated troponin- thought to be demand, no complaints of CP  IDT:  Updated.  Discussed with [facility] team as well as LCSW in ED. GOC:  D/c home with hospice support once medically ready.  Her daughter did not necessarily want her sent to hospital, but pt activated EMS herself. PCG:  Updated [place] by phone  Due to hospital restrictions related to covid 19, unable to visit patient.    Transfer summary and medication list faxed to unit to be placed on shadow chart.  Once pt ready for discharge, if EMS transportation is needed, please use [facility] as they contract this service for [facility].  Thank you, Venia Carbon RN, BSN, [place] [facility] Liaison (in [place] under [facility] and [place]) [phone] (main #)

## 2018-12-20 NOTE — H&P (Addendum)
History and Physical    Pamela House EHO:[id number] DOB: [birth year] DOA: 12/20/2018  PCP: Wenda Low, MD  Patient coming from: Home, enrolled with home hospice currently   Chief Complaint: Shortness of breath  HPI: Pamela House is a 85 y.o. female with medical history significant of pulmonary fibrosis, chronic hypoxemic respiratory failure on 2-4 L nasal cannula O2, dementia. She was recently discharged from the hospital on 12/09/2018 after she was admitted for shortness of breath.  At the time of admission, she had baseline 2 L nasal cannula O2 requirement which was bumped up to 4 L on discharge home.  Patient called EMS today due to shortness of breath, at that time she was not on any nasal cannula O2 and her O2 sat was 52% on room air.  She was placed on nonrebreather mask and remained hypoxic around 85%.  History is limited due to patient's dementia.  She does admit to productive cough.  ED Course: Chest x-ray revealed stable interstitial lung disease.  Patient was given IV Lasix as well as IV Solu-Medrol.  She remained on high flow nasal cannula in the emergency department.  Review of Systems: As per HPI otherwise 10 point review of systems negative.   Past Medical History:  Diagnosis Date   Allergic rhinitis    Anxiety    Breast cancer ([place]) 2016   left breast   DDD (degenerative disc disease)    Dementia (HCC)    Dyslipidemia    GERD (gastroesophageal reflux disease)    Hypertension    Leukemia ([place]) 2010   Osteoporosis    Personal history of chemotherapy 2016   Personal history of radiation therapy 2016   Radiation 04/30/15-05/1515   Left breast   Vitamin D deficiency     Past Surgical History:  Procedure Laterality Date   BACK SURGERY  1984   BREAST LUMPECTOMY Left 2016   BREAST LUMPECTOMY WITH RADIOACTIVE SEED AND SENTINEL LYMPH NODE BIOPSY Left 02/27/2015   Procedure: LEFT BREAST LUMPECTOMY WITH RADIOACTIVE SEED AND SENTINEL LYMPH NODE BIOPSY;   Surgeon: Autumn Messing III, MD;  Location: [facility];  Service: General;  Laterality: Left;     reports that she has never smoked. She has never used smokeless tobacco. She reports that she does not drink alcohol or use drugs.  Allergies  Allergen Reactions   Ace Inhibitors Cough   Hyzaar [Losartan Potassium-Hctz] Cough   Lipitor [Atorvastatin]     Muscle fatigue   Losartan Cough   Tiazac [Diltiazem Hcl Er Beads] Cough    Family History  Problem Relation Age of Onset   Cancer Sister    Heart disease Brother      Prior to Admission medications   Medication Sig Start Date End Date Taking? Authorizing Provider  ALPRAZolam Duanne Moron) 0.5 MG tablet Take 1 tablet (0.5 mg total) by mouth 2 (two) times daily as needed for anxiety. Prescription needed for skilled nursing 11/08/18   Donne Hazel, MD  amLODipine (NORVASC) 5 MG tablet Take 5 mg by mouth daily. 10/25/18   [provider]  aspirin 81 MG tablet Take 81 mg by mouth daily.    [provider]  benzonatate (TESSALON) 200 MG capsule Take 1 capsule (200 mg total) by mouth 3 (three) times daily. 11/08/18   Donne Hazel, MD  carvedilol (COREG) 25 MG tablet Take 1 tablet (25 mg total) by mouth 2 (two) times daily. 10/19/17   Magrinat, [name], MD  donepezil (ARICEPT) 5  MG tablet Take 5 mg by mouth at bedtime.    [provider]  fluticasone (FLONASE) 50 MCG/ACT nasal spray Place 2 sprays into both nostrils daily. 09/10/18   [provider]  guaiFENesin (ROBITUSSIN) 100 MG/5ML liquid Take 200 mg by mouth every 6 (six) hours as needed for cough.    [provider]  latanoprost (XALATAN) 0.005 % ophthalmic solution Place 1 drop into both eyes at bedtime. 09/10/18   [provider]  mirtazapine (REMERON) 15 MG tablet Take 15 mg by mouth at bedtime.  07/13/17   [provider]  omeprazole (PRILOSEC) 20 MG capsule TAKE 1 CAPSULE BY MOUTH TWICE A DAY 10/25/18    Magrinat, [name], MD  polyethylene glycol (MIRALAX / GLYCOLAX) packet Take 17 g by mouth daily.    [provider]  Vitamin D, Ergocalciferol, (DRISDOL) 50000 units CAPS capsule Take 50,000 Units by mouth every 30 (thirty) days.    [provider]    Physical Exam: Vitals:   12/20/18 1245 12/20/18 1300 12/20/18 1337 12/20/18 1338  BP: 130/80 (!) 142/82 (!) 142/82   Pulse: 78 77 77   Resp: (!) 25 (!) 22 (!) 22   Temp:  99.3 F (37.4 C) 99.3 F (37.4 C)   TempSrc:  Oral Oral   SpO2: 97% 99% 99% 97%  Weight:      Height:         Constitutional: NAD, calm, comfortable Eyes: [name], lids and conjunctivae normal ENMT: Mucous membranes are moist. Posterior pharynx clear of any exudate or lesions.Normal dentition.  Neck: normal, supple, no masses, no thyromegaly Respiratory: Diffuse crackles bilaterally, no respiratory distress, on high flow nasal cannula O2 at 10 L Cardiovascular: Regular rate and rhythm, no murmurs / rubs / gallops. No extremity edema.  Abdomen: no tenderness, no masses palpated. No hepatosplenomegaly. Bowel sounds positive.  Musculoskeletal: no clubbing / cyanosis. No joint deformity upper and lower extremities. Normal muscle tone.  Skin: no rashes, lesions, ulcers on exposed skin Neurologic: No focal deficits Psychiatric: Dementia  Labs on Admission: I have personally reviewed following labs and imaging studies  CBC: Recent Labs  Lab 12/20/18 0953 12/20/18 1017  WBC 5.7  --   NEUTROABS 4.5  --   HGB 10.6* 11.9*  HCT 34.4* 35.0*  MCV 92.0  --   PLT 306  --    Basic Metabolic Panel: Recent Labs  Lab 12/20/18 0953 12/20/18 1017  NA 132* 133*  K 4.2 4.2  CL 92*  --   CO2 30  --   GLUCOSE 132*  --   BUN 18  --   CREATININE 0.84  --   CALCIUM 9.9  --    GFR: Estimated Creatinine Clearance: 27.3 mL/min (by C-G formula based on SCr of 0.84 mg/dL). Liver Function Tests: Recent Labs  Lab 12/20/18 0953  AST 46*  ALT 39  ALKPHOS  82  BILITOT 0.5  PROT 6.8  ALBUMIN 3.5   No results for input(s): LIPASE, AMYLASE in the last 168 hours. No results for input(s): AMMONIA in the last 168 hours. Coagulation Profile: No results for input(s): INR, PROTIME in the last 168 hours. Cardiac Enzymes: Recent Labs  Lab 12/20/18 0953  TROPONINI 0.52*   BNP (last 3 results) No results for input(s): PROBNP in the last 8760 hours. HbA1C: No results for input(s): HGBA1C in the last 72 hours. CBG: No results for input(s): GLUCAP in the last 168 hours. Lipid Profile: No results for input(s): CHOL, HDL,  LDLCALC, TRIG, CHOLHDL, LDLDIRECT in the last 72 hours. Thyroid Function Tests: No results for input(s): TSH, T4TOTAL, FREET4, T3FREE, THYROIDAB in the last 72 hours. Anemia Panel: No results for input(s): VITAMINB12, FOLATE, FERRITIN, TIBC, IRON, RETICCTPCT in the last 72 hours. Urine analysis:    Component Value Date/Time   COLORURINE YELLOW 11/27/2018 1726   APPEARANCEUR CLEAR 11/27/2018 1726   LABSPEC 1.012 11/27/2018 1726   LABSPEC 1.010 10/21/2015 1135   PHURINE 7.0 11/27/2018 1726   GLUCOSEU NEGATIVE 11/27/2018 1726   GLUCOSEU Negative 10/21/2015 1135   HGBUR NEGATIVE 11/27/2018 1726   BILIRUBINUR NEGATIVE 11/27/2018 1726   BILIRUBINUR Negative 10/21/2015 1135   KETONESUR NEGATIVE 11/27/2018 1726   PROTEINUR NEGATIVE 11/27/2018 1726   UROBILINOGEN 0.2 10/21/2015 1135   NITRITE NEGATIVE 11/27/2018 1726   LEUKOCYTESUR NEGATIVE 11/27/2018 1726   LEUKOCYTESUR Negative 10/21/2015 1135   Sepsis Labs: !!!!!!!!!!!!!!!!!!!!!!!!!!!!!!!!!!!!!!!!!!!! _0 (procalcitonin:4,lacticidven:4) )No results found for this or any previous visit (from the past 240 hour(s)).   Radiological Exams on Admission: Dg Chest Port 1 View  Result Date: 12/20/2018 CLINICAL DATA:  Shortness of breath, cough EXAM: PORTABLE CHEST 1 VIEW COMPARISON:  12/07/2018 FINDINGS: Cardiomegaly. Diffuse peripheral interstitial opacities are again noted  compatible with advanced interstitial lung disease, stable. No acute confluent airspace opacities or effusions. No acute bony abnormality. IMPRESSION: Advanced chronic interstitial lung disease/fibrosis, stable. Cardiomegaly. No definite acute process. Electronically Signed   By: Rolm Baptise M.D.   On: 12/20/2018 09:51    EKG: Independently reviewed.  Normal sinus rhythm with Q waves inversion in inferior lateral leads which are not new.  Assessment/Plan Principal Problem:   Acute on chronic respiratory failure with hypoxia (HCC) Active Problems:   Hypertension   ILD (interstitial lung disease) (HCC)   IPF (idiopathic pulmonary fibrosis) (HCC)   GERD (gastroesophageal reflux disease)   Dementia without behavioral disturbance (HCC)  Acute on chronic hypoxemic respiratory failure  -New baseline 4L [place] O2 since discharge from hospital March 2020 -Has required HFNC in the ED -CXR showed advanced chronic interstitial lung disease/fibrosis, stable. No over pulmonary edema, no peripheral edema despite BNP 1281. No fever at home per daughter, she has been checking twice daily. +Cough -HIGH RISK COVID-19 TEST, RSV ordered   Elevated troponin -Demand ischemia likely. Trend troponin.  -No complaints of chest pain   HTN -Continue coreg, norvasc   Dementia -At baseline, gets confused and repeats questions sometimes, but typically is oriented  -Continue aricept, remeron   GERD -PPI   PPE worn by physician during exam: N95, gown, gloves, face shield, shoe cover and hair bouffant.   DVT prophylaxis: Lovenox  Code Status: DNR, confirmed with daughter Family Communication: Spoke with daughter over the phone  Disposition Plan: Pending COVID test and O2 requirements  Consults called: None  Admission status: inpatient   Severity of Illness: The appropriate patient status for this patient is INPATIENT. Inpatient status is judged to be reasonable and necessary in order to provide the required  intensity of service to ensure the patient's safety. The patient's presenting symptoms, physical exam findings, and initial radiographic and laboratory data in the context of their chronic comorbidities is felt to place them at high risk for further clinical deterioration. Furthermore, it is not anticipated that the patient will be medically stable for discharge from the hospital within 2 midnights of admission. The following factors support the patient status of inpatient.   " The patient's presenting symptoms include shortness of breath and worsening O2 requirement. " The worrisome physical  exam findings include diffuse crackles. " The chronic co-morbidities include chronic hypoxemic respiratory failure, pulmonary fibrosis, dementia.   * I certify that at the point of admission it is my clinical judgment that the patient will require inpatient hospital care spanning beyond 2 midnights from the point of admission due to high intensity of service, high risk for further deterioration and high frequency of surveillance required.Dessa Phi, DO [facility] 12/20/2018, 2:18 PM    How to contact the [facility] Attending or Consulting provider [address] or covering provider during after hours [place], for this patient?  1. Check the care team in [facility] and look for a) attending/consulting TR[facility] provider listed and b) the [facility] team listed 2. Log into [email or web address] and use [place]'s universal password to access. If you do not have the password, please contact the hospital operator. 3. Locate the [facility] provider you are looking for under [facility] and page to a number that you can be directly reached. 4. If you still have difficulty reaching the provider, please page the [facility] (Director on Call) for the Hospitalists listed on amion for assistance.

## 2018-12-20 NOTE — ED Notes (Signed)
ED TO INPATIENT HANDOFF REPORT  ED Nurse Name and Phone #: Hulan Amato, RN at [phone]  S Name/Age/Gender Pamela House 85 y.o. female Room/Bed: 033C/033C  Code Status   Code Status: DNR  Home/SNF/Other Home Patient oriented to: self Is this baseline? Yes   Triage Complete: Triage complete  Chief Complaint sob,cough  Triage Note Pt from home via EMS presents with hypoxia.  Pt has known pulmonary fibrosis and is on home O2 with home health/possibly hospice.  Upon EMS arrival pt did not have her O2 tank plugged in or turned on and her initial O2 sat on RA was 52%.  Pt was placed on 15lpm NRB and O2 sats are currently 100% upon arrival to ED. Pt is confused and cannot recall events of this morning or her recent admission to the hospital.   Allergies Allergies  Allergen Reactions  . Ace Inhibitors Cough  . Hyzaar [Losartan Potassium-Hctz] Cough  . Lipitor [Atorvastatin]     Muscle fatigue  . Losartan Cough  . Tiazac [Diltiazem Hcl Er Beads] Cough    Level of Care/Admitting Diagnosis ED Disposition    ED Disposition Condition Comment   Admit  Hospital Area: [facility] [[id number]]  Level of Care: Med-Surg [16]  Diagnosis: Acute hypoxemic respiratory failure [facility]) [[id number]]  Admitting Physician: Dessa Phi [[id number]]  Attending Physician: Dessa Phi [id number]  Estimated length of stay: 3 - 4 days  Certification:: I certify this patient will need inpatient services for at least 2 midnights  PT Class (Do Not Modify): Inpatient [101]  PT Acc Code (Do Not Modify): Private [1]       B Medical/Surgery History Past Medical History:  Diagnosis Date  . Allergic rhinitis   . Anxiety   . Breast cancer ([place]) 2016   left breast  . DDD (degenerative disc disease)   . Dementia ([place])   . Dyslipidemia   . GERD (gastroesophageal reflux disease)   . Hypertension   . Leukemia ([place]) 2010  . Osteoporosis   . Personal history of chemotherapy 2016  . Personal  history of radiation therapy 2016  . Radiation 04/30/15-05/1515   Left breast  . Vitamin D deficiency    Past Surgical History:  Procedure Laterality Date  . BACK SURGERY  1984  . BREAST LUMPECTOMY Left 2016  . BREAST LUMPECTOMY WITH RADIOACTIVE SEED AND SENTINEL LYMPH NODE BIOPSY Left 02/27/2015   Procedure: LEFT BREAST LUMPECTOMY WITH RADIOACTIVE SEED AND SENTINEL LYMPH NODE BIOPSY;  Surgeon: Autumn Messing III, MD;  Location: [facility];  Service: General;  Laterality: Left;     A IV Location/Drains/Wounds Patient Lines/Drains/Airways Status   Active Line/Drains/Airways    Name:   Placement date:   Placement time:   Site:   Days:   External Urinary Catheter   11/02/18    1617    -   48          Intake/Output Last 24 hours No intake or output data in the 24 hours ending 12/20/18 1238  Labs/Imaging Results for orders placed or performed during the hospital encounter of 12/20/18 (from the past 48 hour(s))  CBC with Differential/Platelet     Status: Abnormal   Collection Time: 12/20/18  9:53 AM  Result Value Ref Range   WBC 5.7 4.0 - 10.5 K/uL   RBC 3.74 (L) 3.87 - 5.11 MIL/uL   Hemoglobin 10.6 (L) 12.0 - 15.0 g/dL   HCT 34.4 (L) 36.0 - 46.0 %   MCV 92.0  80.0 - 100.0 fL   MCH 28.3 26.0 - 34.0 pg   MCHC 30.8 30.0 - 36.0 g/dL   RDW 13.6 11.5 - 15.5 %   Platelets 306 150 - 400 K/uL   nRBC 0.0 0.0 - 0.2 %   Neutrophils Relative % 79 %   Neutro Abs 4.5 1.7 - 7.7 K/uL   Lymphocytes Relative 12 %   Lymphs Abs 0.7 0.7 - 4.0 K/uL   Monocytes Relative 7 %   Monocytes Absolute 0.4 0.1 - 1.0 K/uL   Eosinophils Relative 1 %   Eosinophils Absolute 0.1 0.0 - 0.5 K/uL   Basophils Relative 1 %   Basophils Absolute 0.0 0.0 - 0.1 K/uL   Immature Granulocytes 0 %   Abs Immature Granulocytes 0.02 0.00 - 0.07 K/uL    Comment: Performed at [facility] [address], [place], [place] [address]  Comprehensive metabolic panel     Status: Abnormal   Collection Time:  12/20/18  9:53 AM  Result Value Ref Range   Sodium 132 (L) 135 - 145 mmol/L   Potassium 4.2 3.5 - 5.1 mmol/L   Chloride 92 (L) 98 - 111 mmol/L   CO2 30 22 - 32 mmol/L   Glucose, Bld 132 (H) 70 - 99 mg/dL   BUN 18 8 - 23 mg/dL   Creatinine, Ser 0.84 0.44 - 1.00 mg/dL   Calcium 9.9 8.9 - 10.3 mg/dL   Total Protein 6.8 6.5 - 8.1 g/dL   Albumin 3.5 3.5 - 5.0 g/dL   AST 46 (H) 15 - 41 U/L   ALT 39 0 - 44 U/L   Alkaline Phosphatase 82 38 - 126 U/L   Total Bilirubin 0.5 0.3 - 1.2 mg/dL   GFR calc non Af Amer >60 >60 mL/min   GFR calc Af Amer >60 >60 mL/min   Anion gap 10 5 - 15    Comment: Performed at [facility], [place] [address], [place], [place] [address]  Troponin I - ONCE - STAT     Status: Abnormal   Collection Time: 12/20/18  9:53 AM  Result Value Ref Range   Troponin I 0.52 (HH) <0.03 ng/mL    Comment: CRITICAL RESULT CALLED TO, READ BACK BY AND VERIFIED WITHLuciano Cutter [id number] WILDERK Performed at [facility], [place] [address], [place], [place] [address]   Brain natriuretic peptide     Status: Abnormal   Collection Time: 12/20/18  9:53 AM  Result Value Ref Range   B Natriuretic Peptide 1,281.7 (H) 0.0 - 100.0 pg/mL    Comment: Performed at [facility] [address], [place], [place] [address]  POCT I-Stat EG7     Status: Abnormal   Collection Time: 12/20/18 10:17 AM  Result Value Ref Range   pH, Ven 7.356 7.250 - 7.430   pCO2, Ven 63.1 (H) 44.0 - 60.0 mmHg   pO2, Ven 59.0 (H) 32.0 - 45.0 mmHg   Bicarbonate 35.3 (H) 20.0 - 28.0 mmol/L   TCO2 37 (H) 22 - 32 mmol/L   O2 Saturation 88.0 %   Acid-Base Excess 8.0 (H) 0.0 - 2.0 mmol/L   Sodium 133 (L) 135 - 145 mmol/L   Potassium 4.2 3.5 - 5.1 mmol/L   Calcium, Ion 1.28 1.15 - 1.40 mmol/L   HCT 35.0 (L) 36.0 - 46.0 %   Hemoglobin 11.9 (L) 12.0 - 15.0 g/dL   Patient temperature HIDE    Sample type VENOUS  Dg Chest Port 1 View  Result Date: 12/20/2018 CLINICAL DATA:  Shortness of breath,  cough EXAM: PORTABLE CHEST 1 VIEW COMPARISON:  12/07/2018 FINDINGS: Cardiomegaly. Diffuse peripheral interstitial opacities are again noted compatible with advanced interstitial lung disease, stable. No acute confluent airspace opacities or effusions. No acute bony abnormality. IMPRESSION: Advanced chronic interstitial lung disease/fibrosis, stable. Cardiomegaly. No definite acute process. Electronically Signed   By: Rolm Baptise M.D.   On: 12/20/2018 09:51    Pending Labs Unresulted Labs (From admission, onward)    Start     Ordered   12/20/18 1140  Respiratory Panel by PCR  (Respiratory virus panel with precautions)  Once,   R    Question:  Patient immune status  Answer:  Normal   12/20/18 1139   12/20/18 1140  Novel Coronavirus, NAA (hospital order; send-out to ref lab)  (Novel Coronavirus, NAA [facility] Order; send-out to ref lab) with precautions panel)  Once,   R    Comments:  SOB, worse hypoxia   Question Answer Comment  Current symptoms Other (testing not indicated)   Patient immune status Normal      12/20/18 1139          Vitals/Pain Today's Vitals   12/20/18 1115 12/20/18 1130 12/20/18 1145 12/20/18 1230  BP: 136/71 130/76 126/72 (!) 162/86  Pulse: 66 66 69 73  Resp: _0 Temp:      TempSrc:      SpO2: 100% 100% 100% 100%  Weight:      Height:      PainSc:        Isolation Precautions Airborne and Contact precautions  Medications Medications  methylPREDNISolone sodium succinate (SOLU-MEDROL) 125 mg/2 mL injection 125 mg (125 mg Intravenous Given 12/20/18 1001)  furosemide (LASIX) injection 40 mg (40 mg Intravenous Given 12/20/18 1152)  aspirin tablet 325 mg (325 mg Oral Given 12/20/18 1152)    Mobility walks with device High fall risk   Focused Assessments Pulmonary Assessment Handoff:  Lung sounds: L Breath Sounds: Diminished R Breath Sounds: Diminished O2 Device: High Flow Nasal Cannula O2 Flow Rate (L/min): 10  L/min      R Recommendations: See Admitting Provider Note  Report given to:   Additional Notes: Updated pt's daughter via telephone.  Test for COVID-19 obtained and send for analysis

## 2018-12-20 NOTE — ED Triage Notes (Signed)
Pt from home via EMS presents with hypoxia.  Pt has known pulmonary fibrosis and is on home O2 with home health/possibly hospice.  Upon EMS arrival pt did not have her O2 tank plugged in or turned on and her initial O2 sat on RA was 52%.  Pt was placed on 15lpm NRB and O2 sats are currently 100% upon arrival to ED. Pt is confused and cannot recall events of this morning or her recent admission to the hospital.

## 2018-12-20 NOTE — ED Notes (Signed)
Pt's O2 turned down to 8LPM HFNC at this time.

## 2018-12-20 NOTE — Progress Notes (Signed)
[facility] ([facility])  Pt started with hospice services at home yesterday.  Family elected to call EMS due to SOB.  Hospice was not notified prior to them activating EMS.  [facility] will continue to follow.  Venia Carbon RN, BSN, [place] [facility] Liaison (in [place] under Hospice and [place] [facility] [place]) [phone]

## 2018-12-20 NOTE — Progress Notes (Cosign Needed Addendum)
10:46am-CSW spoke with MD Darl Householder and was advised that he would be admitting pt at this time. CSW updated Jenniferwith [facility] as well as pt's daughter [place] Carbo.   CSw spoke with Anderson Malta from [facility]. CSW was advised that per Anderson Malta pt's daughter went to grab breakfast this morning and pt called EMS herself. CSW was advised that Anderson Malta would call pt's family to see what the pt wanted to do in regards to continuing being on hospice. CSW awaits call back from [place] at this time.     [name] Lakin Rhine, MSW, [place] Emergency Department Clinical Social Worker [phone]

## 2018-12-20 NOTE — ED Notes (Signed)
Purewick device place to collect pt urine

## 2018-12-21 DIAGNOSIS — J84112 Idiopathic pulmonary fibrosis: Secondary | ICD-10-CM

## 2018-12-21 DIAGNOSIS — K219 Gastro-esophageal reflux disease without esophagitis: Secondary | ICD-10-CM

## 2018-12-21 DIAGNOSIS — F0390 Unspecified dementia without behavioral disturbance: Secondary | ICD-10-CM

## 2018-12-21 DIAGNOSIS — I159 Secondary hypertension, unspecified: Secondary | ICD-10-CM

## 2018-12-21 DIAGNOSIS — J849 Interstitial pulmonary disease, unspecified: Secondary | ICD-10-CM

## 2018-12-21 DIAGNOSIS — J9621 Acute and chronic respiratory failure with hypoxia: Principal | ICD-10-CM

## 2018-12-21 LAB — COMPREHENSIVE METABOLIC PANEL
ALT: 29 U/L (ref 0–44)
AST: 30 U/L (ref 15–41)
Albumin: 3.1 g/dL — ABNORMAL LOW (ref 3.5–5.0)
Alkaline Phosphatase: 76 U/L (ref 38–126)
Anion gap: 10 (ref 5–15)
BUN: 26 mg/dL — ABNORMAL HIGH (ref 8–23)
CO2: 32 mmol/L (ref 22–32)
Calcium: 9.7 mg/dL (ref 8.9–10.3)
Chloride: 92 mmol/L — ABNORMAL LOW (ref 98–111)
Creatinine, Ser: 0.99 mg/dL (ref 0.44–1.00)
GFR calc Af Amer: 60 mL/min — ABNORMAL LOW (ref >60)
GFR calc non Af Amer: 52 mL/min — ABNORMAL LOW (ref >60)
Glucose, Bld: 108 mg/dL — ABNORMAL HIGH (ref 70–99)
Potassium: 4.5 mmol/L (ref 3.5–5.1)
Sodium: 134 mmol/L — ABNORMAL LOW (ref 135–145)
Total Bilirubin: 0.4 mg/dL (ref 0.3–1.2)
Total Protein: 6.4 g/dL — ABNORMAL LOW (ref 6.5–8.1)

## 2018-12-21 LAB — CBC WITH DIFFERENTIAL/PLATELET
Abs Immature Granulocytes: 0.01 10*3/uL (ref 0.00–0.07)
Basophils Absolute: 0 10*3/uL (ref 0.0–0.1)
Basophils Relative: 0 %
Eosinophils Absolute: 0 10*3/uL (ref 0.0–0.5)
Eosinophils Relative: 0 %
HCT: 33.6 % — ABNORMAL LOW (ref 36.0–46.0)
Hemoglobin: 10.8 g/dL — ABNORMAL LOW (ref 12.0–15.0)
Immature Granulocytes: 0 %
Lymphocytes Relative: 13 %
Lymphs Abs: 0.7 10*3/uL (ref 0.7–4.0)
MCH: 29 pg (ref 26.0–34.0)
MCHC: 32.1 g/dL (ref 30.0–36.0)
MCV: 90.1 fL (ref 80.0–100.0)
Monocytes Absolute: 0.3 10*3/uL (ref 0.1–1.0)
Monocytes Relative: 7 %
Neutro Abs: 4 10*3/uL (ref 1.7–7.7)
Neutrophils Relative %: 80 %
Platelets: 317 10*3/uL (ref 150–400)
RBC: 3.73 MIL/uL — ABNORMAL LOW (ref 3.87–5.11)
RDW: 13.6 % (ref 11.5–15.5)
WBC: 5 10*3/uL (ref 4.0–10.5)
nRBC: 0 % (ref 0.0–0.2)

## 2018-12-21 LAB — TROPONIN I
Troponin I: 0.58 ng/mL (ref <0.03)
Troponin I: 0.6 ng/mL (ref <0.03)
Troponin I: 0.65 ng/mL (ref <0.03)

## 2018-12-21 MED ORDER — ADULT MULTIVITAMIN W/MINERALS CH
1.0000 | ORAL_TABLET | Freq: Every day | ORAL | Status: DC
  Administered 2018-12-21 – 2018-12-23 (×3): 1 via ORAL
  Filled 2018-12-21 (×3): qty 1

## 2018-12-21 MED ORDER — ENSURE ENLIVE PO LIQD
237.0000 mL | Freq: Three times a day (TID) | ORAL | Status: DC
  Administered 2018-12-21 – 2018-12-23 (×7): 237 mL via ORAL

## 2018-12-21 MED ORDER — FUROSEMIDE 10 MG/ML IJ SOLN
40.0000 mg | Freq: Once | INTRAMUSCULAR | Status: AC
  Administered 2018-12-21: 40 mg via INTRAVENOUS
  Filled 2018-12-21: qty 4

## 2018-12-21 NOTE — Progress Notes (Addendum)
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 Borjon is under hospice services with [place], diagnosis of COPD per Dr. Lyman Speller.  Ms. Sulser was left alone this am while her daughter ran an errand.  While she was gone, pt removed her O2 and became SOB, activated EMS and was transported prior to her daughter or hospice being made aware.  She is admitted with SOB, increasing O2 requirements, acute on chronic respiratory failure.  Patient's daughter [place] states patient lives at [facility]. Daughter provides care. She states her mother was probably anxious and called EMS.  No complaints from pt other than SOB.  Updated report from RN. Patient is stable and on less oxygen today.   V/S:  98.6 oral, 132./74, 63, RR 20, SPO2 95% @ 3 lpm I&O: 360/825 Lab work:  RBC 3.73, Hgb 10.8, HCT 33.6 novel coronavirus collected but not yet resulted, [id number], troponin 0.52  Per MD notes:Principal Problem:   Acute on chronic respiratory failure with hypoxia (HCC) Active Problems:   Hypertension   ILD (interstitial lung disease) ([place])   IPF (idiopathic pulmonary fibrosis) (HCC)   GERD (gastroesophageal reflux disease)   Dementia without behavioral disturbance ([place]) --Acute on chronic hypoxemic respiratory failure  -New baseline 4L [place] O2 since discharge from hospital March 2020 -Has required HFNC in the ED -CXR showed advanced chronic interstitial lung disease/fibrosis, stable. No over pulmonary edema, no peripheral edema despite BNP 1281. No fever at home per daughter, she has been checking twice daily. +Cough -HIGH RISK COVID-19 TEST, RSV ordered   IDT:  Updated.    GOC:  D/c home with hospice support once medically ready.  Her daughter did not necessarily want her sent to hospital, but pt activated EMS herself.  PCG:  Updated Pearl by phone. Pearl states she would like for patient to be discharged home as soon as possible. Had she been with her, she would not have sent her to  the hospital.  Due to hospital restrictions related to covid 19, unable to visit patient.    Transfer summary and medication list faxed to unit to be placed on shadow chart.  Once pt ready for discharge, if EMS transportation is needed, please use [facility] as they contract this service for [facility].  At this time her daughter states she can bring her home by private vehicle.   Thank you, Farrel Gordon, RN, CCM [place] (in [place] under Hospice and [place]) [phone] (main #)

## 2018-12-21 NOTE — Progress Notes (Signed)
Initial Nutrition Assessment  RD working remotely.  DOCUMENTATION CODES:   Severe malnutrition in context of chronic illness, Underweight  INTERVENTION:   - Ensure Enlive po TID, each supplement provides 350 kcal and 20 grams of protein  - MVI with minerals daily  NUTRITION DIAGNOSIS:   Severe Malnutrition related to chronic illness (dementia, CML) as evidenced by energy intake < or equal to 75% for > or equal to 1 month, percent weight loss (18.2% weight loss in less than 6 months).  GOAL:   Patient will meet greater than or equal to 90% of their needs  MONITOR:   PO intake, Supplement acceptance, Labs, Weight trends  REASON FOR ASSESSMENT:   Malnutrition Screening Tool, Other (underweight BMI)    ASSESSMENT:   85 year old female with PMH of interstitial fibrosis of the lung, chronic respiratory failure, dementia, breast cancer, GERD, hypertension, CML, GERD. Pt presented to the ED on 4/08 with hypoxia. Pt just went on hospice on 4/07. Pt had called EMS stating she had SOB after removing her oxygen. Pt admitted with acute on chronic respiratory failure.  Spoke with pt via phone call to room. History obtained from pt was limited due to confusion and forgetfulness.  Upon answering the phone, pt states that she is trying to eat breakfast and feels "about the same." Pt reports, "I'm just trying to do what I need to do."  Pt reports that "sometimes" her appetite is poor and "sometimes no" and that it "just depends." Pt states, "some days I eat more and some days I eat less." Pt reports that this has been going on for a while.  When asked how many meals pt eats during the day, pt unable to provide specifics. Pt able to name that she enjoys eating fruit but unable to provide more details. When asked about Ensure shakes, pt states, "I love them."  Pt endorses losing "a lot of weight" but is unsure of the timeframe and states it started "last year or the year before last due to my  appetite." Pt reports her UBW as 170-180 lbs. Reviewed weight history in chart. Pt with 8 kg weight loss since 06/26/18. This is an 18.2% weight loss in less than 6 months which is severe and significant for timeframe.  Pt denies any issues chewing or swallowing. Pt states that sometimes she experiences nausea but not recently. RD encouraged adequate PO intake at meal times and encouraged pt to consume oral nutrition supplements. RD will increase Ensure Enlive order to TID.  Pt with previous diagnosis of severe malnutrition. Given significant weight loss and limited diet history obtained from pt, it is likely that pt consistently meets less than 75% of her estimated nutritional needs at home for at least the past month consistently and that malnutrition persists. Unable to complete NFPE at this time.  Medications reviewed and include: Ensure Enlive BID, Remeron 15 mg daily, Protonix  Labs reviewed: sodium 134 (L), BUN 26 (H)  NUTRITION - FOCUSED PHYSICAL EXAM:  Unable to complete at this time. RD working remotely.  Diet Order:   Diet Order            Diet regular Room service appropriate? Yes; Fluid consistency: Thin  Diet effective now              EDUCATION NEEDS:   Education needs have been addressed  Skin:  Skin Assessment: Reviewed RN Assessment  Last BM:  PTA/unknown  Height:   Ht Readings from Last 1 Encounters:  12/20/18  5' 3" (1.6 m)    Weight:   Wt Readings from Last 1 Encounters:  12/20/18 36 kg    Ideal Body Weight:  52.3 kg  BMI:  Body mass index is 14.06 kg/m.  Estimated Nutritional Needs:   Kcal:  1150-1350  Protein:  45-60 grams  Fluid:  >/= 1.2 L    Gaynell Face, MS, RD, LDN Inpatient Clinical Dietitian Pager: [phone] Weekend/After Hours: [phone]

## 2018-12-21 NOTE — Progress Notes (Addendum)
PROGRESS NOTE    Pamela House  PVV:[id number] DOB: October 08, 1929 DOA: 12/20/2018 PCP: Wenda Low, MD   Brief Narrative:  HPI per Dr. Dessa Phi Pamela House is a 85 y.o. female with medical history significant of pulmonary fibrosis, chronic hypoxemic respiratory failure on 2-4 L nasal cannula O2, dementia. She was recently discharged from the hospital on 12/09/2018 after she was admitted for shortness of breath.  At the time of admission, she had baseline 2 L nasal cannula O2 requirement which was bumped up to 4 L on discharge home.  Patient called EMS today due to shortness of breath, at that time she was not on any nasal cannula O2 and her O2 sat was 52% on room air.  She was placed on nonrebreather mask and remained hypoxic around 85%.  History is limited due to patient's dementia.  She does admit to productive cough.  **Interim History Oxygen requirement is weaning but patient still feels short of breath.  Given another dose of IV Furosemide.  Assessment & Plan:   Principal Problem:   Acute on chronic respiratory failure with hypoxia (HCC) Active Problems:   Hypertension   ILD (interstitial lung disease) (HCC)   IPF (idiopathic pulmonary fibrosis) (HCC)   GERD (gastroesophageal reflux disease)   Dementia without behavioral disturbance (HCC)  Acute on chronic hypoxemic respiratory failure in the Setting of ILD/IPF with possible COVID Infection -New baseline 4L [place] O2 since discharge from hospital March 2020 -VBG showed 7.356/63.1/59.0/35.5/88.0% -Has required HFNC in the ED and had to be placed on 10-15 Liters HFNC and has now been weaned down to 3 liters -CXR showed advanced chronic interstitial lung disease/fibrosis, stable. No over pulmonary edema, no peripheral edema despite -BNP 1281.7 and was given IV Lasix yesterday and this will be repeated today. -No fever at home per daughter, she has been checking twice daily.  -Patient has A Cough -HIGH RISK COVID-19 TEST, RSV and  Negative -D-Dimer was Elevated at 3.16 but recently had A CTA of the Chest on 12/07/2018 which showed No evidence of acute pulmonary embolus. Chronic pulmonary artery enlargement compatible with a degree of pulmonary artery hypertension. -Continue to Monitor Respiratory status and will consider repeating CTA of the Chest and obtaining LE Dopplers -Will need to await Novel Corona Virus Test result prior to D/C'ing back to Facility -Repeat CXR in AM   Elevated Troponin -Demand ischemia likely.  -Trending Troponin and relatively Flat as went from 0.52 -> 0.53 -> 0.58 -> 0.60.  -No complaints of chest pain  -Continue to Monitor and evaluate for CP  HTN -Continue Carvedilol 25 mg po BID and Amlodipine 5 mg po Daily   Dementia -At baseline, gets confused and repeats questions sometimes, but typically is oriented  -Continue Donepizil 5 mg po qHS and Mirtazapine 15 mg po Daily   GERD -C/w Pantoprazole 40 mg po Daily  Severe Malnutrition in the context of chronic Illness/Underweight -Patient is consulted for further evaluation recommendations -Continue Ensure Enlive p.o. 3 times daily as well as a multivitamin with minerals daily -C/w Mirtazapine 15 mg po Daily   Hyponatremia -Mild at 134 -Continue to Monitor and repeat CMP in AM   Hyperglycemia -Patient's Blood Sugar has been ranging from 108-132 -Continue to Monitor and if blood sugars remain consistently elevated will place on Sensitive Novolog SSI AC  DVT prophylaxis: Enoxaparin 40 mg sq q24h Code Status: DO NOT RESUSCITATE Family Communication: No family  Disposition Plan: Home with Home Hospice back to [facility]  Living if SARS-CoV-2 Virus is Negative   Consultants:   None   Procedures: None   Antimicrobials:  Anti-infectives (From admission, onward)   None     Subjective: Seen and examined at bedside and she is still feeling short of breath.  No nausea or vomiting.  Was wanting something to help her with  her appetite.  No other concerns or complaints at this time.  Objective: Vitals:   12/20/18 2023 12/20/18 2332 12/21/18 0449 12/21/18 0828  BP: 101/61 110/70 118/65 132/74  Pulse: 74 75 (!) 58 63  Resp: _0 Temp: 99.6 F (37.6 C) 99.3 F (37.4 C) 98.1 F (36.7 C) 98.6 F (37 C)  TempSrc: Oral Oral Oral Oral  SpO2: 93% 97% 100% 95%  Weight:      Height:        Intake/Output Summary (Last 24 hours) at 12/21/2018 1657 Last data filed at 12/21/2018 1400 Gross per 24 hour  Intake 1080 ml  Output 1100 ml  Net -20 ml   Filed Weights   12/20/18 0935  Weight: 36 kg   Examination: Physical Exam:  Constitutional: Thin elderly AAF in NAD and appears calm but slightly uncomfortable  Eyes: Lids and conjunctivae normal, sclerae anicteric  ENMT: External Ears, Nose appear normal. Grossly normal hearing.  Neck: Appears normal, supple, no cervical masses, normal ROM, no appreciable thyromegaly; no JVD Respiratory: Diminished to auscultation bilaterally, no wheezing, rales, rhonchi or crackles. Normal respiratory effort and patient is not tachypenic. No accessory muscle use.  Cardiovascular: RRR, no murmurs / rubs / gallops. S1 and S2 auscultated. No LE extremity edema. Abdomen: Soft, non-tender, non-distended. No masses palpated. No appreciable hepatosplenomegaly. Bowel sounds positive x4.  GU: Deferred. Musculoskeletal: No clubbing / cyanosis of digits/nails. No joint deformity upper and lower extremities. Skin: No rashes, lesions, ulcers on a limited skin evaluation. No induration; Warm and dry.  Neurologic: CN 2-12 grossly intact with no focal deficits. Romberg sign and cerebellar reflexes not assessed.  Psychiatric: Normal judgment and insight. Awake and alert. Normal mood and appropriate affect.   Data Reviewed: I have personally reviewed following labs and imaging studies  CBC: Recent Labs  Lab 12/20/18 0953 12/20/18 1017 12/20/18 1433 12/21/18 0339  WBC 5.7  --  5.5  5.0  NEUTROABS 4.5  --   --  4.0  HGB 10.6* 11.9* 11.4* 10.8*  HCT 34.4* 35.0* 36.1 33.6*  MCV 92.0  --  89.8 90.1  PLT 306  --  351 010   Basic Metabolic Panel: Recent Labs  Lab 12/20/18 0953 12/20/18 1017 12/20/18 1433 12/21/18 0339  NA 132* 133*  --  134*  K 4.2 4.2  --  4.5  CL 92*  --   --  92*  CO2 30  --   --  32  GLUCOSE 132*  --   --  108*  BUN 18  --   --  26*  CREATININE 0.84  --  0.92 0.99  CALCIUM 9.9  --   --  9.7   GFR: Estimated Creatinine Clearance: 23.2 mL/min (by C-G formula based on SCr of 0.99 mg/dL). Liver Function Tests: Recent Labs  Lab 12/20/18 0953 12/21/18 0339  AST 46* 30  ALT 39 29  ALKPHOS 82 76  BILITOT 0.5 0.4  PROT 6.8 6.4*  ALBUMIN 3.5 3.1*   No results for input(s): LIPASE, AMYLASE in the last 168 hours. No results for input(s): AMMONIA in the last 168 hours. Coagulation Profile: No  results for input(s): INR, PROTIME in the last 168 hours. Cardiac Enzymes: Recent Labs  Lab 12/20/18 0953 12/20/18 1433 12/21/18 0946 12/21/18 1428  TROPONINI 0.52* 0.53* 0.58* 0.60*   BNP (last 3 results) No results for input(s): PROBNP in the last 8760 hours. HbA1C: No results for input(s): HGBA1C in the last 72 hours. CBG: No results for input(s): GLUCAP in the last 168 hours. Lipid Profile: No results for input(s): CHOL, HDL, LDLCALC, TRIG, CHOLHDL, LDLDIRECT in the last 72 hours. Thyroid Function Tests: No results for input(s): TSH, T4TOTAL, FREET4, T3FREE, THYROIDAB in the last 72 hours. Anemia Panel: Recent Labs    12/20/18 1433  FERRITIN 65   Sepsis Labs: Recent Labs  Lab 12/20/18 1433  PROCALCITON <0.10    Recent Results (from the past 240 hour(s))  Respiratory Panel by PCR     Status: None   Collection Time: 12/20/18 11:40 AM  Result Value Ref Range Status   Adenovirus NOT DETECTED NOT DETECTED Final   Coronavirus 229E NOT DETECTED NOT DETECTED Final    Comment: (NOTE) The Coronavirus on the Respiratory Panel, DOES  NOT test for the novel  Coronavirus (2019 nCoV)    Coronavirus HKU1 NOT DETECTED NOT DETECTED Final   Coronavirus NL63 NOT DETECTED NOT DETECTED Final   Coronavirus OC43 NOT DETECTED NOT DETECTED Final   Metapneumovirus NOT DETECTED NOT DETECTED Final   Rhinovirus / Enterovirus NOT DETECTED NOT DETECTED Final   Influenza A NOT DETECTED NOT DETECTED Final   Influenza B NOT DETECTED NOT DETECTED Final   Parainfluenza Virus 1 NOT DETECTED NOT DETECTED Final   Parainfluenza Virus 2 NOT DETECTED NOT DETECTED Final   Parainfluenza Virus 3 NOT DETECTED NOT DETECTED Final   Parainfluenza Virus 4 NOT DETECTED NOT DETECTED Final   Respiratory Syncytial Virus NOT DETECTED NOT DETECTED Final   Bordetella pertussis NOT DETECTED NOT DETECTED Final   Chlamydophila pneumoniae NOT DETECTED NOT DETECTED Final   Mycoplasma pneumoniae NOT DETECTED NOT DETECTED Final    Comment: Performed at [facility], [place]. [address] [place], [place] [address]    Radiology Studies: Dg Chest [facility] 1 View  Result Date: 12/20/2018 CLINICAL DATA:  Shortness of breath, cough EXAM: PORTABLE CHEST 1 VIEW COMPARISON:  12/07/2018 FINDINGS: Cardiomegaly. Diffuse peripheral interstitial opacities are again noted compatible with advanced interstitial lung disease, stable. No acute confluent airspace opacities or effusions. No acute bony abnormality. IMPRESSION: Advanced chronic interstitial lung disease/fibrosis, stable. Cardiomegaly. No definite acute process. Electronically Signed   By: Rolm Baptise M.D.   On: 12/20/2018 09:51   Scheduled Meds: . amLODipine  5 mg Oral Daily  . aspirin  81 mg Oral Daily  . carvedilol  25 mg Oral BID  . donepezil  5 mg Oral QHS  . enoxaparin (LOVENOX) injection  40 mg Subcutaneous Q24H  . feeding supplement (ENSURE ENLIVE)  237 mL Oral TID BM  . mirtazapine  15 mg Oral QHS  . multivitamin with minerals  1 tablet Oral Daily  . pantoprazole  40 mg Oral Daily   Continuous Infusions:   LOS:  1 day   Kerney Elbe, DO [facility] PAGER is on [place]  If 7PM-7AM, please contact night-coverage [email or web address] Password [facility] 12/21/2018, 4:57 PM

## 2018-12-22 ENCOUNTER — Inpatient Hospital Stay (HOSPITAL_COMMUNITY)

## 2018-12-22 LAB — CBC WITH DIFFERENTIAL/PLATELET
Abs Immature Granulocytes: 0.01 10*3/uL (ref 0.00–0.07)
Basophils Absolute: 0.1 10*3/uL (ref 0.0–0.1)
Basophils Relative: 1 %
Eosinophils Absolute: 0.1 10*3/uL (ref 0.0–0.5)
Eosinophils Relative: 3 %
HCT: 33.2 % — ABNORMAL LOW (ref 36.0–46.0)
Hemoglobin: 10.1 g/dL — ABNORMAL LOW (ref 12.0–15.0)
Immature Granulocytes: 0 %
Lymphocytes Relative: 25 %
Lymphs Abs: 1.1 10*3/uL (ref 0.7–4.0)
MCH: 27.6 pg (ref 26.0–34.0)
MCHC: 30.4 g/dL (ref 30.0–36.0)
MCV: 90.7 fL (ref 80.0–100.0)
Monocytes Absolute: 0.6 10*3/uL (ref 0.1–1.0)
Monocytes Relative: 13 %
Neutro Abs: 2.6 10*3/uL (ref 1.7–7.7)
Neutrophils Relative %: 58 %
Platelets: 322 10*3/uL (ref 150–400)
RBC: 3.66 MIL/uL — ABNORMAL LOW (ref 3.87–5.11)
RDW: 13.5 % (ref 11.5–15.5)
WBC: 4.5 10*3/uL (ref 4.0–10.5)
nRBC: 0 % (ref 0.0–0.2)

## 2018-12-22 LAB — COMPREHENSIVE METABOLIC PANEL
ALT: 27 U/L (ref 0–44)
AST: 22 U/L (ref 15–41)
Albumin: 2.6 g/dL — ABNORMAL LOW (ref 3.5–5.0)
Alkaline Phosphatase: 61 U/L (ref 38–126)
Anion gap: 8 (ref 5–15)
BUN: 24 mg/dL — ABNORMAL HIGH (ref 8–23)
CO2: 39 mmol/L — ABNORMAL HIGH (ref 22–32)
Calcium: 9.5 mg/dL (ref 8.9–10.3)
Chloride: 90 mmol/L — ABNORMAL LOW (ref 98–111)
Creatinine, Ser: 0.86 mg/dL (ref 0.44–1.00)
GFR calc Af Amer: >60 mL/min (ref >60)
GFR calc non Af Amer: >60 mL/min (ref >60)
Glucose, Bld: 93 mg/dL (ref 70–99)
Potassium: 4.1 mmol/L (ref 3.5–5.1)
Sodium: 137 mmol/L (ref 135–145)
Total Bilirubin: 0.4 mg/dL (ref 0.3–1.2)
Total Protein: 5.6 g/dL — ABNORMAL LOW (ref 6.5–8.1)

## 2018-12-22 LAB — MAGNESIUM: Magnesium: 1.9 mg/dL (ref 1.7–2.4)

## 2018-12-22 LAB — PHOSPHORUS: Phosphorus: 3.5 mg/dL (ref 2.5–4.6)

## 2018-12-22 MED ORDER — ENOXAPARIN SODIUM 300 MG/3ML IJ SOLN
20.0000 mg | INTRAMUSCULAR | Status: DC
  Filled 2018-12-22: qty 0.2

## 2018-12-22 MED ORDER — ENOXAPARIN SODIUM 300 MG/3ML IJ SOLN
18.0000 mg | INTRAMUSCULAR | Status: DC
  Administered 2018-12-23: 18 mg via SUBCUTANEOUS
  Filled 2018-12-22 (×2): qty 0.18

## 2018-12-22 NOTE — Progress Notes (Signed)
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 Lyman Speller. Pamela House was left alone this am while her daughter ran an errand. While she was gone, pt removed her O2 and became SOB, activated EMS and was transported prior to her daughter or hospice being made aware. She is admitted with SOB, increasing O2 requirements, acute on chronic respiratory failure.  Patient's daughter Pamela House states patient lives at [facility]. Daughter provides care. She states her mother was probably anxious and called EMS. No complaints from pt other than SOB.  Updated report from RN. Patient is stable and on less oxygen today. Waiting on Covid 19 test.  V/S: 97.8 oral, 114./70, 72, RR 20, SPO2 98% @ 2.5l I&O: 720/850 Lab work: novel coronavirus collected but not yet resulted, Chloride 90, CO2 39, BUN 24, Albumin 2.6, total protein 5.6, 3.66, Hgb 10.1, HCT 33.2,   NO NEW MD NOTES TODAY.   IDT: Updated.   GOC: D/c home with hospice support once medically ready. Her daughter did not necessarily want her sent to hospital, but pt activated EMS herself.  PCG: Updated Pamela House by phone. Pamela House states she would like for patient to be discharged home as soon as possible. Had she been with her, she would not have sent her to the hospital.  Due to hospital restrictions related to covid 19, unable to visit patient.   Transfer summary and medication list faxed to unit to be placed on shadow chart.  Once pt ready for discharge, if EMS transportation is needed, please use [facility] as they contract this service for [facility].  At this time her daughter states she can bring her home by private vehicle.   Thank you, Farrel Gordon, RN, CCM [place] (in [place] under Hospice and [place]) [phone] (main #)

## 2018-12-22 NOTE — Progress Notes (Signed)
PROGRESS NOTE    Pamela House  BZJ:[id number] DOB: 04-02-1930 DOA: 12/20/2018 PCP: Wenda Low, MD   Brief Narrative:  HPI per Dr. Dessa Phi Pamela House is a 85 y.o. female with medical history significant of pulmonary fibrosis, chronic hypoxemic respiratory failure on 2-4 L nasal cannula O2, dementia. She was recently discharged from the hospital on 12/09/2018 after she was admitted for shortness of breath.  At the time of admission, she had baseline 2 L nasal cannula O2 requirement which was bumped up to 4 L on discharge home.  Patient called EMS today due to shortness of breath, at that time she was not on any nasal cannula O2 and her O2 sat was 52% on room air.  She was placed on nonrebreather mask and remained hypoxic around 85%.  History is limited due to patient's dementia.  She does admit to productive cough.  **Interim History Oxygen requirement is weaning but patient still feels short of breath.  Given another dose of IV Furosemide yesterday. Patient down to 2.5 Liters now an saturating well. Awaiting COVID-19 Test.   Assessment & Plan:   Principal Problem:   Acute on chronic respiratory failure with hypoxia (HCC) Active Problems:   Hypertension   ILD (interstitial lung disease) (HCC)   IPF (idiopathic pulmonary fibrosis) (HCC)   GERD (gastroesophageal reflux disease)   Dementia without behavioral disturbance (HCC)  Acute on chronic hypoxemic respiratory failure in the Setting of ILD/IPF with possible COVID Infection, improving -New baseline 4L [place] O2 since discharge from hospital March 2020 but she is now on 2.5 Liters and saturating well and desaturated on 1.5 Liters to 85% -VBG showed 7.356/63.1/59.0/35.5/88.0% -Has required HFNC in the ED and had to be placed on 10-15 Liters HFNC and has now been weaned down to 2.5liters -CXR showed advanced chronic interstitial lung disease/fibrosis, stable. No over pulmonary edema, no peripheral edema despite -BNP 1281.7 and was  given IV Lasix yesterday -No fever at home per daughter, she has been checking twice daily.  -Patient has A Cough but her main complaint is being "weak." -HIGH RISK COVID-19 TEST, RSV and Negative -D-Dimer was Elevated at 3.16 but recently had A CTA of the Chest on 12/07/2018 which showed No evidence of acute pulmonary embolus. Chronic pulmonary artery enlargement compatible with a degree of pulmonary artery hypertension. -Continue to Monitor Respiratory status and will consider repeating CTA of the Chest and obtaining LE Dopplers -Will need to await Novel Corona Virus Test result prior to D/C'ing back to Facility -Repeat CXR this AM showed "Advanced chronic interstitial changes/fibrosis throughout the lungs. Cardiomegaly. Chronic elevation of the right hemidiaphragm. No definite acute process although it is difficult to completely exclude with severity of chronic disease. No acute bony abnormality." -Would Like PT/OT to evaluate and Treat  Elevated Troponin -Demand ischemia likely.  -Trending Troponin and relatively Flat as went from 0.52 -> 0.53 -> 0.58 -> 0.60 -> 0.65; Will not Check anymore  -No complaints of chest pain  -Continue to Monitor and evaluate for CP  HTN -Continue Carvedilol 25 mg po BID and Amlodipine 5 mg po Daily   Dementia -At baseline, gets confused and repeats questions sometimes, but typically is oriented  -Continue Donepizil 5 mg po qHS and Mirtazapine 15 mg po Daily   GERD -C/w Pantoprazole 40 mg po Daily  Severe Malnutrition in the context of chronic Illness/Underweight -Patient is consulted for further evaluation recommendations -Continue Ensure Enlive p.o. 3 times daily as well as a multivitamin with  minerals daily -C/w Mirtazapine 15 mg po Daily   Hyponatremia -Mild at 134 and improved to 137 -Continue to Monitor and repeat CMP in AM   Hyperglycemia -Patient's Blood Sugar has been ranging from 93-132 -Continue to Monitor and if blood sugars remain  consistently elevated will place on Sensitive Novolog SSI AC  Normocytic Anemia -Patient's Hemoglobin/Hematocrit went from 10.8/33.6 -> 10.1/33.2 -Continue to Monitor for S/Sx of Bleeding -Repeat CBC in AM   Metabolic Alkalosis -In the setting of Diuresis with IV Lasix -CO2 was 39;  -Will not BMP in AM   PPE worn by physician: Pincus Badder, Gown, Gloves, Shoe cover and hair bouffant.  DVT prophylaxis: Enoxaparin 40 mg sq q24h Code Status: DO NOT RESUSCITATE Family Communication: No family  Disposition Plan: D/C Home with Home Hospice back to [place] if SARS-CoV-2 Virus is Negative   Consultants:   None   Procedures: None   Antimicrobials:  Anti-infectives (From admission, onward)   None     Subjective: Seen and examined at bedside states that her breathing was still a little bad but her main complaint was that she was feeling weak.  Also complaining about Mepilex pad on backside to prevent pressure sore and asking for a pill to make it "softer".  No chest pain, lightheadedness or dizziness. No other concerns or complaints at this time states that she enjoys her Ensure Enlive.  Objective: Vitals:   12/21/18 1956 12/21/18 2345 12/22/18 0417 12/22/18 0805  BP: 109/64 (!) 99/57 116/63 107/63  Pulse: 63 (!) 52 (!) 52 64  Resp: _0 Temp: 98.4 F (36.9 C) 98.6 F (37 C) 98.6 F (37 C) 98.7 F (37.1 C)  TempSrc: Oral Oral Oral Oral  SpO2: 100% 100% 100% 99%  Weight:      Height:        Intake/Output Summary (Last 24 hours) at 12/22/2018 8280 Last data filed at 12/21/2018 1400 Gross per 24 hour  Intake 720 ml  Output 850 ml  Net -130 ml   Filed Weights   12/20/18 0935  Weight: 36 kg   Examination: Physical Exam:  Constitutional: African-American female currently no acute distress appears calm but slightly uncomfortable and is complaining of weakness Eyes: Lids and conjunctive are normal.  Sclera anicteric ENMT: External ears and nose  appear normal.  Grossly normal hearing. Neck: Supple no JVD Respiratory: Diminished auscultation bilaterally no appreciable wheezing, rales, car.  Patient not tachypneic or using any accessory muscles to breathe but was wearing supplemental oxygen via nasal cannula at 2 L Cardiovascular: Rate and rhythm.  No appreciable murmurs, rubs, gallops. Abdomen: Soft, NT, ND. Bowel sounds present x4 GU: Deferred Musculoskeletal: No Contractures or cyanosis.  No joint deformities in the upper and lower extremities. Skin: No rashes or lesions on limited skin evaluation. Has a Mepilex Pad on backside Neurologic: CN 2-12 grossly intact.  Romberg cerebellar reflexes not assessed Psychiatric: Slightly impaired Judgment and insight.  She is awake and alert but not fully oriented.  Normal mood and affect  Data Reviewed: I have personally reviewed following labs and imaging studies  CBC: Recent Labs  Lab 12/20/18 0953 12/20/18 1017 12/20/18 1433 12/21/18 0339 12/22/18 0358  WBC 5.7  --  5.5 5.0 4.5  NEUTROABS 4.5  --   --  4.0 2.6  HGB 10.6* 11.9* 11.4* 10.8* 10.1*  HCT 34.4* 35.0* 36.1 33.6* 33.2*  MCV 92.0  --  89.8 90.1 90.7  PLT 306  --  351 317 539   Basic Metabolic Panel: Recent Labs  Lab 12/20/18 0953 12/20/18 1017 12/20/18 1433 12/21/18 0339 12/22/18 0358  NA 132* 133*  --  134* 137  K 4.2 4.2  --  4.5 4.1  CL 92*  --   --  92* 90*  CO2 30  --   --  32 39*  GLUCOSE 132*  --   --  108* 93  BUN 18  --   --  26* 24*  CREATININE 0.84  --  0.92 0.99 0.86  CALCIUM 9.9  --   --  9.7 9.5  MG  --   --   --   --  1.9  PHOS  --   --   --   --  3.5   GFR: Estimated Creatinine Clearance: 26.7 mL/min (by C-G formula based on SCr of 0.86 mg/dL). Liver Function Tests: Recent Labs  Lab 12/20/18 0953 12/21/18 0339 12/22/18 0358  AST 46* 30 22  ALT 39 29 27  ALKPHOS 82 76 61  BILITOT 0.5 0.4 0.4  PROT 6.8 6.4* 5.6*  ALBUMIN 3.5 3.1* 2.6*   No results for input(s): LIPASE, AMYLASE in  the last 168 hours. No results for input(s): AMMONIA in the last 168 hours. Coagulation Profile: No results for input(s): INR, PROTIME in the last 168 hours. Cardiac Enzymes: Recent Labs  Lab 12/20/18 0953 12/20/18 1433 12/21/18 0946 12/21/18 1428 12/21/18 2055  TROPONINI 0.52* 0.53* 0.58* 0.60* 0.65*   BNP (last 3 results) No results for input(s): PROBNP in the last 8760 hours. HbA1C: No results for input(s): HGBA1C in the last 72 hours. CBG: No results for input(s): GLUCAP in the last 168 hours. Lipid Profile: No results for input(s): CHOL, HDL, LDLCALC, TRIG, CHOLHDL, LDLDIRECT in the last 72 hours. Thyroid Function Tests: No results for input(s): TSH, T4TOTAL, FREET4, T3FREE, THYROIDAB in the last 72 hours. Anemia Panel: Recent Labs    12/20/18 1433  FERRITIN 65   Sepsis Labs: Recent Labs  Lab 12/20/18 1433  PROCALCITON <0.10    Recent Results (from the past 240 hour(s))  Respiratory Panel by PCR     Status: None   Collection Time: 12/20/18 11:40 AM  Result Value Ref Range Status   Adenovirus NOT DETECTED NOT DETECTED Final   Coronavirus 229E NOT DETECTED NOT DETECTED Final    Comment: (NOTE) The Coronavirus on the Respiratory Panel, DOES NOT test for the novel  Coronavirus (2019 nCoV)    Coronavirus HKU1 NOT DETECTED NOT DETECTED Final   Coronavirus NL63 NOT DETECTED NOT DETECTED Final   Coronavirus OC43 NOT DETECTED NOT DETECTED Final   Metapneumovirus NOT DETECTED NOT DETECTED Final   Rhinovirus / Enterovirus NOT DETECTED NOT DETECTED Final   Influenza A NOT DETECTED NOT DETECTED Final   Influenza B NOT DETECTED NOT DETECTED Final   Parainfluenza Virus 1 NOT DETECTED NOT DETECTED Final   Parainfluenza Virus 2 NOT DETECTED NOT DETECTED Final   Parainfluenza Virus 3 NOT DETECTED NOT DETECTED Final   Parainfluenza Virus 4 NOT DETECTED NOT DETECTED Final   Respiratory Syncytial Virus NOT DETECTED NOT DETECTED Final   Bordetella pertussis NOT DETECTED NOT  DETECTED Final   Chlamydophila pneumoniae NOT DETECTED NOT DETECTED Final   Mycoplasma pneumoniae NOT DETECTED NOT DETECTED Final    Comment: Performed at [facility], [place]. [address], [place], [place] [address]    Radiology Studies: Dg Chest Port 1 View  Result Date: 12/20/2018 CLINICAL DATA:  Shortness  of breath, cough EXAM: PORTABLE CHEST 1 VIEW COMPARISON:  12/07/2018 FINDINGS: Cardiomegaly. Diffuse peripheral interstitial opacities are again noted compatible with advanced interstitial lung disease, stable. No acute confluent airspace opacities or effusions. No acute bony abnormality. IMPRESSION: Advanced chronic interstitial lung disease/fibrosis, stable. Cardiomegaly. No definite acute process. Electronically Signed   By: Rolm Baptise M.D.   On: 12/20/2018 09:51   Scheduled Meds: . amLODipine  5 mg Oral Daily  . aspirin  81 mg Oral Daily  . carvedilol  25 mg Oral BID  . donepezil  5 mg Oral QHS  . enoxaparin (LOVENOX) injection  40 mg Subcutaneous Q24H  . feeding supplement (ENSURE ENLIVE)  237 mL Oral TID BM  . mirtazapine  15 mg Oral QHS  . multivitamin with minerals  1 tablet Oral Daily  . pantoprazole  40 mg Oral Daily   Continuous Infusions:   LOS: 2 days   Kerney Elbe, DO [facility] PAGER is on [place]  If 7PM-7AM, please contact night-coverage [email or web address] Password [email or web address] 12/22/2018, 8:06 AM

## 2018-12-23 DIAGNOSIS — R6889 Other general symptoms and signs: Secondary | ICD-10-CM

## 2018-12-23 LAB — COMPREHENSIVE METABOLIC PANEL
ALT: 20 U/L (ref 0–44)
AST: 16 U/L (ref 15–41)
Albumin: 2.6 g/dL — ABNORMAL LOW (ref 3.5–5.0)
Alkaline Phosphatase: 63 U/L (ref 38–126)
Anion gap: 9 (ref 5–15)
BUN: 24 mg/dL — ABNORMAL HIGH (ref 8–23)
CO2: 37 mmol/L — ABNORMAL HIGH (ref 22–32)
Calcium: 9.6 mg/dL (ref 8.9–10.3)
Chloride: 91 mmol/L — ABNORMAL LOW (ref 98–111)
Creatinine, Ser: 0.79 mg/dL (ref 0.44–1.00)
GFR calc Af Amer: >60 mL/min (ref >60)
GFR calc non Af Amer: >60 mL/min (ref >60)
Glucose, Bld: 91 mg/dL (ref 70–99)
Potassium: 4.8 mmol/L (ref 3.5–5.1)
Sodium: 137 mmol/L (ref 135–145)
Total Bilirubin: 0.4 mg/dL (ref 0.3–1.2)
Total Protein: 5.5 g/dL — ABNORMAL LOW (ref 6.5–8.1)

## 2018-12-23 LAB — CBC WITH DIFFERENTIAL/PLATELET
Abs Immature Granulocytes: 0.02 10*3/uL (ref 0.00–0.07)
Basophils Absolute: 0 10*3/uL (ref 0.0–0.1)
Basophils Relative: 1 %
Eosinophils Absolute: 0.4 10*3/uL (ref 0.0–0.5)
Eosinophils Relative: 8 %
HCT: 36 % (ref 36.0–46.0)
Hemoglobin: 11 g/dL — ABNORMAL LOW (ref 12.0–15.0)
Immature Granulocytes: 0 %
Lymphocytes Relative: 20 %
Lymphs Abs: 0.9 10*3/uL (ref 0.7–4.0)
MCH: 28.2 pg (ref 26.0–34.0)
MCHC: 30.6 g/dL (ref 30.0–36.0)
MCV: 92.3 fL (ref 80.0–100.0)
Monocytes Absolute: 0.6 10*3/uL (ref 0.1–1.0)
Monocytes Relative: 13 %
Neutro Abs: 2.7 10*3/uL (ref 1.7–7.7)
Neutrophils Relative %: 58 %
Platelets: 322 10*3/uL (ref 150–400)
RBC: 3.9 MIL/uL (ref 3.87–5.11)
RDW: 13.5 % (ref 11.5–15.5)
WBC: 4.6 10*3/uL (ref 4.0–10.5)
nRBC: 0 % (ref 0.0–0.2)

## 2018-12-23 LAB — MAGNESIUM: Magnesium: 2.2 mg/dL (ref 1.7–2.4)

## 2018-12-23 LAB — D-DIMER, QUANTITATIVE: D-Dimer, Quant: 1.15 ug/mL-FEU — ABNORMAL HIGH (ref 0.00–0.50)

## 2018-12-23 LAB — PHOSPHORUS: Phosphorus: 3.3 mg/dL (ref 2.5–4.6)

## 2018-12-23 MED ORDER — ENSURE ENLIVE PO LIQD: 237.0000 mL | Freq: Three times a day (TID) | ORAL | 12 refills | Status: AC

## 2018-12-23 MED ORDER — ENOXAPARIN SODIUM 30 MG/0.3ML ~~LOC~~ SOLN: 30.0000 mg | SUBCUTANEOUS | Status: DC

## 2018-12-23 MED ORDER — ADULT MULTIVITAMIN W/MINERALS CH: 1.0000 | ORAL_TABLET | Freq: Every day | ORAL | 0 refills | Status: AC

## 2018-12-23 NOTE — Progress Notes (Signed)
PT Cancellation Note  Patient Details Name: Pamela House MRN: [id number] DOB: 10-22-1929   Cancelled Treatment:    Reason Eval/Treat Not Completed: Other (comment). Per nursing pt has been up in room with assist. Pt to return home with hospice services today.   Shary Decamp [facility] 12/23/2018, 3:10 PM [place] Pager [phone] Office [phone]

## 2018-12-23 NOTE — Progress Notes (Signed)
Pt discharge education and instructions completed with pt's daughter Vernie Ammons over the phone; she voices understanding and denies any questions. Pt IV removed; pt discharge home with daughter to come pick to transport home. Pt transported off unit via wheelchair with belongings to the side. Pt handed her prescriptions for multivitamin and to pick up other prescription from preferred pharmacy on file. Delia Heady RN

## 2018-12-23 NOTE — Discharge Summary (Signed)
Physician Discharge Summary  Pamela House WJX:[id number] DOB: Oct 01, 1929 DOA: 12/20/2018  PCP: Wenda Low, MD  Admit date: 12/20/2018 Discharge date: 12/23/2018  Admitted From: Home Disposition: Home with Hospice  Recommendations for Outpatient Follow-up:  1. Follow up Care per Home Hospice Protocol  2. Please obtain CMP/CBC, Mag, Phos in one week 3. Follow up on COVID-19 Testing with SARS-CoV-2 Viurs Swab  Home Health: No  Equipment/Devices: None  Discharge Condition: Stable  CODE STATUS: DO NOT RESUSCITATE Diet recommendation: Regular Diet  Brief/Interim Summary: HPI per Dr. [name] a 85 y.o.femalewith medical history significant ofpulmonary fibrosis, chronic hypoxemic respiratory failure on2-4 L nasal cannula O2,dementia. She was recently discharged from the hospital on 12/09/2018 after she was admitted for shortness of breath. At the time of admission, she had baseline 2 L nasal cannula O2 requirement which was bumped up to 4 L on discharge home. Patient called EMS today due to shortness of breath, at that time she was not on any nasal cannula O2 and her O2 sat was 52% on room air. She was placed on nonrebreather mask and remained hypoxic around 85%. History is limited due to patient's dementia. She does admit to productive cough.  **Interim History Oxygen requirement is weaning but patient still feels short of breath.  Given another dose of IV Furosemide the day before yesterday. Patient down to 2.5 Liters now an saturating well. Awaiting COVID-19 Test. She improved significant;y and was deemed medically stable to be D/C'd back Home with Home Hospice.    Discharge Diagnoses:  Principal Problem:   Acute on chronic respiratory failure with hypoxia (HCC) Active Problems:   Hypertension   ILD (interstitial lung disease) ([place])   IPF (idiopathic pulmonary fibrosis) (HCC)   GERD (gastroesophageal reflux disease)   Dementia without behavioral  disturbance (HCC)  Acuteon chronichypoxemic respiratory failure in the Setting of ILD/IPF with possible COVID Infection, improved -New baseline 4L [place] O2 since discharge from hospital March 2020 but she is now on 2.5 Liters and saturating well and desaturated on 1.5 Liters to 85% -VBG showed 7.356/63.1/59.0/35.5/88.0% -Has required HFNC in the ED and had to be placed on 10-15 Liters HFNC and has now been weaned down to 2.5liters -CXR showed advanced chronic interstitial lung disease/fibrosis, stable.No over pulmonary edema, no peripheral edema despite -BNP 1281.7 and was given IV Lasix the day before yesterday -No fever at home per daughter, she has been checking twice daily.  -Patient has A Cough but her main complaint is being "weak." -HIGH RISK COVID-19 TEST, RSV and Negative -D-Dimer was Elevated at 3.16 but recently had A CTA of the Chest on 12/07/2018 which showed No evidence of acute pulmonary embolus. Chronic pulmonary artery enlargement compatible with a degree of pulmonary artery hypertension. -D-Dimer Trending down  -Continue to Monitor Respiratory status and will consider repeating CTA of the Chest and obtaining LE Dopplers -Will need to await Novel Corona Virus Test result prior to D/C'ing back to Facility -Repeat CXR this AM showed "Advanced chronic interstitial changes/fibrosis throughout the lungs. Cardiomegaly. Chronic elevation of the right hemidiaphragm. No definite acute process although it is difficult to completely exclude with severity of chronic disease. No acute bony abnormality." -Resume Home Hospice Services   Elevated Troponin -Demand ischemia likely.  -Trending Troponin and relatively Flat as went from 0.52 -> 0.53 -> 0.58 -> 0.60 -> 0.65; Will not Check anymore  -No complaints of chest pain but was complaining of Chest Soreness from Coughing  -Continue to Monitor  and evaluate for CP  HTN -Continue Carvedilol 25 mg po BID and Amlodipine 5 mg po Daily    Dementia -At baseline, gets confused and repeats questions sometimes, but typically is oriented  -Continue Donepizil 5 mg po qHS and Mirtazapine 15 mg po Daily   GERD -C/w Pantoprazole 40 mg po Daily  Severe Malnutrition in the context of chronic Illness/Underweight -Patient is consulted for further evaluation recommendations -Continue Ensure Enlive p.o. 3 times daily as well as a multivitamin with minerals daily -C/w Mirtazapine 15 mg po Daily   Hyponatremia -Mild at 134 and improved to 137 -Continue to Monitor and repeat CMP in AM   Hyperglycemia -Patient's Blood Sugar has been ranging from 91-132 -Continue to Monitor and if blood sugars remain consistently elevated will place on Sensitive Novolog SSI AC -Further Care per Hospice Protocol  Normocytic Anemia -Patient's Hemoglobin/Hematocrit went from 10.8/33.6 -> 10.1/33.2 -> 11.0/36.0 -Continue to Monitor for S/Sx of Bleeding -Repeat CBC as an outpatient   Metabolic Alkalosis -In the setting of Diuresis with IV Lasix -CO2 was 39 and now 37;  -Repeat CMP in the outpatient setting   Discharge Instructions  Discharge Instructions    Call MD for:  difficulty breathing, headache or visual disturbances   Complete by:  As directed    Call MD for:  extreme fatigue   Complete by:  As directed    Call MD for:  hives   Complete by:  As directed    Call MD for:  persistant dizziness or light-headedness   Complete by:  As directed    Call MD for:  persistant nausea and vomiting   Complete by:  As directed    Call MD for:  redness, tenderness, or signs of infection (pain, swelling, redness, odor or green/yellow discharge around incision site)   Complete by:  As directed    Call MD for:  severe uncontrolled pain   Complete by:  As directed    Call MD for:  temperature >100.4   Complete by:  As directed    Diet - low sodium heart healthy   Complete by:  As directed    Discharge instructions   Complete by:  As  directed    You were cared for by a hospitalist during your hospital stay. If you have any questions about your discharge medications or the care you received while you were in the hospital after you are discharged, you can call the unit and ask to speak with the hospitalist on call if the hospitalist that took care of you is not available. Once you are discharged, your primary care physician will handle any further medical issues. Please note that NO REFILLS for any discharge medications will be authorized once you are discharged, as it is imperative that you return to your primary care physician (or establish a relationship with a primary care physician if you do not have one) for your aftercare needs so that they can reassess your need for medications and monitor your lab values.  Follow up with PCP and Further Care per Hospice per Protocol. Take all medications as prescribed.  Person Under Monitoring Name: Pamela House  Location: [address] [address] [place] [address]   Infection Prevention Recommendations for Individuals Confirmed to have, or Being Evaluated for, 2019 Novel Coronavirus (COVID-19) Infection Who Receive Care at Home  Individuals who are confirmed to have, or are being evaluated for, COVID-19 should follow the prevention steps below until a healthcare provider or local or  state health department says they can return to normal activities.  Stay home except to get medical care You should restrict activities outside your home, except for getting medical care. Do not go to work, school, or public areas, and do not use public transportation or taxis.  Call ahead before visiting your doctor Before your medical appointment, call the healthcare provider and tell them that you have, or are being evaluated for, COVID-19 infection. This will help the healthcare provider's office take steps to keep other people from getting infected. Ask your healthcare provider to call the local or  state health department.  Monitor your symptoms Seek prompt medical attention if your illness is worsening (e.g., difficulty breathing). Before going to your medical appointment, call the healthcare provider and tell them that you have, or are being evaluated for, COVID-19 infection. Ask your healthcare provider to call the local or state health department.  Wear a facemask You should wear a facemask that covers your nose and mouth when you are in the same room with other people and when you visit a healthcare provider. People who live with or visit you should also wear a facemask while they are in the same room with you.  Separate yourself from other people in your home As much as possible, you should stay in a different room from other people in your home. Also, you should use a separate bathroom, if available.  Avoid sharing household items You should not share dishes, drinking glasses, cups, eating utensils, towels, bedding, or other items with other people in your home. After using these items, you should wash them thoroughly with soap and water.  Cover your coughs and sneezes Cover your mouth and nose with a tissue when you cough or sneeze, or you can cough or sneeze into your sleeve. Throw used tissues in a lined trash can, and immediately wash your hands with soap and water for at least 20 seconds or use an alcohol-based hand rub.  Wash your [facility] your hands often and thoroughly with soap and water for at least 20 seconds. You can use an alcohol-based hand sanitizer if soap and water are not available and if your hands are not visibly dirty. Avoid touching your eyes, nose, and mouth with unwashed hands.   Prevention Steps for Caregivers and Household Members of Individuals Confirmed to have, or Being Evaluated for, COVID-19 Infection Being Cared for in the Home  If you live with, or provide care at home for, a person confirmed to have, or being evaluated for, COVID-19  infection please follow these guidelines to prevent infection:  Follow healthcare provider's instructions Make sure that you understand and can help the patient follow any healthcare provider instructions for all care.  Provide for the patient's basic needs You should help the patient with basic needs in the home and provide support for getting groceries, prescriptions, and other personal needs.  Monitor the patient's symptoms If they are getting sicker, call his or her medical provider and tell them that the patient has, or is being evaluated for, COVID-19 infection. This will help the healthcare provider's office take steps to keep other people from getting infected. Ask the healthcare provider to call the local or state health department.  Limit the number of people who have contact with the patient If possible, have only one caregiver for the patient. Other household members should stay in another home or place of residence. If this is not possible, they should stay in another room, or  be separated from the patient as much as possible. Use a separate bathroom, if available. Restrict visitors who do not have an essential need to be in the home.  Keep older adults, very young children, and other sick people away from the patient Keep older adults, very young children, and those who have compromised immune systems or chronic health conditions away from the patient. This includes people with chronic heart, lung, or kidney conditions, diabetes, and cancer.  Ensure good ventilation Make sure that shared spaces in the home have good air flow, such as from an air conditioner or an opened window, weather permitting.  Wash your hands often Wash your hands often and thoroughly with soap and water for at least 20 seconds. You can use an alcohol based hand sanitizer if soap and water are not available and if your hands are not visibly dirty. Avoid touching your eyes, nose, and mouth with  unwashed hands. Use disposable paper towels to dry your hands. If not available, use dedicated cloth towels and replace them when they become wet.  Wear a facemask and gloves Wear a disposable facemask at all times in the room and gloves when you touch or have contact with the patient's blood, body fluids, and/or secretions or excretions, such as sweat, saliva, sputum, nasal mucus, vomit, urine, or feces.  Ensure the mask fits over your nose and mouth tightly, and do not touch it during use. Throw out disposable facemasks and gloves after using them. Do not reuse. Wash your hands immediately after removing your facemask and gloves. If your personal clothing becomes contaminated, carefully remove clothing and launder. Wash your hands after handling contaminated clothing. Place all used disposable facemasks, gloves, and other waste in a lined container before disposing them with other household waste. Remove gloves and wash your hands immediately after handling these items.  Do not share dishes, glasses, or other household items with the patient Avoid sharing household items. You should not share dishes, drinking glasses, cups, eating utensils, towels, bedding, or other items with a patient who is confirmed to have, or being evaluated for, COVID-19 infection. After the person uses these items, you should wash them thoroughly with soap and water.  Wash laundry thoroughly Immediately remove and wash clothes or bedding that have blood, body fluids, and/or secretions or excretions, such as sweat, saliva, sputum, nasal mucus, vomit, urine, or feces, on them. Wear gloves when handling laundry from the patient. Read and follow directions on labels of laundry or clothing items and detergent. In general, wash and dry with the warmest temperatures recommended on the label.  Clean all areas the individual has used often Clean all touchable surfaces, such as counters, tabletops, doorknobs, bathroom fixtures,  toilets, phones, keyboards, tablets, and bedside tables, every day. Also, clean any surfaces that may have blood, body fluids, and/or secretions or excretions on them. Wear gloves when cleaning surfaces the patient has come in contact with. Use a diluted bleach solution (e.g., dilute bleach with 1 part bleach and 10 parts water) or a household disinfectant with a label that says EPA-registered for coronaviruses. To make a bleach solution at home, add 1 tablespoon of bleach to 1 quart (4 cups) of water. For a larger supply, add  cup of bleach to 1 gallon (16 cups) of water. Read labels of cleaning products and follow recommendations provided on product labels. Labels contain instructions for safe and effective use of the cleaning product including precautions you should take when applying the product, such as  wearing gloves or eye protection and making sure you have good ventilation during use of the product. Remove gloves and wash hands immediately after cleaning.  Monitor yourself for signs and symptoms of illness Caregivers and household members are considered close contacts, should monitor their health, and will be asked to limit movement outside of the home to the extent possible. Follow the monitoring steps for close contacts listed on the symptom monitoring form.   ? If you have additional questions, contact your local health department or call the epidemiologist on call at [phone] (available 24/7). ? This guidance is subject to change. For the most up-to-date guidance from [facility], please refer to their website: YouBlogs.pl   Increase activity slowly   Complete by:  As directed      Allergies as of 12/23/2018      Reactions   Ace Inhibitors Cough   Hyzaar [losartan Potassium-hctz] Cough   Lipitor [atorvastatin] Other (See Comments)   Muscle fatigue   Losartan Cough   Tiazac [diltiazem Hcl Er Beads] Cough       Medication List    TAKE these medications   albuterol 1.25 MG/3ML nebulizer solution Commonly known as:  ACCUNEB Take 2.5 mg by nebulization 4 (four) times daily.   ALPRAZolam 0.5 MG tablet Commonly known as:  XANAX Take 1 tablet (0.5 mg total) by mouth 2 (two) times daily as needed for anxiety. Prescription needed for skilled nursing   amLODipine 5 MG tablet Commonly known as:  NORVASC Take 5 mg by mouth every evening.   aspirin 81 MG tablet Take 81 mg by mouth daily.   benzonatate 200 MG capsule Commonly known as:  TESSALON Take 1 capsule (200 mg total) by mouth 3 (three) times daily. What changed:    when to take this  reasons to take this   carvedilol 25 MG tablet Commonly known as:  COREG Take 1 tablet (25 mg total) by mouth 2 (two) times daily.   donepezil 5 MG tablet Commonly known as:  ARICEPT Take 5 mg by mouth at bedtime.   feeding supplement (ENSURE ENLIVE) Liqd Take 237 mLs by mouth 3 (three) times daily between meals.   fluticasone 50 MCG/ACT nasal spray Commonly known as:  FLONASE Place 2 sprays into both nostrils daily.   HYDROcodone-homatropine 5-1.5 MG/5ML syrup Commonly known as:  HYCODAN Take 5 mLs by mouth 2 (two) times daily as needed for cough.   latanoprost 0.005 % ophthalmic solution Commonly known as:  XALATAN Place 1 drop into both eyes at bedtime.   mirtazapine 15 MG tablet Commonly known as:  REMERON Take 15 mg by mouth at bedtime.   multivitamin with minerals Tabs tablet Take 1 tablet by mouth daily. Start taking on:  December 24, 2018   omeprazole 20 MG capsule Commonly known as:  PRILOSEC TAKE 1 CAPSULE BY MOUTH TWICE A DAY What changed:  when to take this   polyethylene glycol 17 g packet Commonly known as:  MIRALAX / GLYCOLAX Take 17 g by mouth daily as needed for mild constipation.       Allergies  Allergen Reactions  . Ace Inhibitors Cough  . Hyzaar [Losartan Potassium-Hctz] Cough  . Lipitor [Atorvastatin]  Other (See Comments)    Muscle fatigue  . Losartan Cough  . Tiazac [Diltiazem Hcl Er Beads] Cough   Consultations:  None  Procedures/Studies: Dg Chest 2 View  Result Date: 12/07/2018 CLINICAL DATA:  Dyspnea.  History of hypertension and breast cancer. EXAM: CHEST - 2 VIEW  COMPARISON:  11/27/2018 and older exams. FINDINGS: Mild enlargement of the cardiac silhouette. No mediastinal or hilar masses. Lungs show heterogeneous coarse interstitial thickening with intervening hazy airspace type opacities, which predominate in the peripheral lungs and at the bases, without significant change from prior exams. No pleural effusion or pneumothorax. Skeletal structures are intact. IMPRESSION: 1. Advanced interstitial lung disease. Superimposed pneumonia is possible but not definitively seen. No evidence of pulmonary edema. 2. Mild stable cardiomegaly. Electronically Signed   By: Lajean Manes M.D.   On: 12/07/2018 18:10   Dg Chest 2 View  Result Date: 11/27/2018 CLINICAL DATA:  Weakness for 3 days. EXAM: CHEST - 2 VIEW COMPARISON:  Chest radiographs 11/02/2018 and CT 11/06/2018 FINDINGS: The cardiomediastinal silhouette is unchanged with mild cardiomegaly again noted. There is chronic elevation of the right hemidiaphragm. Lung volumes are unchanged with similar appearance of extensive chronic interstitial lung disease compared to the prior radiographs. No definite acute airspace consolidation, pleural effusion, pneumothorax is identified. No acute osseous abnormality is seen. IMPRESSION: Chronic interstitial lung disease without evidence of acute abnormality. Electronically Signed   By: Logan Bores M.D.   On: 11/27/2018 15:52   Ct Angio Chest Pe W And/or Wo Contrast  Result Date: 12/07/2018 CLINICAL DATA:  85 year old female on home oxygen with chest pain and shortness of breath. EXAM: CT ANGIOGRAPHY CHEST WITH CONTRAST TECHNIQUE: Multidetector CT imaging of the chest was performed using the standard  protocol during bolus administration of intravenous contrast. Multiplanar CT image reconstructions and MIPs were obtained to evaluate the vascular anatomy. CONTRAST:  2m OMNIPAQUE IOHEXOL 350 MG/ML SOLN COMPARISON:  Chest radiographs earlier today. Chest CT 11/06/2018. FINDINGS: Cardiovascular: Excellent contrast bolus timing in the pulmonary arterial tree. Mild respiratory motion. No focal filling defect identified in the pulmonary arteries to suggest acute pulmonary embolism. Chronic pulmonary artery enlargement. Chronic Calcified aortic atherosclerosis. Calcified coronary artery atherosclerosis. Stable cardiomegaly. No pericardial effusion. Mediastinum/Nodes: Stable, negative. Lungs/Pleura: Extensive chronic interstitial and fibrotic appearing lung disease with peripheral and right basilar areas of honeycombing and bronchiectasis. Stable low lung volumes. Stable major airway patency. No acute pulmonary opacity identified. No pleural effusion. Upper Abdomen: Negative visible liver, spleen, pancreas, kidneys, and stomach. Musculoskeletal: Stable visualized osseous structures. Mild chronic T11 superior endplate compression. Review of the MIP images confirms the above findings. IMPRESSION: 1. No evidence of acute pulmonary embolus. Chronic pulmonary artery enlargement compatible with a degree of pulmonary artery hypertension. 2. Chronic pulmonary fibrosis. No acute pulmonary abnormality. 3. Calcified aortic and coronary artery atherosclerosis. Stable cardiomegaly. Electronically Signed   By: HGenevie AnnM.D.   On: 12/07/2018 22:31   Dg Chest Port 1 View  Result Date: 12/22/2018 CLINICAL DATA:  Shortness of Breath EXAM: PORTABLE CHEST 1 VIEW COMPARISON:  12/20/2018 FINDINGS: Advanced chronic interstitial changes/fibrosis throughout the lungs. Cardiomegaly. Chronic elevation of the right hemidiaphragm. No definite acute process although it is difficult to completely exclude with severity of chronic disease. No acute  bony abnormality. IMPRESSION: Advanced chronic interstitial lung disease/fibrosis. No definite acute process. Cardiomegaly. No change. Electronically Signed   By: KRolm BaptiseM.D.   On: 12/22/2018 08:04   Dg Chest Port 1 View  Result Date: 12/20/2018 CLINICAL DATA:  Shortness of breath, cough EXAM: PORTABLE CHEST 1 VIEW COMPARISON:  12/07/2018 FINDINGS: Cardiomegaly. Diffuse peripheral interstitial opacities are again noted compatible with advanced interstitial lung disease, stable. No acute confluent airspace opacities or effusions. No acute bony abnormality. IMPRESSION: Advanced chronic interstitial lung disease/fibrosis, stable. Cardiomegaly. No definite acute  process. Electronically Signed   By: Rolm Baptise M.D.   On: 12/20/2018 09:51    Subjective: Seen and examined at bedside and was doing okay.  Was complaining of some chest soreness from coughing.  And some leg pain specifically having some neuropathy.  No nausea or vomiting.  No other concerns or complaints at this time and shortness of breath is stable.   Discharge Exam: Vitals:   12/22/18 2014 12/23/18 0530  BP: (!) 119/50 124/70  Pulse: 76 69  Resp:    Temp: 98.3 F (36.8 C) 98.2 F (36.8 C)  SpO2: 93% 100%   Vitals:   12/22/18 0854 12/22/18 1550 12/22/18 2014 12/23/18 0530  BP: 114/70 112/68 (!) 119/50 124/70  Pulse: 72 76 76 69  Resp: 20 20    Temp: 97.8 F (36.6 C) 97.8 F (36.6 C) 98.3 F (36.8 C) 98.2 F (36.8 C)  TempSrc: Oral Oral Oral Oral  SpO2: 98% 92% 93% 100%  Weight:      Height:       General: Pt is awake, not in acute distress Cardiovascular: RRR, S1/S2 +, no rubs, no gallops Respiratory: Diminished bilaterally, no wheezing, no rhonchi Abdominal: Soft, NT, ND, bowel sounds + Extremities: no LE edema, no cyanosis  The results of significant diagnostics from this hospitalization (including imaging, microbiology, ancillary and laboratory) are listed below for reference.    Microbiology: Recent  Results (from the past 240 hour(s))  Respiratory Panel by PCR     Status: None   Collection Time: 12/20/18 11:40 AM  Result Value Ref Range Status   Adenovirus NOT DETECTED NOT DETECTED Final   Coronavirus 229E NOT DETECTED NOT DETECTED Final    Comment: (NOTE) The Coronavirus on the Respiratory Panel, DOES NOT test for the novel  Coronavirus (2019 nCoV)    Coronavirus HKU1 NOT DETECTED NOT DETECTED Final   Coronavirus NL63 NOT DETECTED NOT DETECTED Final   Coronavirus OC43 NOT DETECTED NOT DETECTED Final   Metapneumovirus NOT DETECTED NOT DETECTED Final   Rhinovirus / Enterovirus NOT DETECTED NOT DETECTED Final   Influenza A NOT DETECTED NOT DETECTED Final   Influenza B NOT DETECTED NOT DETECTED Final   Parainfluenza Virus 1 NOT DETECTED NOT DETECTED Final   Parainfluenza Virus 2 NOT DETECTED NOT DETECTED Final   Parainfluenza Virus 3 NOT DETECTED NOT DETECTED Final   Parainfluenza Virus 4 NOT DETECTED NOT DETECTED Final   Respiratory Syncytial Virus NOT DETECTED NOT DETECTED Final   Bordetella pertussis NOT DETECTED NOT DETECTED Final   Chlamydophila pneumoniae NOT DETECTED NOT DETECTED Final   Mycoplasma pneumoniae NOT DETECTED NOT DETECTED Final    Comment: Performed at [facility], [address] [place], [place] [address]   Labs: BNP (last 3 results) Recent Labs    11/02/18 1200 12/07/18 1559 12/20/18 0953  BNP 409.2* 891.1* 0,102.7*   Basic Metabolic Panel: Recent Labs  Lab 12/20/18 0953 12/20/18 1017 12/20/18 1433 12/21/18 0339 12/22/18 0358 12/23/18 0834  NA 132* 133*  --  134* 137 137  K 4.2 4.2  --  4.5 4.1 4.8  CL 92*  --   --  92* 90* 91*  CO2 30  --   --  32 39* 37*  GLUCOSE 132*  --   --  108* 93 91  BUN 18  --   --  26* 24* 24*  CREATININE 0.84  --  0.92 0.99 0.86 0.79  CALCIUM 9.9  --   --  9.7  9.5 9.6  MG  --   --   --   --  1.9 2.2  PHOS  --   --   --   --  3.5 3.3   Liver Function Tests: Recent Labs  Lab 12/20/18 0953  12/21/18 0339 12/22/18 0358 12/23/18 0834  AST 46* _0 ALT 39 _1 ALKPHOS 82 76 61 63  BILITOT 0.5 0.4 0.4 0.4  PROT 6.8 6.4* 5.6* 5.5*  ALBUMIN 3.5 3.1* 2.6* 2.6*   No results for input(s): LIPASE, AMYLASE in the last 168 hours. No results for input(s): AMMONIA in the last 168 hours. CBC: Recent Labs  Lab 12/20/18 0953 12/20/18 1017 12/20/18 1433 12/21/18 0339 12/22/18 0358 12/23/18 0834  WBC 5.7  --  5.5 5.0 4.5 4.6  NEUTROABS 4.5  --   --  4.0 2.6 2.7  HGB 10.6* 11.9* 11.4* 10.8* 10.1* 11.0*  HCT 34.4* 35.0* 36.1 33.6* 33.2* 36.0  MCV 92.0  --  89.8 90.1 90.7 92.3  PLT 306  --  351 317 322 322   Cardiac Enzymes: Recent Labs  Lab 12/20/18 0953 12/20/18 1433 12/21/18 0946 12/21/18 1428 12/21/18 2055  TROPONINI 0.52* 0.53* 0.58* 0.60* 0.65*   BNP: Invalid input(s): POCBNP CBG: No results for input(s): GLUCAP in the last 168 hours. D-Dimer Recent Labs    12/20/18 1433 12/23/18 0834  DDIMER 3.16* 1.15*   Hgb A1c No results for input(s): HGBA1C in the last 72 hours. Lipid Profile No results for input(s): CHOL, HDL, LDLCALC, TRIG, CHOLHDL, LDLDIRECT in the last 72 hours. Thyroid function studies No results for input(s): TSH, T4TOTAL, T3FREE, THYROIDAB in the last 72 hours.  Invalid input(s): FREET3 Anemia work up Recent Labs    12/20/18 1433  FERRITIN 65   Urinalysis    Component Value Date/Time   COLORURINE YELLOW 11/27/2018 1726   APPEARANCEUR CLEAR 11/27/2018 1726   LABSPEC 1.012 11/27/2018 1726   LABSPEC 1.010 10/21/2015 1135   PHURINE 7.0 11/27/2018 1726   GLUCOSEU NEGATIVE 11/27/2018 1726   GLUCOSEU Negative 10/21/2015 1135   HGBUR NEGATIVE 11/27/2018 1726   BILIRUBINUR NEGATIVE 11/27/2018 1726   BILIRUBINUR Negative 10/21/2015 1135   KETONESUR NEGATIVE 11/27/2018 1726   PROTEINUR NEGATIVE 11/27/2018 1726   UROBILINOGEN 0.2 10/21/2015 1135   NITRITE NEGATIVE 11/27/2018 1726   [place] 11/27/2018 1726    LEUKOCYTESUR Negative 10/21/2015 1135   Sepsis Labs Invalid input(s): PROCALCITONIN,  WBC,  LACTICIDVEN Microbiology Recent Results (from the past 240 hour(s))  Respiratory Panel by PCR     Status: None   Collection Time: 12/20/18 11:40 AM  Result Value Ref Range Status   Adenovirus NOT DETECTED NOT DETECTED Final   Coronavirus 229E NOT DETECTED NOT DETECTED Final    Comment: (NOTE) The Coronavirus on the Respiratory Panel, DOES NOT test for the novel  Coronavirus (2019 nCoV)    Coronavirus HKU1 NOT DETECTED NOT DETECTED Final   Coronavirus NL63 NOT DETECTED NOT DETECTED Final   Coronavirus OC43 NOT DETECTED NOT DETECTED Final   Metapneumovirus NOT DETECTED NOT DETECTED Final   Rhinovirus / Enterovirus NOT DETECTED NOT DETECTED Final   Influenza A NOT DETECTED NOT DETECTED Final   Influenza B NOT DETECTED NOT DETECTED Final   Parainfluenza Virus 1 NOT DETECTED NOT DETECTED Final   Parainfluenza Virus 2 NOT DETECTED NOT DETECTED Final   Parainfluenza Virus 3 NOT DETECTED NOT DETECTED Final   Parainfluenza Virus 4 NOT DETECTED NOT DETECTED Final   Respiratory  Syncytial Virus NOT DETECTED NOT DETECTED Final   Bordetella pertussis NOT DETECTED NOT DETECTED Final   Chlamydophila pneumoniae NOT DETECTED NOT DETECTED Final   Mycoplasma pneumoniae NOT DETECTED NOT DETECTED Final    Comment: Performed at [facility], [place] [address] [place], [place] [address]   Time coordinating discharge: 35 minutes  SIGNED:  Kerney Elbe, DO [facility] 12/23/2018, 12:54 PM Pager is on [place]  If 7PM-7AM, please contact night-coverage [email or web address] Password [email or web address]

## 2018-12-23 NOTE — Care Management (Signed)
Spoke w patyient's daughter [place]. She will be providing transport home and has an oxygen for transport she will be using. She states that patient lives in her own independent apartment.  Notified [place] that patient will dc today. Spoke w bedside nurse Lannette Donath, and updayed her that daughter will come at 4pm w O2 to transport home.  No other CM needs identified.

## 2018-12-25 LAB — NOVEL CORONAVIRUS, NAA (HOSP ORDER, SEND-OUT TO REF LAB; TAT 18-24 HRS): SARS-CoV-2, NAA: NOT DETECTED

## 2019-01-12 DEATH — deceased

## 2019-09-17 IMAGING — CR CHEST - 2 VIEW
2 series · 2 of 2 positions shown · non-contrast
Comparison: Chest radiographs 11/02/2018 and CT 11/06/2018

CLINICAL DATA: Weakness for 3 days.

EXAM:
CHEST - 2 VIEW

[w chest lat]
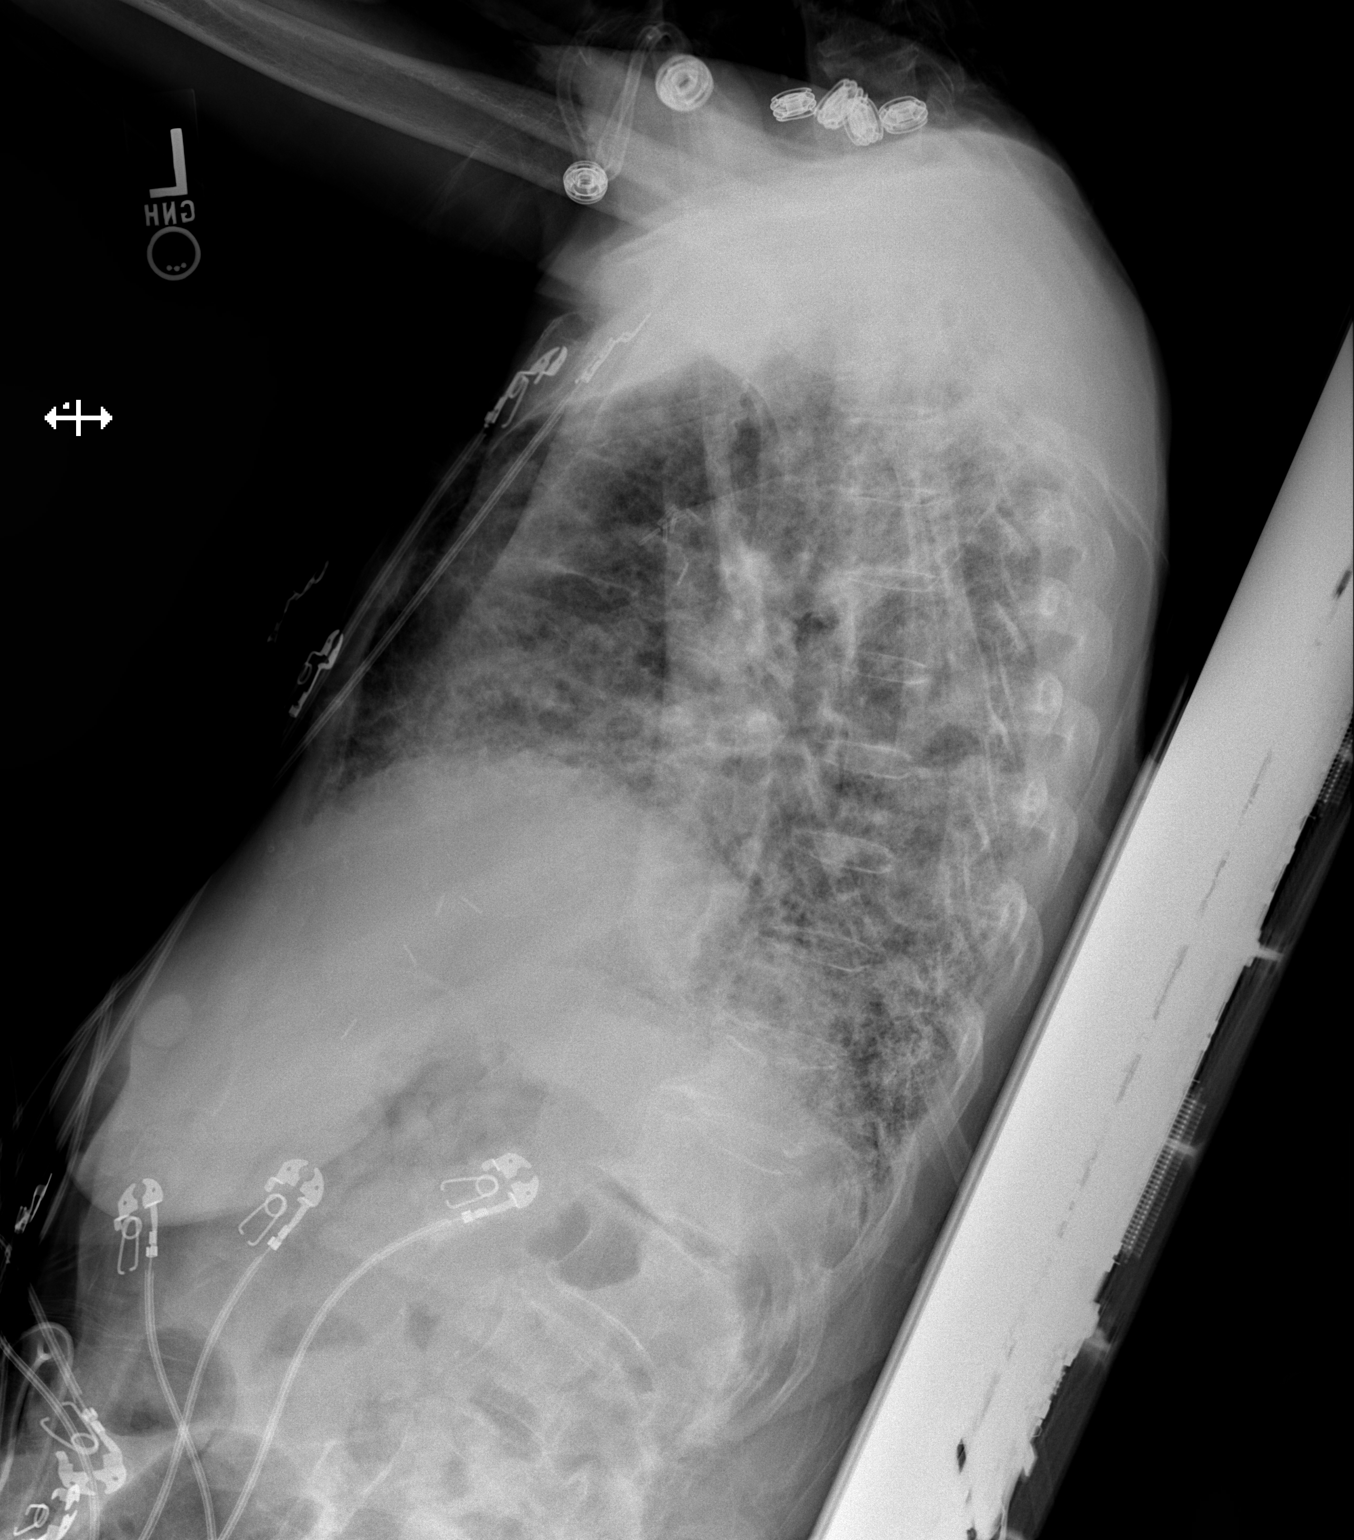

[x chest ap]
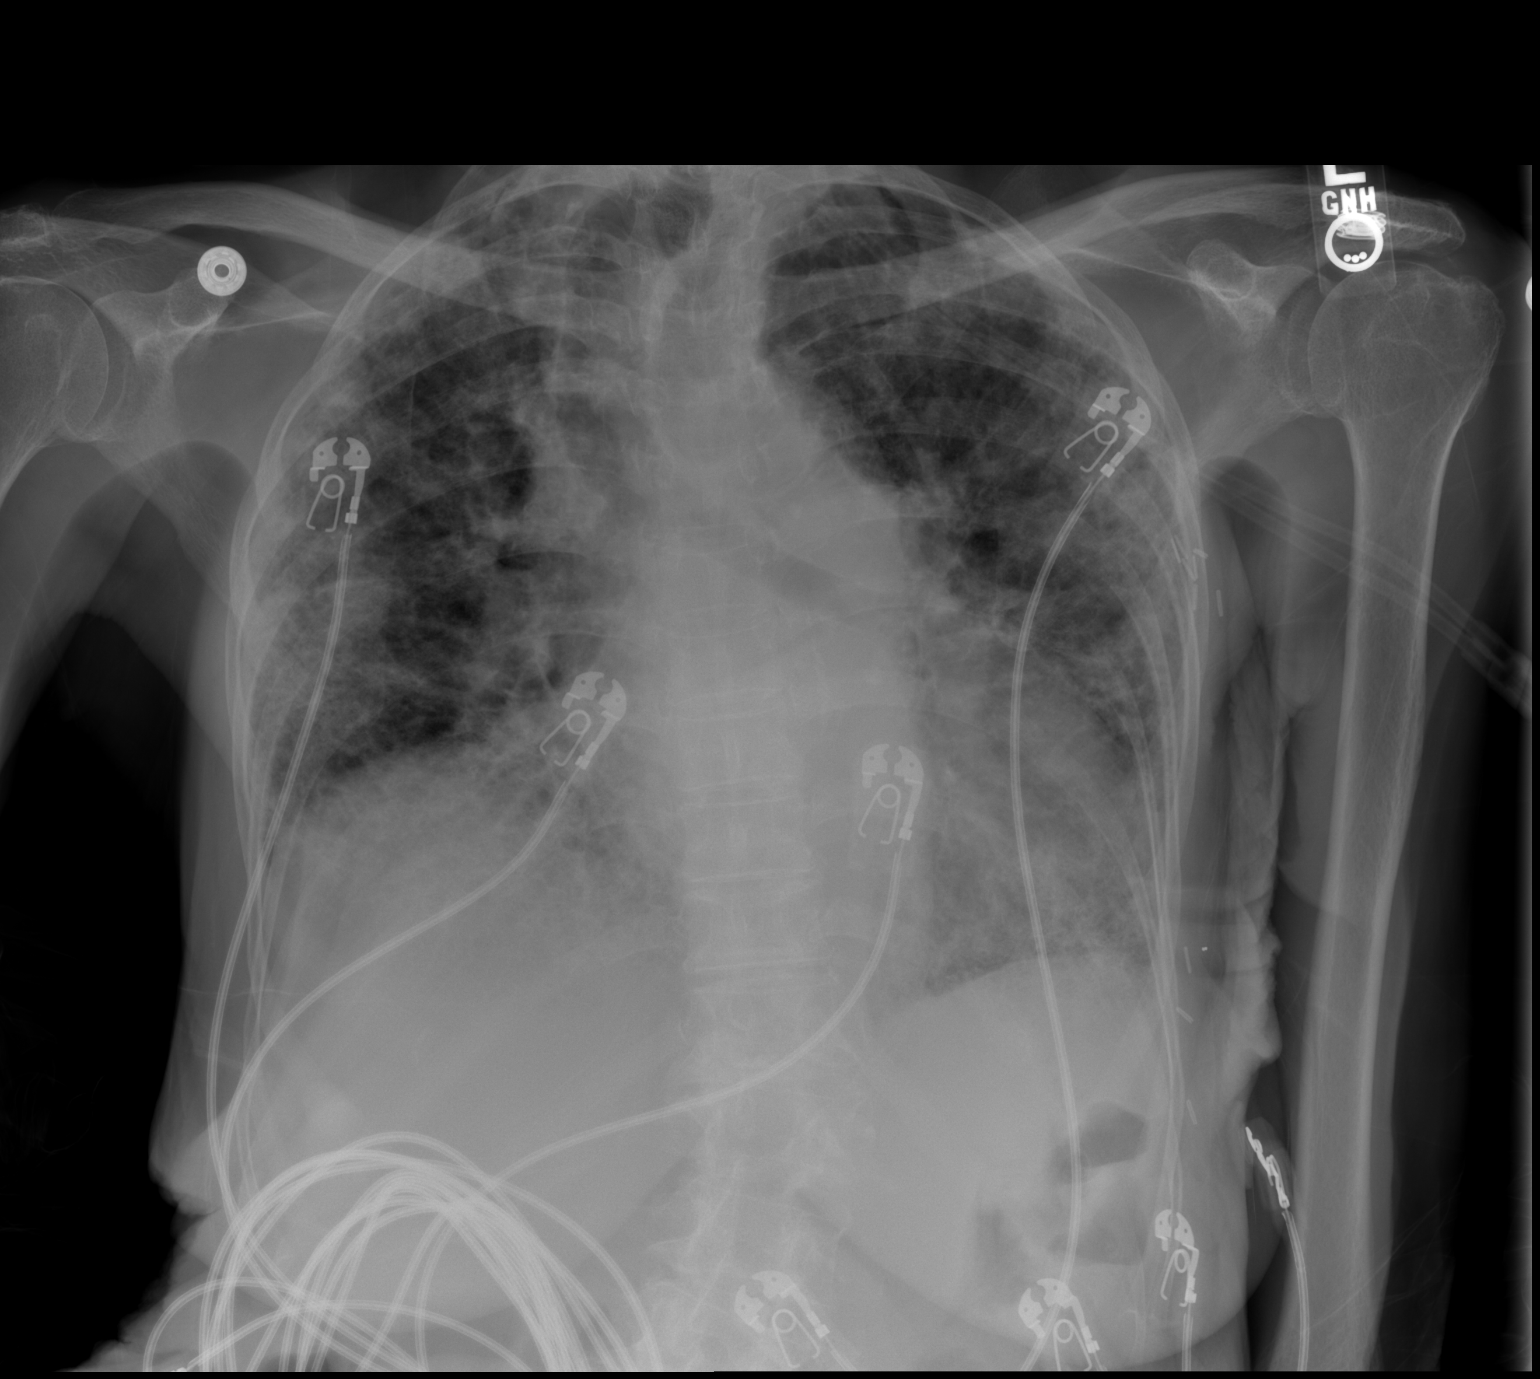

[2 of 2 positions shown; findings below may reference images not displayed]

FINDINGS: The cardiomediastinal silhouette is unchanged with mild cardiomegaly
again noted. There is chronic elevation of the right hemidiaphragm.
Lung volumes are unchanged with similar appearance of extensive
chronic interstitial lung disease compared to the prior radiographs.
No definite acute airspace consolidation, pleural effusion,
pneumothorax is identified. No acute osseous abnormality is seen.
IMPRESSION: Chronic interstitial lung disease without evidence of acute
abnormality.

## 2019-10-12 IMAGING — DX PORTABLE CHEST - 1 VIEW
1 series · 1 of 1 positions shown · non-contrast
Comparison: 12/20/2018

CLINICAL DATA: Shortness of Breath

EXAM:
PORTABLE CHEST 1 VIEW

[chest ap]
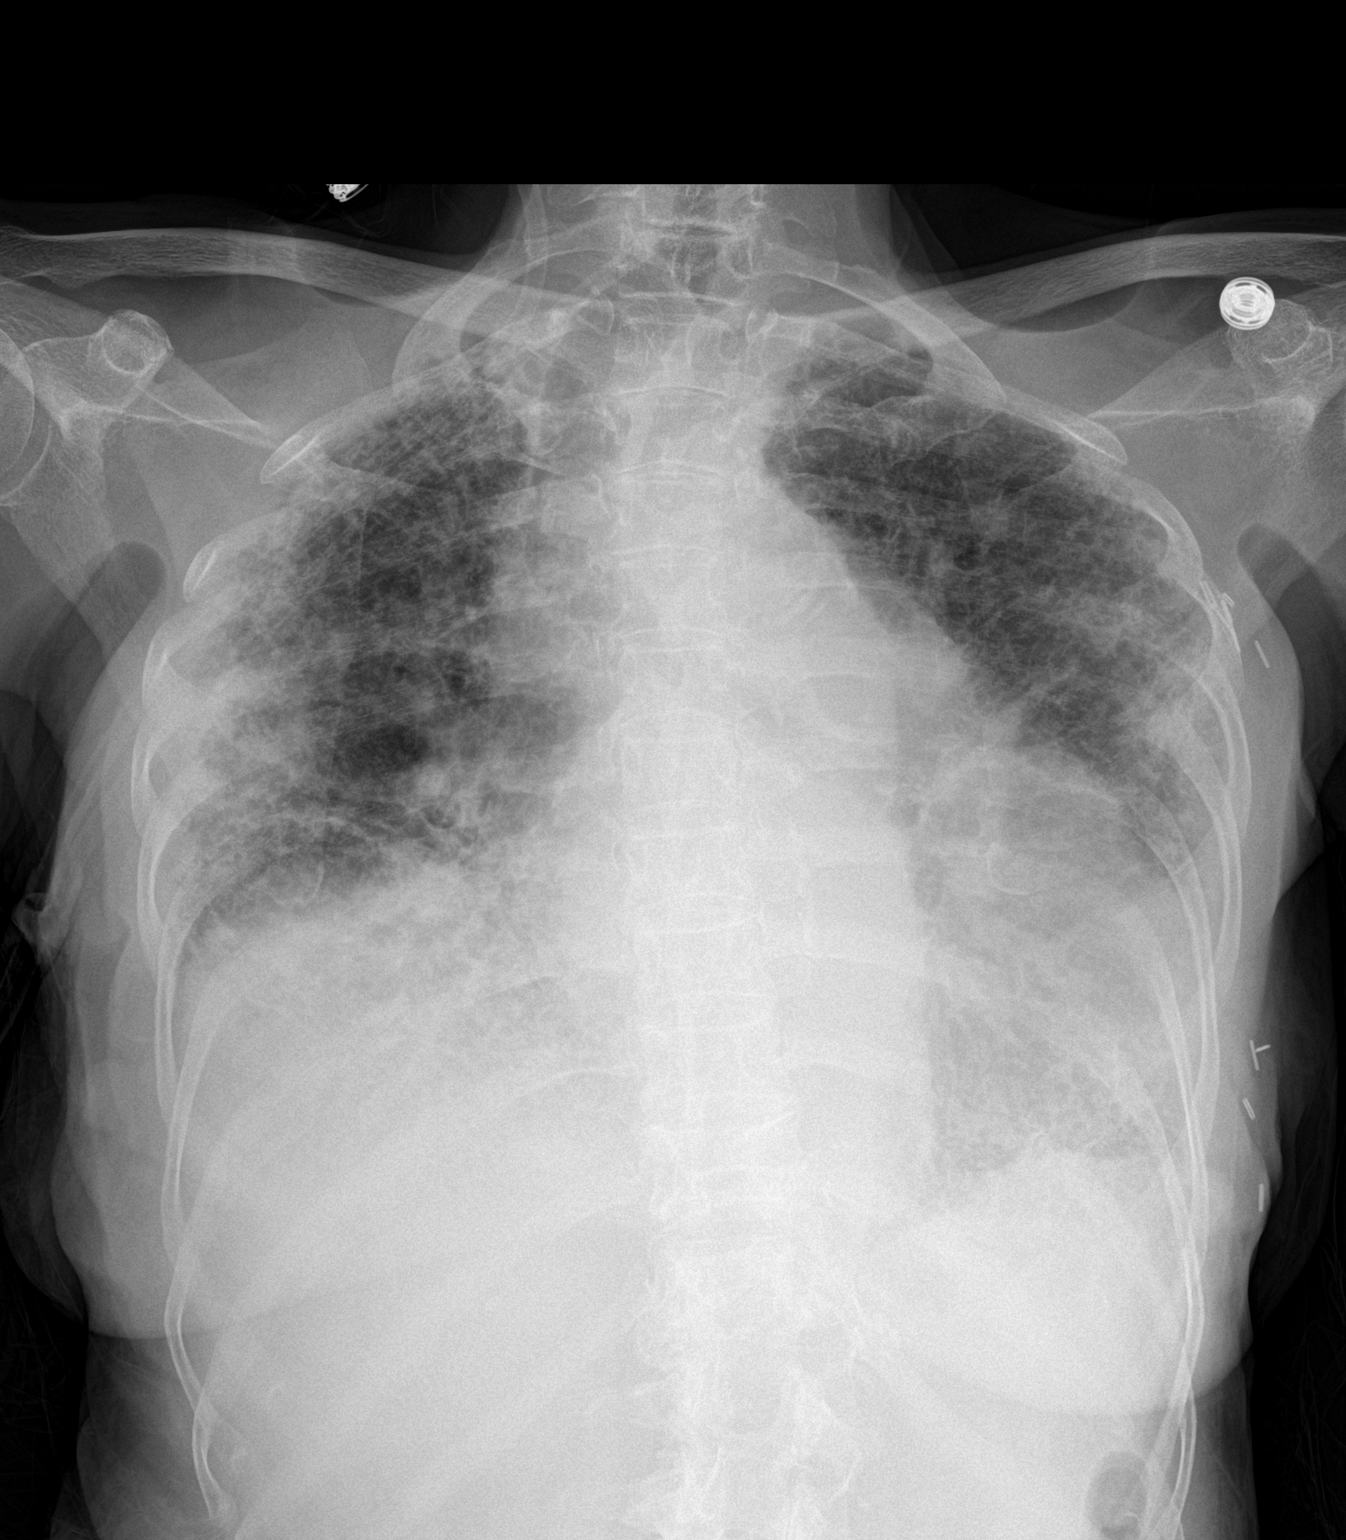

[1 of 1 positions shown; findings below may reference images not displayed]

FINDINGS: Advanced chronic interstitial changes/fibrosis throughout the lungs.
Cardiomegaly. Chronic elevation of the right hemidiaphragm. No
definite acute process although it is difficult to completely
exclude with severity of chronic disease. No acute bony abnormality.
IMPRESSION: Advanced chronic interstitial lung disease/fibrosis. No definite
acute process.

Cardiomegaly.

No change.
